# Patient Record
Sex: Male | Born: 1970 | Race: Black or African American | Hispanic: No | Marital: Single | Smoking: Former smoker
Health system: Southern US, Community
[De-identification: ages and names within clinical notes are randomized; demographics above are authoritative.]

## PROBLEM LIST (undated history)

## (undated) DIAGNOSIS — K7689 Other specified diseases of liver: Secondary | ICD-10-CM

## (undated) DIAGNOSIS — K219 Gastro-esophageal reflux disease without esophagitis: Secondary | ICD-10-CM

## (undated) DIAGNOSIS — T7840XA Allergy, unspecified, initial encounter: Secondary | ICD-10-CM

## (undated) DIAGNOSIS — K31A0 Gastric intestinal metaplasia, unspecified: Secondary | ICD-10-CM

## (undated) DIAGNOSIS — I10 Essential (primary) hypertension: Secondary | ICD-10-CM

## (undated) DIAGNOSIS — K3189 Other diseases of stomach and duodenum: Secondary | ICD-10-CM

## (undated) DIAGNOSIS — I8500 Esophageal varices without bleeding: Secondary | ICD-10-CM

## (undated) HISTORY — DX: Esophageal varices without bleeding: I85.00

## (undated) HISTORY — PX: HERNIA REPAIR: SHX51

## (undated) HISTORY — DX: Essential (primary) hypertension: I10

## (undated) HISTORY — DX: Other diseases of stomach and duodenum: K31.89

## (undated) HISTORY — DX: Other specified diseases of liver: K76.89

## (undated) HISTORY — PX: COLONOSCOPY: SHX174

## (undated) HISTORY — DX: Allergy, unspecified, initial encounter: T78.40XA

## (undated) HISTORY — DX: Gastric intestinal metaplasia, unspecified: K31.A0

## (undated) HISTORY — PX: ESOPHAGOGASTRODUODENOSCOPY: SHX1529

---

## 2007-08-05 ENCOUNTER — Emergency Department (HOSPITAL_COMMUNITY): Admission: EM | Admit: 2007-08-05 | Discharge: 2007-08-05 | Payer: Self-pay | Admitting: Emergency Medicine

## 2008-07-13 ENCOUNTER — Emergency Department: Payer: Self-pay | Admitting: Emergency Medicine

## 2008-07-16 ENCOUNTER — Emergency Department: Payer: Self-pay

## 2008-09-11 ENCOUNTER — Emergency Department: Payer: Self-pay | Admitting: Internal Medicine

## 2009-05-28 ENCOUNTER — Emergency Department (HOSPITAL_COMMUNITY): Admission: EM | Admit: 2009-05-28 | Discharge: 2009-05-28 | Payer: Self-pay | Admitting: Emergency Medicine

## 2009-10-18 ENCOUNTER — Emergency Department (HOSPITAL_COMMUNITY): Admission: EM | Admit: 2009-10-18 | Discharge: 2009-10-19 | Payer: Self-pay | Admitting: Emergency Medicine

## 2010-10-27 LAB — POCT CARDIAC MARKERS: Myoglobin, poc: 67.3 ng/mL (ref 12–200)

## 2010-10-27 LAB — CBC
Hemoglobin: 15.5 g/dL (ref 13.0–17.0)
MCHC: 33 g/dL (ref 30.0–36.0)
Platelets: 578 10*3/uL — ABNORMAL HIGH (ref 150–400)
RBC: 5.01 MIL/uL (ref 4.22–5.81)

## 2010-10-27 LAB — COMPREHENSIVE METABOLIC PANEL
ALT: 57 U/L — ABNORMAL HIGH (ref 0–53)
Albumin: 4.1 g/dL (ref 3.5–5.2)
CO2: 27 mEq/L (ref 19–32)
Calcium: 9.5 mg/dL (ref 8.4–10.5)
Creatinine, Ser: 1.04 mg/dL (ref 0.4–1.5)
GFR calc Af Amer: >60 mL/min (ref >60)
GFR calc non Af Amer: >60 mL/min (ref >60)
Glucose, Bld: 97 mg/dL (ref 70–99)
Potassium: 3.9 mEq/L (ref 3.5–5.1)
Sodium: 138 mEq/L (ref 135–145)
Total Bilirubin: 0.8 mg/dL (ref 0.3–1.2)
Total Protein: 7.3 g/dL (ref 6.0–8.3)

## 2010-10-27 LAB — POCT I-STAT, CHEM 8
Glucose, Bld: 91 mg/dL (ref 70–99)
Hemoglobin: 16.7 g/dL (ref 13.0–17.0)
Potassium: 3.8 mEq/L (ref 3.5–5.1)
Sodium: 140 mEq/L (ref 135–145)

## 2011-04-07 ENCOUNTER — Emergency Department (HOSPITAL_COMMUNITY): Payer: Self-pay

## 2011-04-07 ENCOUNTER — Observation Stay (HOSPITAL_COMMUNITY)
Admission: EM | Admit: 2011-04-07 | Discharge: 2011-04-09 | Disposition: A | Discharge status: Home / Self Care | Payer: Self-pay | Attending: Internal Medicine | Admitting: Internal Medicine

## 2011-04-07 DIAGNOSIS — F17200 Nicotine dependence, unspecified, uncomplicated: Secondary | ICD-10-CM | POA: Insufficient documentation

## 2011-04-07 DIAGNOSIS — F1010 Alcohol abuse, uncomplicated: Secondary | ICD-10-CM | POA: Insufficient documentation

## 2011-04-07 DIAGNOSIS — R197 Diarrhea, unspecified: Secondary | ICD-10-CM | POA: Insufficient documentation

## 2011-04-07 DIAGNOSIS — F1210 Cannabis abuse, uncomplicated: Secondary | ICD-10-CM | POA: Insufficient documentation

## 2011-04-07 DIAGNOSIS — R0602 Shortness of breath: Secondary | ICD-10-CM | POA: Insufficient documentation

## 2011-04-07 DIAGNOSIS — R079 Chest pain, unspecified: Principal | ICD-10-CM | POA: Insufficient documentation

## 2011-04-07 DIAGNOSIS — Z8249 Family history of ischemic heart disease and other diseases of the circulatory system: Secondary | ICD-10-CM | POA: Insufficient documentation

## 2011-04-07 DIAGNOSIS — K219 Gastro-esophageal reflux disease without esophagitis: Secondary | ICD-10-CM | POA: Insufficient documentation

## 2011-04-07 LAB — DIFFERENTIAL
Basophils Relative: 0 % (ref 0–1)
Eosinophils Relative: 2 % (ref 0–5)
Lymphocytes Relative: 38 % (ref 12–46)
Lymphs Abs: 3.5 10*3/uL (ref 0.7–4.0)
Monocytes Absolute: 0.7 10*3/uL (ref 0.1–1.0)
Neutrophils Relative %: 52 % (ref 43–77)

## 2011-04-07 LAB — BASIC METABOLIC PANEL
BUN: 11 mg/dL (ref 6–23)
CO2: 31 mEq/L (ref 19–32)
Calcium: 9.5 mg/dL (ref 8.4–10.5)
Chloride: 101 mEq/L (ref 96–112)
GFR calc non Af Amer: >60 mL/min (ref >60)
Potassium: 3.9 mEq/L (ref 3.5–5.1)
Sodium: 138 mEq/L (ref 135–145)

## 2011-04-07 LAB — CBC
MCH: 31 pg (ref 26.0–34.0)
MCHC: 34.6 g/dL (ref 30.0–36.0)
MCV: 89.7 fL (ref 78.0–100.0)
RBC: 5.16 MIL/uL (ref 4.22–5.81)
WBC: 9.1 10*3/uL (ref 4.0–10.5)

## 2011-04-08 LAB — CK TOTAL AND CKMB (NOT AT ARMC)
CK, MB: 2.9 ng/mL (ref 0.3–4.0)
Total CK: 264 U/L — ABNORMAL HIGH (ref 7–232)

## 2011-04-08 LAB — HEPATIC FUNCTION PANEL
Albumin: 3.2 g/dL — ABNORMAL LOW (ref 3.5–5.2)
Alkaline Phosphatase: 43 U/L (ref 39–117)
Bilirubin, Direct: <0.1 mg/dL (ref 0.0–0.3)
Total Protein: 6.6 g/dL (ref 6.0–8.3)

## 2011-04-08 LAB — TROPONIN I: Troponin I: <0.3 ng/mL (ref <0.30)

## 2011-04-08 LAB — MAGNESIUM: Magnesium: 2 mg/dL (ref 1.5–2.5)

## 2011-04-08 LAB — BASIC METABOLIC PANEL
BUN: 9 mg/dL (ref 6–23)
Calcium: 9 mg/dL (ref 8.4–10.5)
Creatinine, Ser: 1.01 mg/dL (ref 0.50–1.35)
GFR calc Af Amer: >60 mL/min (ref >60)
Glucose, Bld: 94 mg/dL (ref 70–99)
Sodium: 140 mEq/L (ref 135–145)

## 2011-04-08 LAB — CARDIAC PANEL(CRET KIN+CKTOT+MB+TROPI)
Relative Index: 1.2 (ref 0.0–2.5)
Total CK: 242 U/L — ABNORMAL HIGH (ref 7–232)

## 2011-04-08 LAB — CBC
HCT: 44.6 % (ref 39.0–52.0)
MCHC: 33.6 g/dL (ref 30.0–36.0)
MCV: 90.1 fL (ref 78.0–100.0)
Platelets: 479 10*3/uL — ABNORMAL HIGH (ref 150–400)
RBC: 4.95 MIL/uL (ref 4.22–5.81)
WBC: 9.1 10*3/uL (ref 4.0–10.5)

## 2011-04-08 LAB — LIPID PANEL
Cholesterol: 173 mg/dL (ref 0–200)
Cholesterol: 177 mg/dL (ref 0–200)
HDL: 65 mg/dL (ref >39)
LDL Cholesterol: 95 mg/dL (ref 0–99)
Total CHOL/HDL Ratio: 3.1 RATIO
Triglycerides: 157 mg/dL — ABNORMAL HIGH (ref <150)
Triglycerides: 87 mg/dL (ref <150)
VLDL: 17 mg/dL (ref 0–40)

## 2011-04-08 LAB — RAPID URINE DRUG SCREEN, HOSP PERFORMED
Cocaine: NOT DETECTED
Tetrahydrocannabinol: POSITIVE — AB

## 2011-04-09 ENCOUNTER — Observation Stay (HOSPITAL_COMMUNITY): Payer: Self-pay

## 2011-04-09 MED ORDER — TECHNETIUM TC 99M TETROFOSMIN IV KIT
10.0000 | PACK | Freq: Once | INTRAVENOUS | Status: AC | PRN
  Administered 2011-04-09: 10 via INTRAVENOUS

## 2011-04-09 MED ORDER — TECHNETIUM TC 99M TETROFOSMIN IV KIT
30.0000 | PACK | Freq: Once | INTRAVENOUS | Status: AC | PRN
  Administered 2011-04-09: 30 via INTRAVENOUS

## 2011-04-09 NOTE — H&P (Signed)
NAME:  William Fuller, BISTLINE NO.:  [id number]  MEDICAL RECORD NO.:  [id number]  LOCATION:  [facility]                         FACILITY:  [facility]  PHYSICIAN:  Eduard Clos, MDDATE OF BIRTH:  1970/11/10  DATE OF ADMISSION:  04/07/2011 DATE OF DISCHARGE:                             HISTORY & PHYSICAL   PRIMARY CARE PHYSICIAN:  Unassigned.  CHIEF COMPLAINT:  Chest pain.  HISTORY OF PRESENT ILLNESS:  A 40 year old male with known significant past medical history, has had a cardiac cath in 2006, as per the patient, was normal, presenting with a complaint of chest pain.  The patient has had chest pain started today morning around 10 o'clock which was more on the right side which was radiating to his left of his chest. The chest pain initially was pressure like, later became more shooting type associated with mild shortness of breath and denies any nausea or diaphoresis.  Denies any cough or phlegm or fever or chills.  In the ER, the patient had cardiac enzymes and EKG which at this time does not show anything acute.  The patient has been admitted for further workup.  As the patient did have a cardiac cath in 2006, the patient did call a cardiologist on call, but at this time, feel the patient can be admitted by hospitalist.  The patient initially stated that he has been having some diarrhea for the last 2 days, multiple episodes.  Denies any blood in the diarrhea. Along with it, he also had some belching episodes.  No nausea or vomiting.  Denies any dizziness, loss of function, headache, visual symptoms, any focal deficit.  PAST MEDICAL HISTORY:  Nothing significant except for a cardiac cath in 2006, which the patient states was normal.  MEDICATIONS PRIOR TO ADMISSION:  Zantac.  ALLERGIES:  No known drug allergies.  FAMILY HISTORY:  Positive for his brother having MI at age 64 and his younger brother dying at age 62 from enlarged heart.  Mother had  breast cancer.  SOCIAL HISTORY:  The patient smokes cigarettes, drinks beer 2-3 cans every other day, occasionally uses marijuana.  REVIEW OF SYSTEMS:  As per the history of presenting illness, nothing else significant.  PHYSICAL EXAMINATION:  GENERAL:  The patient examined at bedside, not in acute distress. VITAL SIGNS:  Blood pressure 135/81, pulse 84 per minute, temperature 98.6, respirations 18 per minute, O2 sat 99%. HEENT:  Anicteric.  No pallor.  No discharge from ears, eyes, nose, or mouth. CHEST:  Bilateral air entry present.  No rhonchi, no crepitation. HEART:  S1, S2 heard. ABDOMEN:  Soft, nontender.  Bowel sounds heard. CNS:  The patient is alert, awake, oriented to time, place, and person. Moves upper and lower extremities, 5/5. EXTREMITIES:  Peripheral pulses felt.  No edema.  LABORATORY DATA:  EKG shows normal sinus rhythm with nonspecific ST-T changes which is comparable to the old EKG which was done on October 18, 2009, heart rate is around 66 beats per minute.  Chest x-ray shows no active disease.  CBC; WBC is 9.1, hemoglobin is 16, hematocrit is 46.3, platelets 198.  Basic metabolic panel; sodium 138, potassium 3.9, chloride 101, carbon dioxide 31, glucose 96, BUN  11, creatinine 0.9, calcium 9.5.  Troponin I 0.01.  ASSESSMENT: 1. Chest pain, rule out acute coronary syndrome. 2. Diarrhea. 3. Tobacco abuse and alcohol abuse and marijuana abuse. 4. History of cardiac catheterization in 2006, which was normal as per     the patient.  PLAN: 1. At this time, we will admit the patient to telemetry. 2. For his chest pain, at this time, the patient is chest pain free.     We will keep the patient on p.r.n. nitroglycerin and continue with     aspirin and Protonix.  I am going to check a lipase and a D-dimer     along with LFTs.  We will get a 2-D echo and further recommendation     based on test order and clinical course.     Eduard Clos,  MD     ANK/MEDQ  D:  04/07/2011  T:  04/07/2011  Job:  [id number]  Electronically Signed by Midge Minium MD on 04/09/2011 06:51:33 AM

## 2011-04-12 NOTE — Discharge Summary (Signed)
  NAMEJONATHEN, RATHMAN             ACCOUNT NO.:  [id number]  MEDICAL RECORD NO.:  [id number]  LOCATION:  2028                         FACILITY:  [facility]  PHYSICIAN:  Lonia Blood, M.D.       DATE OF BIRTH:  [birth year]  DATE OF ADMISSION:  04/07/2011 DATE OF DISCHARGE:  04/09/2011                              DISCHARGE SUMMARY   PRIMARY CARE PHYSICIAN:  This patient has been referred to [facility].  DISCHARGE DIAGNOSES: 1. Chest pain, probably musculoskeletal versus gastroesophageal reflux     disease, resolved. 2. Reported history of coronary artery disease - current Myoview test     was negative for inducible ischemia. 3. Gastroesophageal reflux disease. 4. Tobacco abuse.  DISCHARGE MEDICATIONS: 1. Ranitidine 150 mg daily. 2. Multivitamin one daily.  CONDITION ON DISCHARGE:  Mr. Kresse was discharged in good condition, alert, oriented, and in no acute distress, chest pain free.  He will follow up with [facility].  PROCEDURE DURING THIS ADMISSION:  The patient underwent Myoview stress test which showed no inducible ischemia.  CONSULTATION DURING THIS ADMISSION:  The patient was in consultation by [facility].  HISTORY AND PHYSICAL:  Refer to dictated H and P done by Dr. Toniann Fail.  HOSPITAL COURSE:  Mr. Oommen is a 40 year old gentleman with reported personal history of coronary artery disease who presented to the emergency room with some atypical chest pain.  He was placed on observation.  He had telemetry monitoring, and cardiac enzyme checking. There was no evidence for myocardial infarction.  The chest pain resolved.  He was seen in consultation on April 08, 2011, by [facility] and later on on April 09, 2011, underwent a Myoview stress test.  The results of the Myoview stress test were negative for inducible ischemia with a measured ejection fraction of 50%.  The patient's chest pain could  probably be attributed to gastroesophageal reflux disease, and he was later discharged home in good condition with primary care followup at [facility].     Lonia Blood, M.D.     SL/MEDQ  D:  04/10/2011  T:  04/10/2011  Job:  [id number]  Electronically Signed by Lonia Blood M.D. on 04/12/2011 03:26:13 PM

## 2011-04-18 NOTE — Consult Note (Signed)
NAME:  William Fuller, William Fuller NO.:  [id number]  MEDICAL RECORD NO.:  [id number]  LOCATION:                                 FACILITY:  PHYSICIAN:  Landry Corporal, MDDATE OF BIRTH:  12/06/1970  DATE OF CONSULTATION: DATE OF DISCHARGE:                                CONSULTATION   CHIEF COMPLAINT:  Chest pain.  HISTORY OF PRESENT ILLNESS:  William Fuller is a 40 year old male who moved to the [place] area in 2008 from [place].  In 2006 in [place], he was admitted to an [facility] with chest pain.  According to the patient's history, he did have a heart attack, probably by enzymes.  He said he was seen by cardiologist and had a catheterization but no angioplasty or stents were placed.  He was told that the catheterization was "okay."  He was put on medication including Plavix after his heart catheterization.  He followed up with cardiologist a couple of times in [place] since then.  He was also admitted once or twice to the emergency room in [place] with palpitations.  He was not kept overnight then.  He moved to [facility] in 2008 and has not seen a physician regularly.  He is not on medications now.  He has been doing well from cardiac standpoint until yesterday morning when he got up to go to work and noted some chest tightness.  He took a Zantac without relief.  He then had some sharp left-sided chest pain.  He came to the emergency room and was admitted by the hospital service for further evaluation.  His EKG showed no acute changes, and initial enzymes were negative.  We were asked to see him for consult and further evaluation. He is currently pain free.  PAST MEDICAL HISTORY:  Unremarkable for other serious medical problems, he was put on a statin in 2006 but he does not take that now.  He has no known drug allergies.  SOCIAL HISTORY:  He is single.  He lives alone.  He smokes less than half a pack a day.  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  He drinks alcohol a couple of times a week, consisting of beer.  He does smoke occasional marijuana but denies cocaine use.  FAMILY HISTORY:  Remarkable that his brother had an MI in his early 30s and had a stent placed twice to the "left side of his heart."  His brother is still alive.  His mother has a history of breast cancer.  REVIEW OF SYSTEMS:  Essentially unremarkable except for noted above, he has had some diarrhea but no fever or chills.  He does note that when he had his catheterization in 2006 that the cardiologist told him it is possible that his heart attack was from "stress."  He denies being under any undue stress at this time.  PHYSICAL EXAMINATION:  VITAL SIGNS:  Blood pressure 122/66, pulse 52, temperature 98. GENERAL:  He is a well-developed, well-nourished [place] male, in no acute distress. HEENT:  Normocephalic, atraumatic.  Extraocular movements are intact. Sclerae nonicteric.  Conjunctivae within normal limits. NECK:  Without JVD or bruit. CHEST:  Clear to auscultation and percussion. CARDIAC:  Regular rate and rhythm without obvious murmur, rub, or gallop.  Normal S1 and S2. ABDOMEN:  Nontender.  No hepatosplenomegaly.  No bruits. EXTREMITIES:  Without edema.  Distal pulses are 3+/4 bilaterally. NEUROLOGIC:  Grossly intact.  He is awake, alert, and oriented and cooperative, moves all extremities without obvious deficit. SKIN:  Cool and dry.  LABORATORY DATA:  EKG shows sinus rhythm with sinus brady without any acute changes. White count 9.1, hemoglobin 15, hematocrit 44.6, platelets 479.  Sodium 140, potassium 3.7, BUN 9, creatinine 1.01.  Troponin is negative x3. Cholesterol total was 177 with an HDL of 65 and an LDL of 95.  LFTs are normal.  His D-dimer is normal.  IMPRESSION: 1. Chest pain, worrisome for unstable angina. 2. History of myocardial infarction in 2006, possibly secondary to     coronary  spasm with apparently no significant coronary disease at     catheterization at that time. 3. Strong family history of coronary disease with sibling who has had     two left anterior descending stents placed in his 69s. 4. History of smoking, less than half pack a day.  PLAN:  The patient will be evaluated by cardiologist further today and recommendations are to follow regarding exercise Myoview versus diagnostic catheterization.     William Fuller, P.A.   ______________________________ Landry Corporal, MD    LKK/MEDQ  D:  04/08/2011  T:  04/08/2011  Job:  [id number]  Electronically Signed by Corine Shelter P.A. on 04/10/2011 04:53:09 PM Electronically Signed by Bryan Lemma MD on 04/18/2011 02:20:25 PM

## 2012-08-18 ENCOUNTER — Emergency Department (HOSPITAL_COMMUNITY)
Admission: EM | Admit: 2012-08-18 | Discharge: 2012-08-18 | Disposition: A | Discharge status: Home / Self Care | Payer: Self-pay | Attending: Emergency Medicine | Admitting: Emergency Medicine

## 2012-08-18 ENCOUNTER — Encounter (HOSPITAL_COMMUNITY): Payer: Self-pay | Admitting: Emergency Medicine

## 2012-08-18 DIAGNOSIS — F17200 Nicotine dependence, unspecified, uncomplicated: Secondary | ICD-10-CM | POA: Insufficient documentation

## 2012-08-18 DIAGNOSIS — K0889 Other specified disorders of teeth and supporting structures: Secondary | ICD-10-CM

## 2012-08-18 DIAGNOSIS — K219 Gastro-esophageal reflux disease without esophagitis: Secondary | ICD-10-CM | POA: Insufficient documentation

## 2012-08-18 DIAGNOSIS — K089 Disorder of teeth and supporting structures, unspecified: Secondary | ICD-10-CM | POA: Insufficient documentation

## 2012-08-18 HISTORY — DX: Gastro-esophageal reflux disease without esophagitis: K21.9

## 2012-08-18 MED ORDER — OXYCODONE-ACETAMINOPHEN 5-325 MG PO TABS
1.0000 | ORAL_TABLET | Freq: Once | ORAL | Status: AC
  Administered 2012-08-18: 1 via ORAL
  Filled 2012-08-18: qty 1

## 2012-08-18 MED ORDER — PERCOCET 5-325 MG PO TABS: 1.0000 | ORAL_TABLET | Freq: Four times a day (QID) | ORAL | Status: DC | PRN

## 2012-08-18 NOTE — ED Provider Notes (Signed)
Medical screening examination/treatment/procedure(s) were performed by non-physician practitioner and as supervising physician I was immediately available for consultation/collaboration.  Jasmine Awe, MD 08/18/12 [phone]

## 2012-08-18 NOTE — ED Notes (Signed)
Pt c/o tooth pain on left side.  Not sure which tooth is causing the pain.

## 2012-08-18 NOTE — ED Notes (Signed)
Pt discharged.Vital signs stable .GCS 15 

## 2012-08-18 NOTE — ED Provider Notes (Signed)
History     CSN: [id number]  Arrival date & time 08/18/12  1944   First MD Initiated Contact with Patient 08/18/12 2013      Chief Complaint  Patient presents with  . Dental Pain    (Consider location/radiation/quality/duration/timing/severity/associated sxs/prior treatment) HPI Comments: Patient presents to the emergency department with a dental complaint. Symptoms began 2-3 days ago. The patient has tried to alleviate pain with motrin.  Pain rated at a 10/10, characterized as throbbing in nature and located left side of mouth (unable to tell if upper or lower). Patient denies fever, night sweats, chills, difficulty swallowing or opening mouth, SOB, nuchal rigidity or decreased ROM of neck.  Patient does not have a dentist and requests a resource guide at discharge.   The history is provided by the patient.    Past Medical History  Diagnosis Date  . GERD (gastroesophageal reflux disease)     History reviewed. No pertinent past surgical history.  History reviewed. No pertinent family history.  History  Substance Use Topics  . Smoking status: Current Every Day Smoker  . Smokeless tobacco: Not on file  . Alcohol Use: Yes     Comment: occasional      Review of Systems  All other systems reviewed and are negative.    Allergies  Review of patient's allergies indicates no known allergies.  Home Medications   Current Outpatient Rx  Name  Route  Sig  Dispense  Refill  . BC HEADACHE POWDER PO   Oral   Take 1 packet by mouth 2 (two) times daily as needed. For headache         . OVER THE COUNTER MEDICATION   Oral   Take 1 tablet by mouth daily. Vitamin for prostrate health         . RANITIDINE HCL 150 MG PO TABS   Oral   Take 150 mg by mouth 2 (two) times daily as needed. For acid reflux           BP 136/82  Pulse 68  Temp 98.7 F (37.1 C) (Oral)  Resp 14  SpO2 96%  Physical Exam  Nursing note and vitals reviewed. Constitutional: He is oriented to  person, place, and time. He appears well-developed and well-nourished. No distress.  HENT:  Head: Normocephalic and atraumatic. No trismus in the jaw.  Mouth/Throat: Uvula is midline, oropharynx is clear and moist and mucous membranes are normal. No dental abscesses or uvula swelling. No oropharyngeal exudate, posterior oropharyngeal edema, posterior oropharyngeal erythema or tonsillar abscesses.       Good dental hygiene. Pt able to open and close mouth with out difficulty. Airway intact. Uvula midline. No gingival swelling with tenderness over right upper and lower molars, but no gingival fluctuance. No swelling or tenderness of submental and submandibular regions.  Eyes: Conjunctivae normal and EOM are normal.  Neck: Normal range of motion and full passive range of motion without pain. Neck supple.  Cardiovascular: Normal rate and regular rhythm.   Pulmonary/Chest: Effort normal and breath sounds normal. No stridor. No respiratory distress. He has no wheezes.  Musculoskeletal: Normal range of motion.  Lymphadenopathy:       Head (right side): No submental, no submandibular, no tonsillar, no preauricular and no posterior auricular adenopathy present.       Head (left side): No submental, no submandibular, no tonsillar, no preauricular and no posterior auricular adenopathy present.    He has no cervical adenopathy.  Neurological: He is alert and  oriented to person, place, and time.  Skin: Skin is warm and dry. No rash noted. He is not diaphoretic.    ED Course  Procedures (including critical care time)  Labs Reviewed - No data to display No results found.   No diagnosis found.    MDM  Dental pain Patient with toothache.  No gross abscess no evidence of exposed pulp, abx not indicated at this time.  Exam unconcerning for Ludwig's angina or spread of infection.  Will treat with d pain medicine.  Urged patient to follow-up with dentist.  Strict return precautions discussed.           Jaci Carrel, [place] 08/18/12 2127

## 2012-09-29 ENCOUNTER — Encounter (HOSPITAL_COMMUNITY): Payer: Self-pay | Admitting: Emergency Medicine

## 2012-09-29 ENCOUNTER — Emergency Department (HOSPITAL_COMMUNITY)
Admission: EM | Admit: 2012-09-29 | Discharge: 2012-09-29 | Disposition: A | Discharge status: Home / Self Care | Payer: Self-pay | Attending: Emergency Medicine | Admitting: Emergency Medicine

## 2012-09-29 DIAGNOSIS — K219 Gastro-esophageal reflux disease without esophagitis: Secondary | ICD-10-CM | POA: Insufficient documentation

## 2012-09-29 DIAGNOSIS — F17200 Nicotine dependence, unspecified, uncomplicated: Secondary | ICD-10-CM | POA: Insufficient documentation

## 2012-09-29 DIAGNOSIS — Z79899 Other long term (current) drug therapy: Secondary | ICD-10-CM | POA: Insufficient documentation

## 2012-09-29 DIAGNOSIS — K089 Disorder of teeth and supporting structures, unspecified: Secondary | ICD-10-CM | POA: Insufficient documentation

## 2012-09-29 DIAGNOSIS — K0889 Other specified disorders of teeth and supporting structures: Secondary | ICD-10-CM

## 2012-09-29 MED ORDER — OXYCODONE-ACETAMINOPHEN 5-325 MG PO TABS
2.0000 | ORAL_TABLET | Freq: Once | ORAL | Status: AC
  Administered 2012-09-29: 2 via ORAL
  Filled 2012-09-29: qty 2

## 2012-09-29 MED ORDER — OXYCODONE-ACETAMINOPHEN 5-325 MG PO TABS: 1.0000 | ORAL_TABLET | Freq: Four times a day (QID) | ORAL | Status: DC | PRN

## 2012-09-29 MED ORDER — ONDANSETRON 4 MG PO TBDP
4.0000 mg | ORAL_TABLET | Freq: Once | ORAL | Status: AC
  Administered 2012-09-29: 4 mg via ORAL
  Filled 2012-09-29: qty 1

## 2012-09-29 MED ORDER — AMOXICILLIN 500 MG PO CAPS: 500.0000 mg | ORAL_CAPSULE | Freq: Three times a day (TID) | ORAL | Status: DC

## 2012-09-29 MED ORDER — ONDANSETRON HCL 4 MG PO TABS: 4.0000 mg | ORAL_TABLET | Freq: Four times a day (QID) | ORAL | Status: DC

## 2012-09-29 MED ORDER — AMOXICILLIN 500 MG PO CAPS
500.0000 mg | ORAL_CAPSULE | Freq: Once | ORAL | Status: AC
  Administered 2012-09-29: 500 mg via ORAL
  Filled 2012-09-29: qty 1

## 2012-09-29 NOTE — ED Provider Notes (Signed)
History     CSN: [id number]  Arrival date & time 09/29/12  0234   First MD Initiated Contact with Patient 09/29/12 [phone]      Chief Complaint  Patient presents with  . Dental Pain    (Consider location/radiation/quality/duration/timing/severity/associated sxs/prior treatment) HPI  Patient presents to the emergency department with a dental complaint. Symptoms began 4-5 days ago but he has been unable to sleep the past two days because of the severe pain to his left upper and bottom back molar. The patient has tried to alleviate pain with motrin. Pain rated at a 10/10, characterized as throbbing in nature and located left side of mouth (unable to tell if upper or lower). Patient denies fever, night sweats, chills, difficulty swallowing or opening mouth, SOB, nuchal rigidity or decreased ROM of neck. Patient does not have a dentist and requests a resource guide at discharge.   Past Medical History  Diagnosis Date  . GERD (gastroesophageal reflux disease)     History reviewed. No pertinent past surgical history.  Family History  Problem Relation Age of Onset  . Hypertension Other   . CAD Other     History  Substance Use Topics  . Smoking status: Current Every Day Smoker  . Smokeless tobacco: Not on file  . Alcohol Use: No      Review of Systems  All other systems reviewed and are negative.    Allergies  Review of patient's allergies indicates no known allergies.  Home Medications   Current Outpatient Rx  Name  Route  Sig  Dispense  Refill  . amoxicillin (AMOXIL) 500 MG capsule   Oral   Take 1 capsule (500 mg total) by mouth 3 (three) times daily.   21 capsule   0   . Aspirin-Salicylamide-Caffeine (BC HEADACHE POWDER PO)   Oral   Take 1 packet by mouth 2 (two) times daily as needed. For headache         . ondansetron (ZOFRAN) 4 MG tablet   Oral   Take 1 tablet (4 mg total) by mouth every 6 (six) hours.   12 tablet   0   . OVER THE COUNTER MEDICATION    Oral   Take 1 tablet by mouth daily. Vitamin for prostrate health         . oxyCODONE-acetaminophen (PERCOCET/ROXICET) 5-325 MG per tablet   Oral   Take 1 tablet by mouth every 6 (six) hours as needed for pain.   15 tablet   0   . PERCOCET 5-325 MG per tablet   Oral   Take 1 tablet by mouth every 6 (six) hours as needed for pain.   15 tablet   0     Dispense as written.   . ranitidine (ZANTAC) 150 MG tablet   Oral   Take 150 mg by mouth 2 (two) times daily as needed. For acid reflux           BP 134/88  Pulse 64  Temp(Src) 99.5 F (37.5 C) (Oral)  Resp 20  SpO2 99%  Physical Exam  Nursing note and vitals reviewed. Constitutional: He appears well-developed and well-nourished.  HENT:  Head: Normocephalic and atraumatic.  Good dental hygiene. Pt able to open and close mouth with out difficulty. Airway intact. Uvula midline. No gingival swelling with tenderness over right upper and lower molars, but no gingival fluctuance. No swelling or tenderness of submental and submandibular regions.    Eyes: Conjunctivae and EOM are normal. Pupils are equal,  round, and reactive to light.  Neck: Normal range of motion. Neck supple.  Cardiovascular: Normal rate and regular rhythm.   Pulmonary/Chest: Effort normal and breath sounds normal.    ED Course  Procedures (including critical care time)  Labs Reviewed - No data to display No results found.   1. Pain, dental       MDM  Dental pain  Patient with toothache. No gross abscess no evidence of exposed pulp, abx not indicated at this time. Exam unconcerning for Ludwig's angina or spread of infection. Will treat with d pain medicine. Urged patient to follow-up with dentist. Strict return precautions discussed.   Rx abx and pain medication  Pt has been advised of the symptoms that warrant their return to the ED. Patient has voiced understanding and has agreed to follow-up with the PCP or  specialist.         Dorthula Matas, PA 09/29/12 [phone]

## 2012-09-29 NOTE — ED Notes (Signed)
Pt is c/o toothache on the left side on the top and bottom  States this is the second day of pain

## 2012-09-29 NOTE — ED Provider Notes (Signed)
Medical screening examination/treatment/procedure(s) were performed by non-physician practitioner and as supervising physician I was immediately available for consultation/collaboration.  Olivia Mackie, MD 09/29/12 [phone]

## 2012-10-04 ENCOUNTER — Telehealth (HOSPITAL_COMMUNITY): Payer: Self-pay | Admitting: Emergency Medicine

## 2012-10-04 NOTE — ED Notes (Signed)
On call dentist calling for contact info on pt.  Home # provided.

## 2015-02-22 ENCOUNTER — Encounter (HOSPITAL_COMMUNITY): Payer: Self-pay | Admitting: *Deleted

## 2015-02-22 ENCOUNTER — Emergency Department (HOSPITAL_COMMUNITY)
Admission: EM | Admit: 2015-02-22 | Discharge: 2015-02-22 | Disposition: A | Discharge status: Home / Self Care | Payer: Self-pay | Attending: Emergency Medicine | Admitting: Emergency Medicine

## 2015-02-22 DIAGNOSIS — K921 Melena: Secondary | ICD-10-CM

## 2015-02-22 DIAGNOSIS — R195 Other fecal abnormalities: Secondary | ICD-10-CM | POA: Insufficient documentation

## 2015-02-22 DIAGNOSIS — Z720 Tobacco use: Secondary | ICD-10-CM | POA: Insufficient documentation

## 2015-02-22 DIAGNOSIS — Z79899 Other long term (current) drug therapy: Secondary | ICD-10-CM | POA: Insufficient documentation

## 2015-02-22 DIAGNOSIS — Z7982 Long term (current) use of aspirin: Secondary | ICD-10-CM | POA: Insufficient documentation

## 2015-02-22 DIAGNOSIS — K219 Gastro-esophageal reflux disease without esophagitis: Secondary | ICD-10-CM | POA: Insufficient documentation

## 2015-02-22 LAB — URINALYSIS, ROUTINE W REFLEX MICROSCOPIC
Bilirubin Urine: NEGATIVE
Glucose, UA: NEGATIVE mg/dL
Hgb urine dipstick: NEGATIVE
Ketones, ur: NEGATIVE mg/dL
Leukocytes, UA: NEGATIVE
Nitrite: NEGATIVE
Protein, ur: NEGATIVE mg/dL
Specific Gravity, Urine: 1.029 (ref 1.005–1.030)
Urobilinogen, UA: 1 mg/dL (ref 0.0–1.0)
pH: 6 (ref 5.0–8.0)

## 2015-02-22 LAB — COMPREHENSIVE METABOLIC PANEL
ALT: 23 U/L (ref 17–63)
AST: 29 U/L (ref 15–41)
Albumin: 3.7 g/dL (ref 3.5–5.0)
Alkaline Phosphatase: 40 U/L (ref 38–126)
Anion gap: 4 — ABNORMAL LOW (ref 5–15)
BUN: 9 mg/dL (ref 6–20)
CO2: 27 mmol/L (ref 22–32)
Calcium: 8.8 mg/dL — ABNORMAL LOW (ref 8.9–10.3)
Chloride: 108 mmol/L (ref 101–111)
Creatinine, Ser: 1.12 mg/dL (ref 0.61–1.24)
GFR calc Af Amer: >60 mL/min (ref >60)
GFR calc non Af Amer: >60 mL/min (ref >60)
Glucose, Bld: 99 mg/dL (ref 65–99)
Potassium: 4 mmol/L (ref 3.5–5.1)
Sodium: 139 mmol/L (ref 135–145)
Total Bilirubin: 0.6 mg/dL (ref 0.3–1.2)
Total Protein: 6.8 g/dL (ref 6.5–8.1)

## 2015-02-22 LAB — CBC WITH DIFFERENTIAL/PLATELET
Basophils Absolute: 0 10*3/uL (ref 0.0–0.1)
Basophils Relative: 1 % (ref 0–1)
Eosinophils Absolute: 0.1 10*3/uL (ref 0.0–0.7)
Eosinophils Relative: 2 % (ref 0–5)
HCT: 41.9 % (ref 39.0–52.0)
Hemoglobin: 14 g/dL (ref 13.0–17.0)
Lymphocytes Relative: 41 % (ref 12–46)
Lymphs Abs: 2.4 10*3/uL (ref 0.7–4.0)
MCH: 30 pg (ref 26.0–34.0)
MCHC: 33.4 g/dL (ref 30.0–36.0)
MCV: 89.7 fL (ref 78.0–100.0)
Monocytes Absolute: 0.5 10*3/uL (ref 0.1–1.0)
Monocytes Relative: 8 % (ref 3–12)
Neutro Abs: 2.9 10*3/uL (ref 1.7–7.7)
Neutrophils Relative %: 48 % (ref 43–77)
Platelets: 478 10*3/uL — ABNORMAL HIGH (ref 150–400)
RBC: 4.67 MIL/uL (ref 4.22–5.81)
RDW: 13.6 % (ref 11.5–15.5)
WBC: 5.9 10*3/uL (ref 4.0–10.5)

## 2015-02-22 LAB — LIPASE, BLOOD: Lipase: 17 U/L — ABNORMAL LOW (ref 22–51)

## 2015-02-22 LAB — POC OCCULT BLOOD, ED: Fecal Occult Bld: NEGATIVE

## 2015-02-22 MED ORDER — SODIUM CHLORIDE 0.9 % IV BOLUS (SEPSIS)
1000.0000 mL | Freq: Once | INTRAVENOUS | Status: AC
  Administered 2015-02-22: 1000 mL via INTRAVENOUS

## 2015-02-22 NOTE — ED Notes (Signed)
Pt reports blood in stool for 2 days prior to today. None noted today. Pt states that he has had hemorrhoids in the past. Reports some abdominal pain.

## 2015-02-22 NOTE — Discharge Instructions (Signed)
Return here as needed.  Follow-up with the GI Dr. provided °

## 2015-02-22 NOTE — ED Provider Notes (Signed)
CSN: [id number]     Arrival date & time 02/22/15  1009 History   First MD Initiated Contact with Patient 02/22/15 1020     Chief Complaint  Patient presents with  . Rectal Bleeding     (Consider location/radiation/quality/duration/timing/severity/associated sxs/prior Treatment) HPI Patient presents to the emergency department with blood in his stool over the last 2 days.  The patient states that he had a bowel movement 2 days ago that had some blood mixed in the stool.  Patient states that today.  Yesterday he noticed some as well, but today did not notice any patient states he has not had any abdominal pain, nausea, vomiting, weakness, dizziness, headache, blurred vision, back pain, neck pain, fever, cough, runny nose, sore throat, dysuria, incontinence, diarrhea, or syncope.  The patient states that he is had bleeding in his stool.  Previously, but not any significant abnormality of these were episodes associated with hemorrhoids Past Medical History  Diagnosis Date  . GERD (gastroesophageal reflux disease)    History reviewed. No pertinent past surgical history. Family History  Problem Relation Age of Onset  . Hypertension Other   . CAD Other    History  Substance Use Topics  . Smoking status: Current Every Day Smoker  . Smokeless tobacco: Not on file  . Alcohol Use: No    Review of Systems  All other systems negative except as documented in the HPI. All pertinent positives and negatives as reviewed in the HPI.=  Allergies  Review of patient's allergies indicates no known allergies.  Home Medications   Prior to Admission medications   Medication Sig Start Date End Date Taking? Authorizing Provider  Aspirin-Salicylamide-Caffeine (BC HEADACHE POWDER PO) Take 1 packet by mouth 2 (two) times daily as needed. For headache   Yes Historical Provider, MD  ranitidine (ZANTAC) 150 MG tablet Take 150 mg by mouth 2 (two) times daily as needed. For acid reflux   Yes Historical  Provider, MD  amoxicillin (AMOXIL) 500 MG capsule Take 1 capsule (500 mg total) by mouth 3 (three) times daily. Patient not taking: Reported on 02/22/2015 09/29/12   Delos Haring, PA-C  ondansetron (ZOFRAN) 4 MG tablet Take 1 tablet (4 mg total) by mouth every 6 (six) hours. Patient not taking: Reported on 02/22/2015 09/29/12   Delos Haring, PA-C  oxyCODONE-acetaminophen (PERCOCET/ROXICET) 5-325 MG per tablet Take 1 tablet by mouth every 6 (six) hours as needed for pain. Patient not taking: Reported on 02/22/2015 09/29/12   Delos Haring, PA-C  PERCOCET 5-325 MG per tablet Take 1 tablet by mouth every 6 (six) hours as needed for pain. Patient not taking: Reported on 02/22/2015 08/18/12   Lisette Paz, PA-C   BP 113/80 mmHg  Pulse 46  Temp(Src) 98.5 F (36.9 C) (Oral)  Resp 16  SpO2 100% Physical Exam  Constitutional: He is oriented to person, place, and time. He appears well-developed and well-nourished. No distress.  HENT:  Head: Normocephalic and atraumatic.  Mouth/Throat: Oropharynx is clear and moist.  Eyes: Pupils are equal, round, and reactive to light.  Neck: Normal range of motion. Neck supple.  Cardiovascular: Normal rate, regular rhythm and normal heart sounds.  Exam reveals no gallop and no friction rub.   No murmur heard. Pulmonary/Chest: Effort normal and breath sounds normal. No respiratory distress.  Abdominal: Soft. Bowel sounds are normal. He exhibits no distension. There is no tenderness.  Genitourinary: Rectal exam shows no external hemorrhoid, no internal hemorrhoid, no fissure, no mass, no tenderness and anal tone  normal. Guaiac negative stool.  Neurological: He is alert and oriented to person, place, and time. He exhibits normal muscle tone. Coordination normal.  Skin: Skin is warm and dry. No rash noted. No erythema.  Psychiatric: He has a normal mood and affect. His behavior is normal.  Nursing note and vitals reviewed.   ED Course  Procedures (including  critical care time) Labs Review Labs Reviewed  COMPREHENSIVE METABOLIC PANEL - Abnormal; Notable for the following:    Calcium 8.8 (*)    Anion gap 4 (*)    All other components within normal limits  CBC WITH DIFFERENTIAL/PLATELET - Abnormal; Notable for the following:    Platelets 478 (*)    All other components within normal limits  LIPASE, BLOOD - Abnormal; Notable for the following:    Lipase 17 (*)    All other components within normal limits  URINALYSIS, ROUTINE W REFLEX MICROSCOPIC (NOT AT [facility])  POC OCCULT BLOOD, ED    Imaging Review No results found.  The patient will be referred to GI for further evaluation and care of advised him of the results and all questions were answered.  Patient does not have a low hemoglobin or blood pressures.  The patient does not have any positive blood in his stool    Dalia Heading, PA-C 02/22/15 1443  Dorie Rank, MD 02/24/15 [phone]

## 2015-03-21 ENCOUNTER — Emergency Department (HOSPITAL_COMMUNITY)
Admission: EM | Admit: 2015-03-21 | Discharge: 2015-03-21 | Disposition: A | Discharge status: Home / Self Care | Payer: Self-pay | Attending: Emergency Medicine | Admitting: Emergency Medicine

## 2015-03-21 ENCOUNTER — Encounter (HOSPITAL_COMMUNITY): Payer: Self-pay | Admitting: *Deleted

## 2015-03-21 DIAGNOSIS — K219 Gastro-esophageal reflux disease without esophagitis: Secondary | ICD-10-CM | POA: Insufficient documentation

## 2015-03-21 DIAGNOSIS — M25511 Pain in right shoulder: Secondary | ICD-10-CM | POA: Insufficient documentation

## 2015-03-21 DIAGNOSIS — Z720 Tobacco use: Secondary | ICD-10-CM | POA: Insufficient documentation

## 2015-03-21 MED ORDER — HYDROCODONE-ACETAMINOPHEN 5-325 MG PO TABS
2.0000 | ORAL_TABLET | Freq: Once | ORAL | Status: AC
  Administered 2015-03-21: 2 via ORAL
  Filled 2015-03-21: qty 2

## 2015-03-21 MED ORDER — IBUPROFEN 800 MG PO TABS: 800.0000 mg | ORAL_TABLET | Freq: Three times a day (TID) | ORAL | Status: DC | PRN

## 2015-03-21 MED ORDER — HYDROCODONE-ACETAMINOPHEN 5-325 MG PO TABS: 1.0000 | ORAL_TABLET | Freq: Four times a day (QID) | ORAL | Status: DC | PRN

## 2015-03-21 MED ORDER — IBUPROFEN 800 MG PO TABS
800.0000 mg | ORAL_TABLET | Freq: Once | ORAL | Status: AC
  Administered 2015-03-21: 800 mg via ORAL
  Filled 2015-03-21: qty 1

## 2015-03-21 NOTE — ED Notes (Signed)
No recent injury.

## 2015-03-21 NOTE — Discharge Instructions (Signed)
Rotator Cuff Tendinitis  Rotator cuff tendinitis is inflammation of the tough, cord-like bands that connect muscle to bone (tendons) in your rotator cuff. Your rotator cuff is the collection of all the muscles and tendons that connect your arm to your shoulder. Your rotator cuff holds the head of your upper arm bone (humerus) in the cup (fossa) of your shoulder blade (scapula). CAUSES Rotator cuff tendinitis is usually caused by overusing the joint involved.  SIGNS AND SYMPTOMS  Deep ache in the shoulder also felt on the outside upper arm over the shoulder muscle.  Point tenderness over the area that is injured.  Pain comes on gradually and becomes worse with lifting the arm to the side (abduction) or turning it inward (internal rotation).  May lead to a chronic tear: When a rotator cuff tendon becomes inflamed, it runs the risk of losing its blood supply, causing some tendon fibers to die. This increases the risk that the tendon can fray and partially or completely tear. DIAGNOSIS Rotator cuff tendinitis is diagnosed by taking a medical history, performing a physical exam, and reviewing results of imaging exams. The medical history is useful to help determine the type of rotator cuff injury. The physical exam will include looking at the injured shoulder, feeling the injured area, and watching you do range-of-motion exercises. X-ray exams are typically done to rule out other causes of shoulder pain, such as fractures. MRI is the imaging exam usually used for significant shoulder injuries. Sometimes a dye study called CT arthrogram is done, but it is not as widely used as MRI. In some institutions, special ultrasound tests may also be used to aid in the diagnosis. TREATMENT  Less Severe Cases  Use of a sling to rest the shoulder for a short period of time. Prolonged use of the sling can cause stiffness, weakness, and loss of motion of the shoulder joint.  Anti-inflammatory medicines, such as  ibuprofen or naproxen sodium, may be prescribed. More Severe Cases  Physical therapy.  Use of steroid injections into the shoulder joint.  Surgery. HOME CARE INSTRUCTIONS   Use a sling or splint until the pain decreases. Prolonged use of the sling can cause stiffness, weakness, and loss of motion of the shoulder joint.  Apply ice to the injured area:  Put ice in a plastic bag.  Place a towel between your skin and the bag.  Leave the ice on for 20 minutes, 2-3 times a day.  Try to avoid use other than gentle range of motion while your shoulder is painful. Use the shoulder and exercise only as directed by your health care provider. Stop exercises or range of motion if pain or discomfort increases, unless directed otherwise by your health care provider.  Only take over-the-counter or prescription medicines for pain, discomfort, or fever as directed by your health care provider.  If you were given a shoulder sling and straps (immobilizer), do not remove it except as directed, or until you see a health care provider for a follow-up exam. If you need to remove it, move your arm as little as possible or as directed.  You may want to sleep on several pillows at night to lessen swelling and pain. SEEK IMMEDIATE MEDICAL CARE IF:   Your shoulder pain increases or new pain develops in your arm, hand, or fingers and is not relieved with medicines.  You have new, unexplained symptoms, especially increased numbness in the hands or loss of strength.  You develop any worsening of the problems  that brought you in for care.  Your arm, hand, or fingers are numb or tingling.  Your arm, hand, or fingers are swollen, painful, or turn white or blue. MAKE SURE YOU:  Understand these instructions.  Will watch your condition.  Will get help right away if you are not doing well or get worse. Document Released: 10/10/2003 Document Revised: 05/10/2013 Document Reviewed: 03/01/2013 [facility] Patient  Information 2015 [place], [place]. This information is not intended to replace advice given to you by your health care provider. Make sure you discuss any questions you have with your health care provider.   RICE: Routine Care for Injuries The routine care of many injuries includes Rest, Ice, Compression, and Elevation (RICE). HOME CARE INSTRUCTIONS  Rest is needed to allow your body to heal. Routine activities can usually be resumed when comfortable. Injured tendons and bones can take up to 6 weeks to heal. Tendons are the cord-like structures that attach muscle to bone.  Ice following an injury helps keep the swelling down and reduces pain.  Put ice in a plastic bag.  Place a towel between your skin and the bag.  Leave the ice on for 15-20 minutes, 3-4 times a day, or as directed by your health care provider. Do this while awake, for the first 24 to 48 hours. After that, continue as directed by your caregiver.  Compression helps keep swelling down. It also gives support and helps with discomfort. If an elastic bandage has been applied, it should be removed and reapplied every 3 to 4 hours. It should not be applied tightly, but firmly enough to keep swelling down. Watch fingers or toes for swelling, bluish discoloration, coldness, numbness, or excessive pain. If any of these problems occur, remove the bandage and reapply loosely. Contact your caregiver if these problems continue.  Elevation helps reduce swelling and decreases pain. With extremities, such as the arms, hands, legs, and feet, the injured area should be placed near or above the level of the heart, if possible. SEEK IMMEDIATE MEDICAL CARE IF:  You have persistent pain and swelling.  You develop redness, numbness, or unexpected weakness.  Your symptoms are getting worse rather than improving after several days. These symptoms may indicate that further evaluation or further X-rays are needed. Sometimes, X-rays may not show a small  broken bone (fracture) until 1 week or 10 days later. Make a follow-up appointment with your caregiver. Ask when your X-ray results will be ready. Make sure you get your X-ray results. Document Released: 11/01/2000 Document Revised: 07/25/2013 Document Reviewed: 12/19/2010 [facility] Patient Information 2015 [place], [place]. This information is not intended to replace advice given to you by your health care provider. Make sure you discuss any questions you have with your health care provider.

## 2015-03-21 NOTE — ED Notes (Signed)
The pt has chronic shoulder pain and for the past 3 days the pain has increased

## 2015-03-21 NOTE — ED Provider Notes (Signed)
TIME SEEN: 4:40 AM  CHIEF COMPLAINT: Right shoulder pain  HPI: Pt is a 44 y.o. right-hand-dominant male with history of GERD and previous right shoulder pain who presents to the emergency department with complaints of right shoulder pain for the past 3 days. States he has been told he may have rotator cuff tendinitis but denies that his shoulder hurts him every day. States that the pain is kind worse over the past 3 days. No new injury. The pain is worse with movement of the shoulder. No radiation of pain. Has not tried any medication at home. Denies numbness, tingling or focal weakness. No chest pain or shortness of breath. Has not followed up with an orthopedic physician for this.  ROS: See HPI Constitutional: no fever  Eyes: no drainage  ENT: no runny nose   Cardiovascular:  no chest pain  Resp: no SOB  GI: no vomiting GU: no dysuria Integumentary: no rash  Allergy: no hives  Musculoskeletal: no leg swelling  Neurological: no slurred speech ROS otherwise negative  PAST MEDICAL HISTORY/PAST SURGICAL HISTORY:  Past Medical History  Diagnosis Date  . GERD (gastroesophageal reflux disease)     MEDICATIONS:  Prior to Admission medications   Medication Sig Start Date End Date Taking? Authorizing Provider  amoxicillin (AMOXIL) 500 MG capsule Take 1 capsule (500 mg total) by mouth 3 (three) times daily. Patient not taking: Reported on 02/22/2015 09/29/12   Delos Haring, PA-C  Aspirin-Salicylamide-Caffeine (BC HEADACHE POWDER PO) Take 1 packet by mouth 2 (two) times daily as needed. For headache    Historical Provider, MD  ondansetron (ZOFRAN) 4 MG tablet Take 1 tablet (4 mg total) by mouth every 6 (six) hours. Patient not taking: Reported on 02/22/2015 09/29/12   Delos Haring, PA-C  oxyCODONE-acetaminophen (PERCOCET/ROXICET) 5-325 MG per tablet Take 1 tablet by mouth every 6 (six) hours as needed for pain. Patient not taking: Reported on 02/22/2015 09/29/12   Delos Haring, PA-C   PERCOCET 5-325 MG per tablet Take 1 tablet by mouth every 6 (six) hours as needed for pain. Patient not taking: Reported on 02/22/2015 08/18/12   Verl Dicker, PA-C  ranitidine (ZANTAC) 150 MG tablet Take 150 mg by mouth 2 (two) times daily as needed. For acid reflux    Historical Provider, MD    ALLERGIES:  No Known Allergies  SOCIAL HISTORY:  Social History  Substance Use Topics  . Smoking status: Current Every Day Smoker  . Smokeless tobacco: Not on file  . Alcohol Use: No    FAMILY HISTORY: Family History  Problem Relation Age of Onset  . Hypertension Other   . CAD Other     EXAM: BP 159/97 mmHg  Pulse 58  Temp(Src) 98.4 F (36.9 C)  Resp 18  Ht 5\' 10"  (1.778 m)  Wt 252 lb 3 oz (114.391 kg)  BMI 36.18 kg/m2  SpO2 96% CONSTITUTIONAL: Alert and oriented and responds appropriately to questions. Well-appearing; well-nourished HEAD: Normocephalic EYES: Conjunctivae clear, PERRL ENT: normal nose; no rhinorrhea; moist mucous membranes; pharynx without lesions noted NECK: Supple, no meningismus, no LAD  CARD: RRR; S1 and S2 appreciated; no murmurs, no clicks, no rubs, no gallops RESP: Normal chest excursion without splinting or tachypnea; breath sounds clear and equal bilaterally; no wheezes, no rhonchi, no rales, no hypoxia or respiratory distress, speaking full sentences ABD/GI: Normal bowel sounds; non-distended; soft, non-tender, no rebound, no guarding, no peritoneal signs BACK:  The back appears normal and is non-tender to palpation, there is no CVA  tenderness EXT: Tender to palpation over the anterior right shoulder without bony deformity or loss of fullness, no erythema or warmth, decreased flexion of the shoulder secondary to pain, patient does have pain with testing of his rotator cuff muscles but no loss of strength, 2+ radial pulses bilaterally, normal grip strength bilaterally, otherwise Normal ROM in all joints; otherwise externally is are non-tender to palpation;  no edema; normal capillary refill; no cyanosis, no calf tenderness or swelling    SKIN: Normal color for age and race; warm NEURO: Moves all extremities equally, sensation to light touch intact diffusely, cranial nerves II through XII intact PSYCH: The patient's mood and manner are appropriate. Grooming and personal hygiene are appropriate.  MEDICAL DECISION MAKING: Patient here with likely rotator cuff tendinitis. No sign of septic arthritis. No history of injury. Discussed with patient that I do not feel he needs an x-ray given low suspicion for dislocation or fracture. He agrees. Discussed medical management with rest, ice, anti-inflammatory. Will also discharge with a prescription for Vicodin. Have provided him with outpatient orthopedic follow-up information. Discussed usual and customary return precautions. He verbalized understanding and is comfortable with this plan.       [place], DO 03/21/15 [phone]

## 2015-12-09 ENCOUNTER — Telehealth: Payer: Self-pay | Admitting: Behavioral Health

## 2015-12-09 NOTE — Telephone Encounter (Signed)
Unable to reach patient at time of Pre-Visit Call.  Left message for patient to return call when available.    

## 2015-12-10 ENCOUNTER — Encounter: Payer: Self-pay | Admitting: Medical

## 2015-12-10 ENCOUNTER — Ambulatory Visit (INDEPENDENT_AMBULATORY_CARE_PROVIDER_SITE_OTHER): Payer: BLUE CROSS/BLUE SHIELD | Admitting: Medical

## 2015-12-10 ENCOUNTER — Ambulatory Visit (HOSPITAL_BASED_OUTPATIENT_CLINIC_OR_DEPARTMENT_OTHER)
Admission: RE | Admit: 2015-12-10 | Discharge: 2015-12-10 | Disposition: A | Discharge status: Home / Self Care | Payer: BLUE CROSS/BLUE SHIELD | Source: Ambulatory Visit | Attending: Medical | Admitting: Medical

## 2015-12-10 VITALS — BP 122/84 | HR 90 | Temp 98.0°F | Ht 70.25 in | Wt 258.4 lb

## 2015-12-10 DIAGNOSIS — M25511 Pain in right shoulder: Secondary | ICD-10-CM | POA: Diagnosis present

## 2015-12-10 DIAGNOSIS — R1013 Epigastric pain: Secondary | ICD-10-CM

## 2015-12-10 DIAGNOSIS — J309 Allergic rhinitis, unspecified: Secondary | ICD-10-CM

## 2015-12-10 DIAGNOSIS — R351 Nocturia: Secondary | ICD-10-CM | POA: Diagnosis not present

## 2015-12-10 DIAGNOSIS — K219 Gastro-esophageal reflux disease without esophagitis: Secondary | ICD-10-CM | POA: Diagnosis not present

## 2015-12-10 LAB — POC URINALSYSI DIPSTICK (AUTOMATED)
BILIRUBIN UA: NEGATIVE
Blood, UA: NEGATIVE
Glucose, UA: NEGATIVE
KETONES UA: NEGATIVE
LEUKOCYTES UA: NEGATIVE
Nitrite, UA: NEGATIVE
PH UA: 7.5
Protein, UA: NEGATIVE
SPEC GRAV UA: 1.02
Urobilinogen, UA: 0.2

## 2015-12-10 LAB — COMPREHENSIVE METABOLIC PANEL
ALBUMIN: 4.5 g/dL (ref 3.5–5.2)
ALT: 40 U/L (ref 0–53)
AST: 27 U/L (ref 0–37)
Alkaline Phosphatase: 40 U/L (ref 39–117)
BUN: 9 mg/dL (ref 6–23)
CALCIUM: 10.1 mg/dL (ref 8.4–10.5)
CO2: 33 mEq/L — ABNORMAL HIGH (ref 19–32)
CREATININE: 1.05 mg/dL (ref 0.40–1.50)
Chloride: 101 mEq/L (ref 96–112)
GFR: 97.77 mL/min (ref >60.00)
GLUCOSE: 93 mg/dL (ref 70–99)
POTASSIUM: 4.7 meq/L (ref 3.5–5.1)
Sodium: 140 mEq/L (ref 135–145)
Total Bilirubin: 0.4 mg/dL (ref 0.2–1.2)
Total Protein: 7.5 g/dL (ref 6.0–8.3)

## 2015-12-10 LAB — CBC WITH DIFFERENTIAL/PLATELET
BASOS ABS: 0 10*3/uL (ref 0.0–0.1)
Basophils Relative: 0.4 % (ref 0.0–3.0)
EOS ABS: 0.3 10*3/uL (ref 0.0–0.7)
EOS PCT: 4.5 % (ref 0.0–5.0)
HCT: 45.7 % (ref 39.0–52.0)
Hemoglobin: 15.2 g/dL (ref 13.0–17.0)
LYMPHS ABS: 3 10*3/uL (ref 0.7–4.0)
LYMPHS PCT: 39.9 % (ref 12.0–46.0)
MCHC: 33.2 g/dL (ref 30.0–36.0)
MCV: 89.7 fl (ref 78.0–100.0)
MONO ABS: 0.5 10*3/uL (ref 0.1–1.0)
MONOS PCT: 6.8 % (ref 3.0–12.0)
NEUTROS ABS: 3.7 10*3/uL (ref 1.4–7.7)
NEUTROS PCT: 48.4 % (ref 43.0–77.0)
PLATELETS: 609 10*3/uL — AB (ref 150.0–400.0)
RBC: 5.1 Mil/uL (ref 4.22–5.81)
RDW: 13.9 % (ref 11.5–15.5)
WBC: 7.6 10*3/uL (ref 4.0–10.5)

## 2015-12-10 LAB — LIPASE: LIPASE: 11 U/L (ref 11.0–59.0)

## 2015-12-10 LAB — PSA: PSA: 1.04 ng/mL (ref 0.10–4.00)

## 2015-12-10 LAB — AMYLASE: AMYLASE: 57 U/L (ref 27–131)

## 2015-12-10 MED ORDER — OMEPRAZOLE 40 MG PO CPDR: 40.0000 mg | DELAYED_RELEASE_CAPSULE | Freq: Every day | ORAL | Status: DC

## 2015-12-10 NOTE — Progress Notes (Signed)
Pre visit review using our clinic review tool, if applicable. No additional management support is needed unless otherwise documented below in the visit note. 

## 2015-12-10 NOTE — Addendum Note (Signed)
Addended by: Tasia Catchings on: 12/10/2015 12:00 PM   Modules accepted: Orders

## 2015-12-10 NOTE — Patient Instructions (Signed)
For your abdomen pain and history of gerd will get labs to include bacteria test.  For reflux continue gerd diet and rx omeprazole. Also refer to GI.  For allergies well controlled now. But for future exacerbation remember could get flonase otc or I could write rx.  For pain in shoulder use tylenol. Will get xray today and refer to ortho evaluate rotator cuff.  For frequent urination. Will get psa today. Urine testing as well. Presently I would recommend cutting back on liquids late at night. Cut back on volume and consider not drinking anything past 8 pm.  Follow up in 3 wks or as needed Recommend scheduling complete physical and come in fasting.

## 2015-12-10 NOTE — Progress Notes (Signed)
Subjective:    Patient ID: Kasem Hafele, male    DOB: 11/05/1970, 45 y.o.   MRN: [id number]  HPI   I have reviewed pt PMH, PSH, FH, Social History and Surgical History.  Pt works [employer], Exercises one time a week. When does he walks, push ups and pull ups. Drinks coffee 1 a day at most, Admits to poor diet. Single.    Pt in states history of abdomen pain/reflux. But last 10 days has been worse. Pt states in past tried zantac and it helped but not recently not helping at all. Recently mad some dietary changes. No alcohol, stopped smoking, and cut back on fried foods. But reflux still persists. States he had reflux for 10 yrs. Pt never tried any other meds that zantac. Last 10 days has intermittent abdomen pain. He does associate pain with eating. Some nausea. Sometimes can feel regurgitation of food. But states does not come all the way up. Pt did mention last week some bright red blood in his stools. Saw stool looked bright red. When wiped saw bright red blood. Pt states has not reoccured. He stopped his bc powder.(no recent ha)    Htn- Pt used to be on blood pressure in 2007. But none since. He can't remember what med he was on.  Allergic rhinitis- Pt has history of allergies. Usually in the spring. Early April he had some symptoms. He did not take any meds. Pt tried afrin one time and got severe rebound congestion.  Shoulders pain-  Rt shoulder pain. Pain has been present for years. He states he when he lifts arm above his head will have shoulder pain. Pain in shoulder for 7 year. Pt states pain is getting more frequently. Pt told in past had rotator cuff injury. But he never has had mri  Also reports some frequent urination at night. This is going on for a couple of months. Gets up 2-3 times at night. Pt not sure of dad prostate history. He does drinks water sometimes at 11 pm.     Review of Systems  Constitutional: Negative for fever, chills and fatigue.  HENT:  Negative for congestion, ear pain, nosebleeds, postnasal drip, sinus pressure and sore throat.        Allergies controlled presently. No flare.  Gastrointestinal: Negative for abdominal pain, diarrhea, constipation, blood in stool, abdominal distention and anal bleeding.       None presently. But mostly when eats mostly has pain.  Musculoskeletal:       Shoulder pain.  Neurological: Negative for dizziness, seizures, syncope, speech difficulty, weakness, light-headedness and headaches.  Hematological: Negative for adenopathy. Does not bruise/bleed easily.  Psychiatric/Behavioral: Negative for behavioral problems and confusion.    Past Medical History  Diagnosis Date  . GERD (gastroesophageal reflux disease)   . Allergy   . Hypertension      Social History   Social History  . Marital Status: Single    Spouse Name: N/A  . Number of Children: N/A  . Years of Education: N/A   Occupational History  . Not on file.   Social History Main Topics  . Smoking status: Former Smoker    Quit date: 06/12/2015  . Smokeless tobacco: Never Used  . Alcohol Use: No  . Drug Use: Yes    Special: Marijuana     Comment: pt smokes twice a week. Each time 2 joints.  . Sexual Activity: Yes   Other Topics Concern  . Not on file  Social History Narrative    History reviewed. No pertinent past surgical history.  Family History  Problem Relation Age of Onset  . Hypertension Other   . CAD Other   . Breast cancer Mother   . Heart disease Mother   . Heart disease Brother   . Heart disease Maternal Grandmother   . Hypertension Maternal Grandmother     No Known Allergies  Current Outpatient Prescriptions on File Prior to Visit  Medication Sig Dispense Refill  . Aspirin-Salicylamide-Caffeine (BC HEADACHE POWDER PO) Take 1 packet by mouth 2 (two) times daily as needed. For headache    . ranitidine (ZANTAC) 150 MG tablet Take 150 mg by mouth 2 (two) times daily as needed. For acid reflux      No current facility-administered medications on file prior to visit.    BP 122/84 mmHg  Pulse 90  Temp(Src) 98 F (36.7 C) (Oral)  Ht 5' 10.25" (1.784 m)  Wt 258 lb 6.4 oz (117.209 kg)  BMI 36.83 kg/m2  SpO2 98%       Objective:   Physical Exam  General  Mental Status - Alert. General Appearance - Well groomed. Not in acute distress.  Skin Rashes- No Rashes.  HEENT Head- Normal. Ear Auditory Canal - Left- Normal. Right - Normal.Tympanic Membrane- Left- Normal. Right- Normal. Eye Sclera/Conjunctiva- Left- Normal. Right- Normal. Nose & Sinuses Nasal Mucosa- Left-  Boggy and Congested. Right-  Boggy and  Congested.Bilateral maxillary and frontal sinus pressure. Mouth & Throat Lips: Upper Lip- Normal: no dryness, cracking, pallor, cyanosis, or vesicular eruption. Lower Lip-Normal: no dryness, cracking, pallor, cyanosis or vesicular eruption. Buccal Mucosa- Bilateral- No Aphthous ulcers. Oropharynx- No Discharge or Erythema. Tonsils: Characteristics- Bilateral- No Erythema or Congestion. Size/Enlargement- Bilateral- No enlargement. Discharge- bilateral-None.  Neck Neck- Supple. No Masses.   Chest and Lung Exam Auscultation: Breath Sounds:-Clear even and unlabored.  Cardiovascular Auscultation:Rythm- Regular, rate and rhythm. Murmurs & Other Heart Sounds:Ausculatation of the heart reveal- No Murmurs.  Lymphatic Head & Neck General Head & Neck Lymphatics: Bilateral: Description- No Localized lymphadenopathy. Abdomen Inspection:-Inspeection Normal. Palpation/Percussion:Note:No mass. Palpation and Percussion of the abdomen reveal- Non Tender, Non Distended + BS, no rebound or guarding.   Neurologic Cranial Nerve exam:- CN III-XII intact(No nystagmus), symmetric smile. Strength:- 5/5 equal and symmetric strength both upper and lower extremities.  Rt shoulder- no pain on palpation. But limited abduction. With abduction reports pain.  Rectal- normal sphincter  tone. No hemorrhoids seen, prostate smooth no nodules and hemocult card negative for blood.       Assessment & Plan:  For your abdomen pain and history of gerd will get labs to include bacteria test.  For reflux continue gerd diet and rx omeprazole. Also refer to GI.  For allergies well controlled now. But for future exacerbation remember could get flonase otc or I could write rx.  For pain in shoulder use tylenol. Will get xray today and refer to ortho evaluate rotator cuff.  For frequent urination. Will get psa today. Urine testing as well. Presently I would recommend cutting back on liquids late at night. Cut back on volume and consider not drinking anything past 8 pm.  Follow up in 3 wks or as needed Recommend scheduling complete physical and come in fasting.  Clela Hagadorn, Percell Miller, PA-C

## 2015-12-11 LAB — H. PYLORI BREATH TEST: H. pylori Breath Test: NOT DETECTED

## 2015-12-31 ENCOUNTER — Ambulatory Visit: Payer: BLUE CROSS/BLUE SHIELD | Admitting: Medical

## 2016-01-07 ENCOUNTER — Ambulatory Visit (INDEPENDENT_AMBULATORY_CARE_PROVIDER_SITE_OTHER): Payer: BLUE CROSS/BLUE SHIELD | Admitting: Medical

## 2016-01-07 ENCOUNTER — Encounter: Payer: Self-pay | Admitting: Medical

## 2016-01-07 VITALS — BP 120/80 | HR 96 | Temp 98.6°F | Ht 70.0 in | Wt 258.0 lb

## 2016-01-07 DIAGNOSIS — M25511 Pain in right shoulder: Secondary | ICD-10-CM

## 2016-01-07 DIAGNOSIS — K219 Gastro-esophageal reflux disease without esophagitis: Secondary | ICD-10-CM

## 2016-01-07 MED ORDER — TRAMADOL HCL 50 MG PO TABS: ORAL_TABLET | ORAL | Status: DC

## 2016-01-07 NOTE — Patient Instructions (Addendum)
For your reflux symptoms continue omeprazole and healthy diet.  For your shoulder pain will rx tramadol short course just to use at night. Avoiding nsaids since your express that might bother your stomach and stomach now feels better.  Will go ahead and refer you to orthopedist. You may have had rotator cuff injury.  Follow up in 1-2 months. Come in fasting so we can do physical. Schedule early morning appointment.

## 2016-01-07 NOTE — Progress Notes (Signed)
Pre visit review using our clinic tool,if applicable. No additional management support is needed unless otherwise documented below in the visit note.  

## 2016-01-07 NOTE — Progress Notes (Signed)
Subjective:    Patient ID: William Fuller, male    DOB: [birth year], 45 y.o.   MRN: [id number]  HPI  Pt in states he feels like his stomach has improved. Pt is eating better and taking the omeprazole. He is aware of importance of diet.    Shoulders pain- Rt shoulder pain. Pain has been present for years. He states he when he lifts arm above his head will have shoulder pain. Pain in shoulder for 7 year. Pt states pain is getting more frequently. Pt told in past had rotator cuff injury. But he never has had mri. Since last visit pain persists. Pt states he does not like to take any medications since he feels may upset his stomach.   Pt updates me that frequency of urination did get better. His psa was normal and urine was normal. He no longer drinks at 8 pm. This helped a lot. Know will only get at most one time at night.   Review of Systems  Constitutional: Negative for chills and fatigue.  Respiratory: Negative for cough, chest tightness, shortness of breath and wheezing.   Cardiovascular: Negative for chest pain and palpitations.  Gastrointestinal: Negative for abdominal pain.       Much better.  Musculoskeletal: Negative for back pain and neck pain.       Rt shoulder pain.see hpi.  Skin: Negative for rash.  Hematological: Negative for adenopathy. Does not bruise/bleed easily.  Psychiatric/Behavioral: Negative for behavioral problems and confusion.   Past Medical History  Diagnosis Date  . GERD (gastroesophageal reflux disease)   . Allergy   . Hypertension      Social History   Social History  . Marital Status: Single    Spouse Name: N/A  . Number of Children: N/A  . Years of Education: N/A   Occupational History  . Not on file.   Social History Main Topics  . Smoking status: Former Smoker    Quit date: 06/12/2015  . Smokeless tobacco: Never Used  . Alcohol Use: No  . Drug Use: Yes    Special: Marijuana     Comment: pt smokes twice a week. Each time 2 joints.    . Sexual Activity: Yes   Other Topics Concern  . Not on file   Social History Narrative    No past surgical history on file.  Family History  Problem Relation Age of Onset  . Hypertension Other   . CAD Other   . Breast cancer Mother   . Heart disease Mother   . Heart disease Brother   . Heart disease Maternal Grandmother   . Hypertension Maternal Grandmother     No Known Allergies  Current Outpatient Prescriptions on File Prior to Visit  Medication Sig Dispense Refill  . Aspirin-Salicylamide-Caffeine (BC HEADACHE POWDER PO) Take 1 packet by mouth 2 (two) times daily as needed. For headache    . omeprazole (PRILOSEC) 40 MG capsule Take 1 capsule (40 mg total) by mouth daily. 30 capsule 3  . ranitidine (ZANTAC) 150 MG tablet Take 150 mg by mouth 2 (two) times daily as needed. For acid reflux     No current facility-administered medications on file prior to visit.    BP 120/80 mmHg  Pulse 96  Temp(Src) 98.6 F (37 C) (Oral)  Ht 5\' 10"  (1.778 m)  Wt 258 lb (117.028 kg)  BMI 37.02 kg/m2  SpO2 98%       Objective:   Physical Exam  General- No acute distress.  Pleasant patient. Neck- Full range of motion, no jvd Lungs- Clear, even and unlabored. Heart- regular rate and rhythm. Neurologic- CNII- XII grossly intact.  Rt shoulder- decreased abducution. Pain on palpation anterior and posterior aspect. No crepitus.      Assessment & Plan:  For your reflux symptoms continue omeprazole and healthy diet.  For your shoulder pain will rx tramadol short course just to use at night. Avoiding nsaids since your express that might bother your stomach and stomach now feels better.  Will go ahead and refer you to orthopedist. You may have had rotator cuff injury.  Follow up in 1-2 months. Come in fasting so we can do physical. Schedule early morning appointment.  Yissel Habermehl, Percell Miller, PA-C

## 2016-02-06 ENCOUNTER — Ambulatory Visit: Payer: BLUE CROSS/BLUE SHIELD | Admitting: Medical

## 2016-03-16 ENCOUNTER — Encounter: Payer: Self-pay | Admitting: Medical

## 2016-03-16 ENCOUNTER — Ambulatory Visit (INDEPENDENT_AMBULATORY_CARE_PROVIDER_SITE_OTHER): Payer: BLUE CROSS/BLUE SHIELD | Admitting: Medical

## 2016-03-16 VITALS — BP 120/80 | HR 77 | Temp 98.1°F | Ht 70.0 in | Wt 254.4 lb

## 2016-03-16 DIAGNOSIS — Z125 Encounter for screening for malignant neoplasm of prostate: Secondary | ICD-10-CM | POA: Diagnosis not present

## 2016-03-16 DIAGNOSIS — Z23 Encounter for immunization: Secondary | ICD-10-CM

## 2016-03-16 DIAGNOSIS — Z0000 Encounter for general adult medical examination without abnormal findings: Secondary | ICD-10-CM

## 2016-03-16 DIAGNOSIS — Z113 Encounter for screening for infections with a predominantly sexual mode of transmission: Secondary | ICD-10-CM | POA: Diagnosis not present

## 2016-03-16 LAB — COMPREHENSIVE METABOLIC PANEL
ALK PHOS: 40 U/L (ref 39–117)
ALT: 45 U/L (ref 0–53)
AST: 25 U/L (ref 0–37)
Albumin: 4.2 g/dL (ref 3.5–5.2)
BUN: 12 mg/dL (ref 6–23)
CO2: 31 meq/L (ref 19–32)
Calcium: 9.5 mg/dL (ref 8.4–10.5)
Chloride: 105 mEq/L (ref 96–112)
Creatinine, Ser: 1.09 mg/dL (ref 0.40–1.50)
GFR: 93.53 mL/min (ref >60.00)
GLUCOSE: 84 mg/dL (ref 70–99)
POTASSIUM: 4.4 meq/L (ref 3.5–5.1)
SODIUM: 135 meq/L (ref 135–145)
TOTAL PROTEIN: 7 g/dL (ref 6.0–8.3)
Total Bilirubin: 0.5 mg/dL (ref 0.2–1.2)

## 2016-03-16 LAB — LIPID PANEL
CHOL/HDL RATIO: 4
Cholesterol: 192 mg/dL (ref 0–200)
HDL: 47.6 mg/dL (ref >39.00)
LDL CALC: 126 mg/dL — AB (ref 0–99)
NonHDL: 144.04
TRIGLYCERIDES: 92 mg/dL (ref 0.0–149.0)
VLDL: 18.4 mg/dL (ref 0.0–40.0)

## 2016-03-16 LAB — PSA: PSA: 1.01 ng/mL (ref 0.10–4.00)

## 2016-03-16 LAB — CBC WITH DIFFERENTIAL/PLATELET
BASOS ABS: 0 10*3/uL (ref 0.0–0.1)
Basophils Relative: 0.5 % (ref 0.0–3.0)
Eosinophils Absolute: 0.1 10*3/uL (ref 0.0–0.7)
Eosinophils Relative: 1.4 % (ref 0.0–5.0)
HCT: 44.7 % (ref 39.0–52.0)
Hemoglobin: 14.8 g/dL (ref 13.0–17.0)
LYMPHS ABS: 3.2 10*3/uL (ref 0.7–4.0)
Lymphocytes Relative: 42.6 % (ref 12.0–46.0)
MCHC: 33.2 g/dL (ref 30.0–36.0)
MCV: 89.2 fl (ref 78.0–100.0)
MONO ABS: 0.5 10*3/uL (ref 0.1–1.0)
Monocytes Relative: 7.2 % (ref 3.0–12.0)
NEUTROS PCT: 48.3 % (ref 43.0–77.0)
Neutro Abs: 3.6 10*3/uL (ref 1.4–7.7)
PLATELETS: 525 10*3/uL — AB (ref 150.0–400.0)
RBC: 5.01 Mil/uL (ref 4.22–5.81)
RDW: 13.9 % (ref 11.5–15.5)
WBC: 7.4 10*3/uL (ref 4.0–10.5)

## 2016-03-16 LAB — TSH: TSH: 1.28 u[IU]/mL (ref 0.35–4.50)

## 2016-03-16 LAB — HIV ANTIBODY (ROUTINE TESTING W REFLEX): HIV 1&2 Ab, 4th Generation: NONREACTIVE

## 2016-03-16 NOTE — Patient Instructions (Addendum)
For your wellness exam will get cbc, cmp, tsh , lipid panel, and UA. PSA done in past.  Tdap update today.  Hiv screen today.  Diet and exercise to loose weight.  For fatigue will follow labs and see if find cause. May refer for sleep study since you snore if no cause found for fatigue on labs.   Follow up date to be determined after lab review.  Preventive Care for Adults, Male A healthy lifestyle and preventive care can promote health and wellness. Preventive health guidelines for men include the following key practices:  A routine yearly physical is a good way to check with your health care provider about your health and preventative screening. It is a chance to share any concerns and updates on your health and to receive a thorough exam.  Visit your dentist for a routine exam and preventative care every 6 months. Brush your teeth twice a day and floss once a day. Good oral hygiene prevents tooth decay and gum disease.  The frequency of eye exams is based on your age, health, family medical history, use of contact lenses, and other factors. Follow your health care provider's recommendations for frequency of eye exams.  Eat a healthy diet. Foods such as vegetables, fruits, whole grains, low-fat dairy products, and lean protein foods contain the nutrients you need without too many calories. Decrease your intake of foods high in solid fats, added sugars, and salt. Eat the right amount of calories for you.Get information about a proper diet from your health care provider, if necessary.  Regular physical exercise is one of the most important things you can do for your health. Most adults should get at least 150 minutes of moderate-intensity exercise (any activity that increases your heart rate and causes you to sweat) each week. In addition, most adults need muscle-strengthening exercises on 2 or more days a week.  Maintain a healthy weight. The body mass index (BMI) is a screening tool to  identify possible weight problems. It provides an estimate of body fat based on height and weight. Your health care provider can find your BMI and can help you achieve or maintain a healthy weight.For adults 20 years and older:  A BMI below 18.5 is considered underweight.  A BMI of 18.5 to 24.9 is normal.  A BMI of 25 to 29.9 is considered overweight.  A BMI of 30 and above is considered obese.  Maintain normal blood lipids and cholesterol levels by exercising and minimizing your intake of saturated fat. Eat a balanced diet with plenty of fruit and vegetables. Blood tests for lipids and cholesterol should begin at age 34 and be repeated every 5 years. If your lipid or cholesterol levels are high, you are over 50, or you are at high risk for heart disease, you may need your cholesterol levels checked more frequently.Ongoing high lipid and cholesterol levels should be treated with medicines if diet and exercise are not working.  If you smoke, find out from your health care provider how to quit. If you do not use tobacco, do not start.  Lung cancer screening is recommended for adults aged 33-80 years who are at high risk for developing lung cancer because of a history of smoking. A yearly low-dose CT scan of the lungs is recommended for people who have at least a 30-pack-year history of smoking and are a current smoker or have quit within the past 15 years. A pack year of smoking is smoking an average of 1  pack of cigarettes a day for 1 year (for example: 1 pack a day for 30 years or 2 packs a day for 15 years). Yearly screening should continue until the smoker has stopped smoking for at least 15 years. Yearly screening should be stopped for people who develop a health problem that would prevent them from having lung cancer treatment.  If you choose to drink alcohol, do not have more than 2 drinks per day. One drink is considered to be 12 ounces (355 mL) of beer, 5 ounces (148 mL) of wine, or 1.5  ounces (44 mL) of liquor.  Avoid use of street drugs. Do not share needles with anyone. Ask for help if you need support or instructions about stopping the use of drugs.  High blood pressure causes heart disease and increases the risk of stroke. Your blood pressure should be checked at least every 1-2 years. Ongoing high blood pressure should be treated with medicines, if weight loss and exercise are not effective.  If you are 65-38 years old, ask your health care provider if you should take aspirin to prevent heart disease.  Diabetes screening is done by taking a blood sample to check your blood glucose level after you have not eaten for a certain period of time (fasting). If you are not overweight and you do not have risk factors for diabetes, you should be screened once every 3 years starting at age 28. If you are overweight or obese and you are 77-41 years of age, you should be screened for diabetes every year as part of your cardiovascular risk assessment.  Colorectal cancer can be detected and often prevented. Most routine colorectal cancer screening begins at the age of 27 and continues through age 53. However, your health care provider may recommend screening at an earlier age if you have risk factors for colon cancer. On a yearly basis, your health care provider may provide home test kits to check for hidden blood in the stool. Use of a small camera at the end of a tube to directly examine the colon (sigmoidoscopy or colonoscopy) can detect the earliest forms of colorectal cancer. Talk to your health care provider about this at age 68, when routine screening begins. Direct exam of the colon should be repeated every 5-10 years through age 53, unless early forms of precancerous polyps or small growths are found.  People who are at an increased risk for hepatitis B should be screened for this virus. You are considered at high risk for hepatitis B if:  You were born in a country where hepatitis B  occurs often. Talk with your health care provider about which countries are considered high risk.  Your parents were born in a high-risk country and you have not received a shot to protect against hepatitis B (hepatitis B vaccine).  You have HIV or AIDS.  You use needles to inject street drugs.  You live with, or have sex with, someone who has hepatitis B.  You are a man who has sex with other men (MSM).  You get hemodialysis treatment.  You take certain medicines for conditions such as cancer, organ transplantation, and autoimmune conditions.  Hepatitis C blood testing is recommended for all people born from [birth year] through 1965 and any individual with known risks for hepatitis C.  Practice safe sex. Use condoms and avoid high-risk sexual practices to reduce the spread of sexually transmitted infections (STIs). STIs include gonorrhea, chlamydia, syphilis, trichomonas, herpes, HPV, and human immunodeficiency virus (HIV).  Herpes, HIV, and HPV are viral illnesses that have no cure. They can result in disability, cancer, and death.  If you are a man who has sex with other men, you should be screened at least once per year for:  HIV.  Urethral, rectal, and pharyngeal infection of gonorrhea, chlamydia, or both.  If you are at risk of being infected with HIV, it is recommended that you take a prescription medicine daily to prevent HIV infection. This is called preexposure prophylaxis (PrEP). You are considered at risk if:  You are a man who has sex with other men (MSM) and have other risk factors.  You are a heterosexual man, are sexually active, and are at increased risk for HIV infection.  You take drugs by injection.  You are sexually active with a partner who has HIV.  Talk with your health care provider about whether you are at high risk of being infected with HIV. If you choose to begin PrEP, you should first be tested for HIV. You should then be tested every 3 months for as long as  you are taking PrEP.  A one-time screening for abdominal aortic aneurysm (AAA) and surgical repair of large AAAs by ultrasound are recommended for men ages 34 to 29 years who are current or former smokers.  Healthy men should no longer receive prostate-specific antigen (PSA) blood tests as part of routine cancer screening. Talk with your health care provider about prostate cancer screening.  Testicular cancer screening is not recommended for adult males who have no symptoms. Screening includes self-exam, a health care provider exam, and other screening tests. Consult with your health care provider about any symptoms you have or any concerns you have about testicular cancer.  Use sunscreen. Apply sunscreen liberally and repeatedly throughout the day. You should seek shade when your shadow is shorter than you. Protect yourself by wearing long sleeves, pants, a wide-brimmed hat, and sunglasses year round, whenever you are outdoors.  Once a month, do a whole-body skin exam, using a mirror to look at the skin on your back. Tell your health care provider about new moles, moles that have irregular borders, moles that are larger than a pencil eraser, or moles that have changed in shape or color.  Stay current with required vaccines (immunizations).  Influenza vaccine. All adults should be immunized every year.  Tetanus, diphtheria, and acellular pertussis (Td, Tdap) vaccine. An adult who has not previously received Tdap or who does not know his vaccine status should receive 1 dose of Tdap. This initial dose should be followed by tetanus and diphtheria toxoids (Td) booster doses every 10 years. Adults with an unknown or incomplete history of completing a 3-dose immunization series with Td-containing vaccines should begin or complete a primary immunization series including a Tdap dose. Adults should receive a Td booster every 10 years.  Varicella vaccine. An adult without evidence of immunity to varicella  should receive 2 doses or a second dose if he has previously received 1 dose.  Human papillomavirus (HPV) vaccine. Males aged 11-21 years who have not received the vaccine previously should receive the 3-dose series. Males aged 22-26 years may be immunized. Immunization is recommended through the age of 26 years for any male who has sex with males and did not get any or all doses earlier. Immunization is recommended for any person with an immunocompromised condition through the age of 22 years if he did not get any or all doses earlier. During the 3-dose series, the  second dose should be obtained 4-8 weeks after the first dose. The third dose should be obtained 24 weeks after the first dose and 16 weeks after the second dose.  Zoster vaccine. One dose is recommended for adults aged 28 years or older unless certain conditions are present.  Measles, mumps, and rubella (MMR) vaccine. Adults born before [birth year] generally are considered immune to measles and mumps. Adults born in [birth year] or later should have 1 or more doses of MMR vaccine unless there is a contraindication to the vaccine or there is laboratory evidence of immunity to each of the three diseases. A routine second dose of MMR vaccine should be obtained at least 28 days after the first dose for students attending postsecondary schools, health care workers, or international travelers. People who received inactivated measles vaccine or an unknown type of measles vaccine during 1963-1967 should receive 2 doses of MMR vaccine. People who received inactivated mumps vaccine or an unknown type of mumps vaccine before 1979 and are at high risk for mumps infection should consider immunization with 2 doses of MMR vaccine. Unvaccinated health care workers born before [birth year] who lack laboratory evidence of measles, mumps, or rubella immunity or laboratory confirmation of disease should consider measles and mumps immunization with 2 doses of MMR vaccine or rubella  immunization with 1 dose of MMR vaccine.  Pneumococcal 13-valent conjugate (PCV13) vaccine. When indicated, a person who is uncertain of his immunization history and has no record of immunization should receive the PCV13 vaccine. All adults 32 years of age and older should receive this vaccine. An adult aged 73 years or older who has certain medical conditions and has not been previously immunized should receive 1 dose of PCV13 vaccine. This PCV13 should be followed with a dose of pneumococcal polysaccharide (PPSV23) vaccine. Adults who are at high risk for pneumococcal disease should obtain the PPSV23 vaccine at least 8 weeks after the dose of PCV13 vaccine. Adults older than 46 years of age who have normal immune system function should obtain the PPSV23 vaccine dose at least 1 year after the dose of PCV13 vaccine.  Pneumococcal polysaccharide (PPSV23) vaccine. When PCV13 is also indicated, PCV13 should be obtained first. All adults aged 60 years and older should be immunized. An adult younger than age 42 years who has certain medical conditions should be immunized. Any person who resides in a nursing home or long-term care facility should be immunized. An adult smoker should be immunized. People with an immunocompromised condition and certain other conditions should receive both PCV13 and PPSV23 vaccines. People with human immunodeficiency virus (HIV) infection should be immunized as soon as possible after diagnosis. Immunization during chemotherapy or radiation therapy should be avoided. Routine use of PPSV23 vaccine is not recommended for American Indians, [place]atives, or people younger than 65 years unless there are medical conditions that require PPSV23 vaccine. When indicated, people who have unknown immunization and have no record of immunization should receive PPSV23 vaccine. One-time revaccination 5 years after the first dose of PPSV23 is recommended for people aged 19-64 years who have chronic  kidney failure, nephrotic syndrome, asplenia, or immunocompromised conditions. People who received 1-2 doses of PPSV23 before age 51 years should receive another dose of PPSV23 vaccine at age 34 years or later if at least 5 years have passed since the previous dose. Doses of PPSV23 are not needed for people immunized with PPSV23 at or after age 35 years.  Meningococcal vaccine. Adults with asplenia or persistent complement component  deficiencies should receive 2 doses of quadrivalent meningococcal conjugate (MenACWY-D) vaccine. The doses should be obtained at least 2 months apart. Microbiologists working with certain meningococcal bacteria, [employer], people at risk during an outbreak, and people who travel to or live in countries with a high rate of meningitis should be immunized. A first-year college student up through age 32 years who is living in a residence hall should receive a dose if he did not receive a dose on or after his 16th birthday. Adults who have certain high-risk conditions should receive one or more doses of vaccine.  Hepatitis A vaccine. Adults who wish to be protected from this disease, have chronic liver disease, work with hepatitis A-infected animals, work in hepatitis A research labs, or travel to or work in countries with a high rate of hepatitis A should be immunized. Adults who were previously unvaccinated and who anticipate close contact with an international adoptee during the first 60 days after arrival in the [place] from a country with a high rate of hepatitis A should be immunized.  Hepatitis B vaccine. Adults should be immunized if they wish to be protected from this disease, are under age 23 years and have diabetes, have chronic liver disease, have had more than one sex partner in the past 6 months, may be exposed to blood or other infectious body fluids, are household contacts or sex partners of hepatitis B positive people, are clients or workers in certain  care facilities, or travel to or work in countries with a high rate of hepatitis B.  Haemophilus influenzae type b (Hib) vaccine. A previously unvaccinated person with asplenia or sickle cell disease or having a scheduled splenectomy should receive 1 dose of Hib vaccine. Regardless of previous immunization, a recipient of a hematopoietic stem cell transplant should receive a 3-dose series 6-12 months after his successful transplant. Hib vaccine is not recommended for adults with HIV infection. Preventive Service / Frequency Ages 11 to 34  Blood pressure check.** / Every 3-5 years.  Lipid and cholesterol check.** / Every 5 years beginning at age 62.  Hepatitis C blood test.** / For any individual with known risks for hepatitis C.  Skin self-exam. / Monthly.  Influenza vaccine. / Every year.  Tetanus, diphtheria, and acellular pertussis (Tdap, Td) vaccine.** / Consult your health care provider. 1 dose of Td every 10 years.  Varicella vaccine.** / Consult your health care provider.  HPV vaccine. / 3 doses over 6 months, if 74 or younger.  Measles, mumps, rubella (MMR) vaccine.** / You need at least 1 dose of MMR if you were born in 1957 or later. You may also need a second dose.  Pneumococcal 13-valent conjugate (PCV13) vaccine.** / Consult your health care provider.  Pneumococcal polysaccharide (PPSV23) vaccine.** / 1 to 2 doses if you smoke cigarettes or if you have certain conditions.  Meningococcal vaccine.** / 1 dose if you are age 27 to 75 years and a Market researcher living in a residence hall, or have one of several medical conditions. You may also need additional booster doses.  Hepatitis A vaccine.** / Consult your health care provider.  Hepatitis B vaccine.** / Consult your health care provider.  Haemophilus influenzae type b (Hib) vaccine.** / Consult your health care provider. Ages 73 to 79  Blood pressure check.** / Every year.  Lipid and cholesterol  check.** / Every 5 years beginning at age 93.  Lung cancer screening. / Every year if you are aged 104-80 years  and have a 30-pack-year history of smoking and currently smoke or have quit within the past 15 years. Yearly screening is stopped once you have quit smoking for at least 15 years or develop a health problem that would prevent you from having lung cancer treatment.  Fecal occult blood test (FOBT) of stool. / Every year beginning at age 59 and continuing until age 37. You may not have to do this test if you get a colonoscopy every 10 years.  Flexible sigmoidoscopy** or colonoscopy.** / Every 5 years for a flexible sigmoidoscopy or every 10 years for a colonoscopy beginning at age 40 and continuing until age 68.  Hepatitis C blood test.** / For all people born from [birth year] through 1965 and any individual with known risks for hepatitis C.  Skin self-exam. / Monthly.  Influenza vaccine. / Every year.  Tetanus, diphtheria, and acellular pertussis (Tdap/Td) vaccine.** / Consult your health care provider. 1 dose of Td every 10 years.  Varicella vaccine.** / Consult your health care provider.  Zoster vaccine.** / 1 dose for adults aged 18 years or older.  Measles, mumps, rubella (MMR) vaccine.** / You need at least 1 dose of MMR if you were born in 1957 or later. You may also need a second dose.  Pneumococcal 13-valent conjugate (PCV13) vaccine.** / Consult your health care provider.  Pneumococcal polysaccharide (PPSV23) vaccine.** / 1 to 2 doses if you smoke cigarettes or if you have certain conditions.  Meningococcal vaccine.** / Consult your health care provider.  Hepatitis A vaccine.** / Consult your health care provider.  Hepatitis B vaccine.** / Consult your health care provider.  Haemophilus influenzae type b (Hib) vaccine.** / Consult your health care provider. Ages 29 and over  Blood pressure check.** / Every year.  Lipid and cholesterol check.**/ Every 5 years beginning at  age 76.  Lung cancer screening. / Every year if you are aged 49-80 years and have a 30-pack-year history of smoking and currently smoke or have quit within the past 15 years. Yearly screening is stopped once you have quit smoking for at least 15 years or develop a health problem that would prevent you from having lung cancer treatment.  Fecal occult blood test (FOBT) of stool. / Every year beginning at age 17 and continuing until age 2. You may not have to do this test if you get a colonoscopy every 10 years.  Flexible sigmoidoscopy** or colonoscopy.** / Every 5 years for a flexible sigmoidoscopy or every 10 years for a colonoscopy beginning at age 56 and continuing until age 32.  Hepatitis C blood test.** / For all people born from [birth year] through 1966 and any individual with known risks for hepatitis C.  Abdominal aortic aneurysm (AAA) screening.** / A one-time screening for ages 59 to 57 years who are current or former smokers.  Skin self-exam. / Monthly.  Influenza vaccine. / Every year.  Tetanus, diphtheria, and acellular pertussis (Tdap/Td) vaccine.** / 1 dose of Td every 10 years.  Varicella vaccine.** / Consult your health care provider.  Zoster vaccine.** / 1 dose for adults aged 47 years or older.  Pneumococcal 13-valent conjugate (PCV13) vaccine.** / 1 dose for all adults aged 80 years and older.  Pneumococcal polysaccharide (PPSV23) vaccine.** / 1 dose for all adults aged 44 years and older.  Meningococcal vaccine.** / Consult your health care provider.  Hepatitis A vaccine.** / Consult your health care provider.  Hepatitis B vaccine.** / Consult your health care provider.  Haemophilus influenzae type  b (Hib) vaccine.** / Consult your health care provider. **Family history and personal history of risk and conditions may change your health care provider's recommendations.   This information is not intended to replace advice given to you by your health care provider. Make  sure you discuss any questions you have with your health care provider.   Document Released: 09/15/2001 Document Revised: 08/10/2014 Document Reviewed: 12/15/2010 Elsevier Interactive Patient Education [employer].

## 2016-03-16 NOTE — Addendum Note (Signed)
Addended by: Tasia Catchings on: 03/16/2016 05:35 PM   Modules accepted: Orders

## 2016-03-16 NOTE — Progress Notes (Signed)
Pre visit review using our clinic review tool, if applicable. No additional management support is needed unless otherwise documented below in the visit note./HSM  

## 2016-03-16 NOTE — Progress Notes (Signed)
Subjective:    Patient ID: William Fuller, male    DOB: 1970-10-17, 45 y.o.   MRN: [id number]  HPI  I have reviewed pt PMH, PSH, FH, Social History and Surgical History  Pt states some exercise at work. His work is fairly rigorous per his work. Pt is eating more fruits and vegetables. Coffee about one cup a day. No sodas.  Pt in and update me his stomach is feeling better. He does not have to take omeprazole daily. He is eating healthier.  Pt updates me that his rt shoulder pain is resolved after injection from orthopedist.  Pt is due for tdap.  Pt is willing to get Hiv screen.  Pt does not do flu vaccines.  Pt unaware of any prostate cancer history in dad or his uncles.    Review of Systems  Constitutional: Positive for fatigue. Negative for chills and fever.       Some fatigue.  HENT: Negative for congestion, ear pain, nosebleeds, rhinorrhea, sinus pressure and sore throat.        Pt does report some snoring for years per family.  Respiratory: Negative for cough, chest tightness, shortness of breath and wheezing.   Cardiovascular: Negative for chest pain and palpitations.  Gastrointestinal: Negative for abdominal pain.  Genitourinary: Negative for difficulty urinating, flank pain, frequency and testicular pain.       Pt had some urine frequency issues at night in the past. That resolved with fluid restriction past 8 pm.  Musculoskeletal: Negative for back pain.  Skin: Negative for pallor.  Neurological: Negative for dizziness, speech difficulty, weakness, numbness and headaches.  Hematological: Negative for adenopathy. Does not bruise/bleed easily.  Psychiatric/Behavioral: Negative for behavioral problems, confusion, dysphoric mood, self-injury and suicidal ideas. The patient is not nervous/anxious.     Past Medical History:  Diagnosis Date  . Allergy   . GERD (gastroesophageal reflux disease)   . Hypertension      Social History   Social History  . Marital  status: Single    Spouse name: N/A  . Number of children: N/A  . Years of education: N/A   Occupational History  . Not on file.   Social History Main Topics  . Smoking status: Former Smoker    Quit date: 06/12/2015  . Smokeless tobacco: Never Used  . Alcohol use No  . Drug use:     Types: Marijuana     Comment: pt smokes twice a week. Each time 2 joints.  . Sexual activity: Yes   Other Topics Concern  . Not on file   Social History Narrative  . No narrative on file    No past surgical history on file.  Family History  Problem Relation Age of Onset  . Hypertension Other   . CAD Other   . Breast cancer Mother   . Heart disease Mother   . Heart disease Brother   . Heart disease Maternal Grandmother   . Hypertension Maternal Grandmother     No Known Allergies  Current Outpatient Prescriptions on File Prior to Visit  Medication Sig Dispense Refill  . Aspirin-Salicylamide-Caffeine (BC HEADACHE POWDER PO) Take 1 packet by mouth 2 (two) times daily as needed. For headache    . omeprazole (PRILOSEC) 40 MG capsule Take 1 capsule (40 mg total) by mouth daily. 30 capsule 3  . ranitidine (ZANTAC) 150 MG tablet Take 150 mg by mouth 2 (two) times daily as needed. For acid reflux    . traMADol (ULTRAM) 50  MG tablet 1 tab po q hs for shoulder pain. 14 tablet 0   No current facility-administered medications on file prior to visit.     BP 120/80 (BP Location: Right Arm, Patient Position: Sitting, Cuff Size: Normal)   Pulse 77   Temp 98.1 F (36.7 C) (Oral)   Ht 5\' 10"  (1.778 m)   Wt 254 lb 6.4 oz (115.4 kg)   SpO2 95%   BMI 36.50 kg/m       Objective:   Physical Exam  General Mental Status- Alert. General Appearance- Not in acute distress.   Skin General: Color- Normal Color. Moisture- Normal Moisture.  Neck Carotid Arteries- Normal color. Moisture- Normal Moisture. No carotid bruits. No JVD.  Chest and Lung Exam Auscultation: Breath  Sounds:-Normal.  Cardiovascular Auscultation:Rythm- Regular. Murmurs & Other Heart Sounds:Auscultation of the heart reveals- No Murmurs.  Abdomen Inspection:-Inspeection Normal. Palpation/Percussion:Note:No mass. Palpation and Percussion of the abdomen reveal- Non Tender, Non Distended + BS, no rebound or guarding.   Neurologic Cranial Nerve exam:- CN III-XII intact(No nystagmus), symmetric smile. Strength:- 5/5 equal and symmetric strength both upper and lower extremities.      Assessment & Plan:  For your wellness exam will get cbc, cmp, tsh , lipid panel, and UA. PSA done in past.  Tdap update today.  Hiv screen today.  Diet and exercise to loose weight.  For fatigue will follow labs and see if find cause. May refer for sleep study since you snore if no cause found for fatigue on labs.   Follow up date to be determined after lab review.  Branon Sabine, Percell Miller, PA-C

## 2016-03-18 NOTE — Progress Notes (Signed)
Pt has seen results on MyChart and message also sent for patient to call back if any questions.

## 2016-09-08 ENCOUNTER — Encounter (HOSPITAL_COMMUNITY): Payer: Self-pay | Admitting: Emergency Medicine

## 2016-09-08 ENCOUNTER — Ambulatory Visit (HOSPITAL_COMMUNITY)
Admission: EM | Admit: 2016-09-08 | Discharge: 2016-09-08 | Disposition: A | Discharge status: Home / Self Care | Payer: BLUE CROSS/BLUE SHIELD | Attending: Family Medicine | Admitting: Family Medicine

## 2016-09-08 DIAGNOSIS — M7581 Other shoulder lesions, right shoulder: Secondary | ICD-10-CM | POA: Diagnosis not present

## 2016-09-08 DIAGNOSIS — G8929 Other chronic pain: Secondary | ICD-10-CM | POA: Diagnosis not present

## 2016-09-08 DIAGNOSIS — M25511 Pain in right shoulder: Secondary | ICD-10-CM | POA: Diagnosis not present

## 2016-09-08 MED ORDER — TRIAMCINOLONE ACETONIDE 40 MG/ML IJ SUSP
INTRAMUSCULAR | Status: AC
  Filled 2016-09-08: qty 1

## 2016-09-08 MED ORDER — PREDNISONE 10 MG (21) PO TBPK: 10.0000 mg | ORAL_TABLET | Freq: Every day | ORAL | 0 refills | Status: DC

## 2016-09-08 MED ORDER — BUPIVACAINE HCL (PF) 0.5 % IJ SOLN
INTRAMUSCULAR | Status: AC
  Filled 2016-09-08: qty 10

## 2016-09-08 MED ORDER — DICLOFENAC SODIUM 75 MG PO TBEC: 75.0000 mg | DELAYED_RELEASE_TABLET | Freq: Two times a day (BID) | ORAL | 0 refills | Status: DC

## 2016-09-08 NOTE — ED Provider Notes (Signed)
CSN: [id number]     Arrival date & time 09/08/16  1624 History   None    Chief Complaint  Patient presents with  . Shoulder Pain   (Consider location/radiation/quality/duration/timing/severity/associated sxs/prior Treatment) 46 year old male presents with chief complaint of right shoulder pain. Has previous diagnosis of rotator cuff tendonitis. He has not been evaluated or has followed up with orthopedics for management of his condition. 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 He has previous had shoulder joint injection that has given him relief and requests an injection tonight.   The history is provided by the patient.  Shoulder Pain    Past Medical History:  Diagnosis Date  . Allergy   . GERD (gastroesophageal reflux disease)   . Hypertension    History reviewed. No pertinent surgical history. Family History  Problem Relation Age of Onset  . Hypertension Other   . CAD Other   . Breast cancer Mother   . Heart disease Mother   . Heart disease Brother   . Heart disease Maternal Grandmother   . Hypertension Maternal Grandmother    Social History  Substance Use Topics  . Smoking status: Former Smoker    Quit date: 06/12/2015  . Smokeless tobacco: Never Used  . Alcohol use No    Review of Systems  Reason unable to perform ROS: as covered in HPI.  All other systems reviewed and are negative.   Allergies  Patient has no known allergies.  Home Medications   Prior to Admission medications   Not on File   Meds Ordered and Administered this Visit  Medications - No data to display  BP 136/79 (BP Location: Left Arm)   Pulse (!) 57   Temp 98.6 F (37 C) (Oral)   Resp 16   SpO2 97%  No data found.   Physical Exam  Constitutional: He is oriented to person, place, and time. He appears well-developed and well-nourished. No distress.  HENT:  Head: Normocephalic and atraumatic.  Right Ear: External ear  normal.  Left Ear: External ear normal.  Mouth/Throat: Oropharynx is clear and moist.  Neck: Normal range of motion.  Cardiovascular: Normal rate and regular rhythm.   Pulmonary/Chest: Effort normal and breath sounds normal.  Abdominal: Soft. Bowel sounds are normal.  Musculoskeletal:       Right shoulder: He exhibits decreased range of motion, tenderness, swelling and crepitus. He exhibits no deformity, no laceration and no pain.  Pain with external rotation and flexion, crepitus felt with movement of the joint through ROM assessments   Neurological: He is alert and oriented to person, place, and time.  Skin: Skin is warm and dry. Capillary refill takes less than 2 seconds. He is not diaphoretic.  Psychiatric: He has a normal mood and affect.  Nursing note and vitals reviewed.   Urgent Care Course     .Joint Aspiration/Arthrocentesis Date/Time: 09/08/2016 6:46 PM Performed by: Barnet Glasgow Authorized by: Robyn Haber   Consent:    Consent obtained:  Verbal   Consent given by:  Patient   Risks discussed:  Bleeding, infection, pain and nerve damage   Alternatives discussed:  No treatment, alternative treatment, observation and referral Location:    Location:  Shoulder   Shoulder:  R acromioclavicular Anesthesia (see MAR for exact dosages):    Anesthesia method:  Local infiltration   Local anesthetic:  Bupivacaine 0.5% w/o epi Procedure details:    Needle gauge:  70 G   Ultrasound guidance: no     Approach:  Anterior   Steroid injected: yes     Specimen collected: no   Post-procedure details:    Dressing:  Adhesive bandage   Patient tolerance of procedure:  Tolerated well, no immediate complications Comments:     40 mg of kenalog injected into the joint with bupivacaine.   (including critical care time)  Labs Review Labs Reviewed - No data to display  Imaging Review No results found.   Visual Acuity Review  Right Eye Distance:   Left Eye Distance:    Bilateral Distance:    Right Eye Near:   Left Eye Near:    Bilateral Near:         MDM   1. Chronic right shoulder pain   2. Rotator cuff tendonitis, right   You have received a joint injection tonight of kenalog 40 mg and 1 cc of bupivacaine for your rotator cuff tendonitis. I have sent prescriptions to your pharmacy of an extended prednisone taper, take it as directed. And I have prescribed a pain medicine called diclofenac. Take 1 tablet twice a day. I have included the contact information for an orthopedic doctor, I would follow up with him for treatment of your condition.       Barnet Glasgow, NP 09/08/16 1920

## 2016-09-08 NOTE — ED Triage Notes (Signed)
The patient presented to the [facility] with a complaint of right shoulder pain that started yesterday. The patient denied any known injury but stated that it is a chronic issue.

## 2016-09-08 NOTE — Discharge Instructions (Signed)
You have received a joint injection tonight of kenalog 40 mg and 1 cc of bupivacaine for your rotator cuff tendonitis. I have sent prescriptions to your pharmacy of an extended prednisone taper, take it as directed. And I have prescribed a pain medicine called diclofenac. Take 1 tablet twice a day. I have included the contact information for an orthopedic doctor, I would follow up with him for treatment of your condition.

## 2016-11-05 ENCOUNTER — Encounter: Payer: Self-pay | Admitting: Gastroenterology

## 2016-11-05 ENCOUNTER — Ambulatory Visit (INDEPENDENT_AMBULATORY_CARE_PROVIDER_SITE_OTHER): Payer: BC Managed Care – PPO | Admitting: Medical

## 2016-11-05 ENCOUNTER — Encounter: Payer: Self-pay | Admitting: Medical

## 2016-11-05 VITALS — BP 142/76 | HR 64 | Temp 99.0°F | Resp 16 | Ht 70.0 in | Wt 251.4 lb

## 2016-11-05 DIAGNOSIS — K219 Gastro-esophageal reflux disease without esophagitis: Secondary | ICD-10-CM

## 2016-11-05 DIAGNOSIS — R1013 Epigastric pain: Secondary | ICD-10-CM | POA: Diagnosis not present

## 2016-11-05 DIAGNOSIS — J301 Allergic rhinitis due to pollen: Secondary | ICD-10-CM | POA: Diagnosis not present

## 2016-11-05 LAB — CBC WITH DIFFERENTIAL/PLATELET
BASOS ABS: 0 10*3/uL (ref 0.0–0.1)
Basophils Relative: 0.6 % (ref 0.0–3.0)
EOS PCT: 0.7 % (ref 0.0–5.0)
Eosinophils Absolute: 0 10*3/uL (ref 0.0–0.7)
HEMATOCRIT: 44.2 % (ref 39.0–52.0)
Hemoglobin: 14.8 g/dL (ref 13.0–17.0)
LYMPHS PCT: 34.9 % (ref 12.0–46.0)
Lymphs Abs: 2.2 10*3/uL (ref 0.7–4.0)
MCHC: 33.4 g/dL (ref 30.0–36.0)
MCV: 89.5 fl (ref 78.0–100.0)
MONOS PCT: 8.3 % (ref 3.0–12.0)
Monocytes Absolute: 0.5 10*3/uL (ref 0.1–1.0)
Neutro Abs: 3.4 10*3/uL (ref 1.4–7.7)
Neutrophils Relative %: 55.5 % (ref 43.0–77.0)
Platelets: 551 10*3/uL — ABNORMAL HIGH (ref 150.0–400.0)
RBC: 4.94 Mil/uL (ref 4.22–5.81)
RDW: 13.7 % (ref 11.5–15.5)
WBC: 6.2 10*3/uL (ref 4.0–10.5)

## 2016-11-05 LAB — COMPREHENSIVE METABOLIC PANEL
ALK PHOS: 42 U/L (ref 39–117)
ALT: 31 U/L (ref 0–53)
AST: 27 U/L (ref 0–37)
Albumin: 4.3 g/dL (ref 3.5–5.2)
BILIRUBIN TOTAL: 0.5 mg/dL (ref 0.2–1.2)
BUN: 8 mg/dL (ref 6–23)
CALCIUM: 9.2 mg/dL (ref 8.4–10.5)
CO2: 31 mEq/L (ref 19–32)
Chloride: 104 mEq/L (ref 96–112)
Creatinine, Ser: 1.15 mg/dL (ref 0.40–1.50)
GFR: 87.68 mL/min (ref >60.00)
Glucose, Bld: 90 mg/dL (ref 70–99)
POTASSIUM: 3.7 meq/L (ref 3.5–5.1)
Sodium: 139 mEq/L (ref 135–145)
TOTAL PROTEIN: 7.7 g/dL (ref 6.0–8.3)

## 2016-11-05 LAB — LIPASE: LIPASE: 7 U/L — AB (ref 11.0–59.0)

## 2016-11-05 LAB — AMYLASE: Amylase: 58 U/L (ref 27–131)

## 2016-11-05 MED ORDER — LEVOCETIRIZINE DIHYDROCHLORIDE 5 MG PO TABS: 5.0000 mg | ORAL_TABLET | Freq: Every evening | ORAL | 3 refills | Status: DC

## 2016-11-05 MED ORDER — RANITIDINE HCL 150 MG PO CAPS: 150.0000 mg | ORAL_CAPSULE | Freq: Two times a day (BID) | ORAL | 0 refills | Status: DC

## 2016-11-05 MED ORDER — ONDANSETRON 8 MG PO TBDP: 8.0000 mg | ORAL_TABLET | Freq: Three times a day (TID) | ORAL | 0 refills | Status: DC | PRN

## 2016-11-05 MED ORDER — FLUTICASONE PROPIONATE 50 MCG/ACT NA SUSP: 2.0000 | Freq: Every day | NASAL | 1 refills | Status: DC

## 2016-11-05 NOTE — Progress Notes (Signed)
Subjective:    Patient ID: William Fuller, male    DOB: [birth year], 46 y.o.   MRN: [id number]  HPI  Pt in for recent persisting epigastric pain on and off since I last saw pt. In May 2017 I had written for omeprazole. He states he took medicine in past. He would take otc omeprazole intermittently over past year. He thinks tums actually helps him more.  Pt describes that for about 4-5 days he will eat and then regurgitate his foods. Pt states he can sip gatorade and keep food down and can eat grapes.  Pt states also some nasal congestion, sneezing, pnd and itching eyes. Hx of allergies.  No dark black stools. 2 months ago. One event faint bright red blood in stools.    Review of Systems  Constitutional: Negative for chills, fatigue and fever.  HENT: Positive for congestion, sinus pain and sinus pressure.   Respiratory: Negative for cough, choking, chest tightness, shortness of breath and wheezing.   Gastrointestinal: Positive for abdominal pain, nausea and vomiting. Negative for anal bleeding, blood in stool, constipation and diarrhea.  Neurological: Negative for dizziness, speech difficulty, weakness, light-headedness and headaches.  Hematological: Negative for adenopathy. Does not bruise/bleed easily.  Psychiatric/Behavioral: Negative for behavioral problems and confusion.     Past Medical History:  Diagnosis Date  . Allergy   . GERD (gastroesophageal reflux disease)   . Hypertension      Social History   Social History  . Marital status: Single    Spouse name: N/A  . Number of children: N/A  . Years of education: N/A   Occupational History  . Not on file.   Social History Main Topics  . Smoking status: Former Smoker    Quit date: 06/12/2015  . Smokeless tobacco: Never Used  . Alcohol use No  . Drug use: Yes    Types: Marijuana     Comment: pt smokes twice a week. Each time 2 joints.  . Sexual activity: Yes   Other Topics Concern  . Not on file   Social  History Narrative  . No narrative on file    No past surgical history on file.  Family History  Problem Relation Age of Onset  . Hypertension Other   . CAD Other   . Breast cancer Mother   . Heart disease Mother   . Heart disease Brother   . Heart disease Maternal Grandmother   . Hypertension Maternal Grandmother     No Known Allergies  Current Outpatient Prescriptions on File Prior to Visit  Medication Sig Dispense Refill  . diclofenac (VOLTAREN) 75 MG EC tablet Take 1 tablet (75 mg total) by mouth 2 (two) times daily. 20 tablet 0   No current facility-administered medications on file prior to visit.     BP (!) 142/76 (BP Location: Right Arm, Patient Position: Sitting, Cuff Size: Large)   Pulse 64   Temp 99 F (37.2 C) (Oral)   Resp 16   Ht 5\' 10"  (1.778 m)   Wt 251 lb 6.4 oz (114 kg)   SpO2 100%   BMI 36.07 kg/m       Objective:   Physical Exam   General  Mental Status - Alert. General Appearance - Well groomed. Not in acute distress.  Skin Rashes- No Rashes.  HEENT Head- Normal. Ear Auditory Canal - Left- Normal. Right - Normal.Tympanic Membrane- Left- Normal. Right- Normal. Eye Sclera/Conjunctiva- Left- Normal. Right- Normal. Nose & Sinuses Nasal Mucosa- Left-  Boggy  and Congested. Right-  Boggy and  Congested.Bilateral faint maxillary and  Faint frontal sinus pressure. Mouth & Throat Lips: Upper Lip- Normal: no dryness, cracking, pallor, cyanosis, or vesicular eruption. Lower Lip-Normal: no dryness, cracking, pallor, cyanosis or vesicular eruption. Buccal Mucosa- Bilateral- No Aphthous ulcers. Oropharynx- No Discharge or Erythema. Tonsils: Characteristics- Bilateral- No Erythema or Congestion. Size/Enlargement- Bilateral- No enlargement. Discharge- bilateral-None.  Neck Neck- Supple. No Masses.   Chest and Lung Exam Auscultation: Breath Sounds:-Clear even and unlabored.  Cardiovascular Auscultation:Rythm- Regular, rate and rhythm. Murmurs &  Other Heart Sounds:Ausculatation of the heart reveal- No Murmurs.  Lymphatic Head & Neck General Head & Neck Lymphatics: Bilateral: Description- No Localized lymphadenopathy.    Abdomen Inspection:-Inspection Normal.  Palpation/Perucssion: Palpation and Percussion of the abdomen reveal- Non Tender, No Rebound tenderness, No rigidity(Guarding) and No Palpable abdominal masses.  Liver:-Normal.  Spleen:- Normal.   Back- no cva tenderness.      Assessment & Plan:  For your probable severe gerd. Rx zofran for N and V. For reflux ranitidine. Please get labs today.  Please keep  Hydrated. Would recommend [employer] water.  For allergies rx xyzal and flonase. You may need antibiotic if sius pressure not improved but now antibiotic might bother your stomach excessively.  Refer to GI  Follow up in 7 days or as needed

## 2016-11-05 NOTE — Progress Notes (Signed)
Pre visit review using our clinic review tool, if applicable. No additional management support is needed unless otherwise documented below in the visit note. 

## 2016-11-05 NOTE — Patient Instructions (Addendum)
For your probable severe gerd. Rx zofran for N and V. For reflux ranitidine. Please get labs today.  Please keep hydrated. Would recommend [employer] water.  For allergies rx xyzal and flonase. You may need antibiotic if sius pressure not improved but now antibiotic might bother your stomach excessively.  Refer to GI  Follow up in 7 days or as needed  If nausea and vomiting persists then would recommend ED evaluation for iv hydration.

## 2016-11-06 LAB — H. PYLORI BREATH TEST: H. pylori Breath Test: NOT DETECTED

## 2016-11-11 ENCOUNTER — Encounter: Payer: Self-pay | Admitting: Gastroenterology

## 2016-11-11 ENCOUNTER — Ambulatory Visit (INDEPENDENT_AMBULATORY_CARE_PROVIDER_SITE_OTHER): Payer: BC Managed Care – PPO | Admitting: Gastroenterology

## 2016-11-11 VITALS — BP 110/80 | HR 64 | Ht 70.0 in | Wt 258.0 lb

## 2016-11-11 DIAGNOSIS — Z1211 Encounter for screening for malignant neoplasm of colon: Secondary | ICD-10-CM

## 2016-11-11 DIAGNOSIS — K219 Gastro-esophageal reflux disease without esophagitis: Secondary | ICD-10-CM | POA: Diagnosis not present

## 2016-11-11 DIAGNOSIS — R1013 Epigastric pain: Secondary | ICD-10-CM | POA: Diagnosis not present

## 2016-11-11 MED ORDER — PANTOPRAZOLE SODIUM 40 MG PO TBEC: 40.0000 mg | DELAYED_RELEASE_TABLET | Freq: Two times a day (BID) | ORAL | 1 refills | Status: DC

## 2016-11-11 MED ORDER — NA SULFATE-K SULFATE-MG SULF 17.5-3.13-1.6 GM/177ML PO SOLN: 1.0000 | Freq: Once | ORAL | 0 refills | Status: AC

## 2016-11-11 NOTE — Progress Notes (Signed)
HPI :  46 y/o [place] male with a history of seasonal allergies, GERD, hypertension, here for a new patient evaluation for symptoms of epigastric pain and reflux.  He has been having some epigastric pain along with regurgitatoin of food and belching. He states symptoms ongoing for a few years. He has pyrosis frequently. He has symptoms both after he eats but also anytime. He doesn't have much nocturnal symptoms. No dysphagia he has to foods, occasional dysphagia to pills. No odynophagia. He has some nausea, rare vomiting. He reports epigastric discomfort can bother him at any time, takes away his appetite. TUMS helps and he takes that as needed. He was given zantac which did not help too much.  He denies NSAID use. He was previously on prilosec 40mg  once daily which did not help too much, took it last year.   He has mild constipation, no diarrhea. He has had rare blood in the stools.   No prior colonoscopy or endoscopy.   No FH of colon cancer or gastric cancer.   He's had a breath test for H pylori which was negative. Negative lipase. Normal LFTs. CBC normal   Past Medical History:  Diagnosis Date  . Allergy   . GERD (gastroesophageal reflux disease)   . Hypertension      Past Surgical History:  Procedure Laterality Date  . HERNIA REPAIR     Family History  Problem Relation Age of Onset  . Hypertension Other   . CAD Other   . Breast cancer Mother   . Heart disease Mother   . Heart disease Brother   . Heart disease Maternal Grandmother   . Hypertension Maternal Grandmother    Social History  Substance Use Topics  . Smoking status: Former Smoker    Quit date: 06/12/2015  . Smokeless tobacco: Never Used  . Alcohol use No   Current Outpatient Prescriptions  Medication Sig Dispense Refill  . diclofenac (VOLTAREN) 75 MG EC tablet Take 1 tablet (75 mg total) by mouth 2 (two) times daily. 20 tablet 0  . fluticasone (FLONASE) 50 MCG/ACT nasal spray Place 2 sprays  into both nostrils daily. 16 g 1  . levocetirizine (XYZAL) 5 MG tablet Take 1 tablet (5 mg total) by mouth every evening. 30 tablet 3  . ondansetron (ZOFRAN ODT) 8 MG disintegrating tablet Take 1 tablet (8 mg total) by mouth every 8 (eight) hours as needed for nausea or vomiting. 20 tablet 0  . ranitidine (ZANTAC) 150 MG capsule Take 1 capsule (150 mg total) by mouth 2 (two) times daily. 60 capsule 0   No current facility-administered medications for this visit.    No Known Allergies   Review of Systems: All systems reviewed and negative except where noted in HPI.   Lab Results  Component Value Date   WBC 6.2 11/05/2016   HGB 14.8 11/05/2016   HCT 44.2 11/05/2016   MCV 89.5 11/05/2016   PLT 551.0 (H) 11/05/2016    Lab Results  Component Value Date   CREATININE 1.15 11/05/2016   BUN 8 11/05/2016   NA 139 11/05/2016   K 3.7 11/05/2016   CL 104 11/05/2016   CO2 31 11/05/2016    Lab Results  Component Value Date   ALT 31 11/05/2016   AST 27 11/05/2016   ALKPHOS 42 11/05/2016   BILITOT 0.5 11/05/2016   Lab Results  Component Value Date   LIPASE 7.0 (L) 11/05/2016     Physical Exam: BP 110/80 (Cuff Size:  Large)   Pulse 64   Ht 5\' 10"  (1.778 m)   Wt 258 lb (117 kg)   BMI 37.02 kg/m  Constitutional: Pleasant,well-developed, male in no acute distress. HEENT: Normocephalic and atraumatic. Conjunctivae are normal. No scleral icterus. Neck supple.  Cardiovascular: Normal rate, regular rhythm.  Pulmonary/chest: Effort normal and breath sounds normal. No wheezing, rales or rhonchi. Abdominal: Soft, protuberant, nontender. There are no masses palpable. No hepatomegaly. Extremities: no edema Lymphadenopathy: No cervical adenopathy noted. Neurological: Alert and oriented to person place and time. Skin: Skin is warm and dry. No rashes noted. Psychiatric: Normal mood and affect. Behavior is normal.   ASSESSMENT AND PLAN: 46 year old male here for a new patient  assessment the following issues:  GERD / epigastric pain - I suspect is poorly controlled reflux is driving symptoms, perhaps he has distal esophagitis causing epigastric pain. I'm recommending an upper endoscopy to further evaluate his symptoms given lack of response to Zantac and omeprazole previously . I discussed risks and benefits of endoscopy with him and he wished proceed. He's been on omeprazole the past without significant benefit, we'll try him on Protonix 40 mg twice a day for a few weeks to see if this can help control symptoms and then long-term use lowest-dose dose needed to control symptoms. He can continue Tums as needed for breakthrough.   Colon cancer screening - given his ethnicity is due for routine colon cancer screening. He's had rare episodes of blood in the stools, recommend optical colonoscopy for screening purposes. Following discussion of this he wished proceed. Further recognitions pending the results of these exams.  [place] Cellar, MD [facility] Pager [phone]  CC: [name], PA-C

## 2016-11-11 NOTE — Patient Instructions (Addendum)
If you are age 47 or older, your body mass index should be between 23-30. Your Body mass index is 37.02 kg/m. If this is out of the aforementioned range listed, please consider follow up with your Primary Care Provider.  If you are age 66 or younger, your body mass index should be between 19-25. Your Body mass index is 37.02 kg/m. If this is out of the aformentioned range listed, please consider follow up with your Primary Care Provider.   We have sent the following medications to your pharmacy for you to pick up at your convenience:  [place] have been scheduled for an endoscopy and colonoscopy. Please follow the written instructions given to you at your visit today. Please pick up your prep supplies at the pharmacy within the next 1-3 days. If you use inhalers (even only as needed), please bring them with you on the day of your procedure. Your physician has requested that you go to www.startemmi.com and enter the access code given to you at your visit today. This web site gives a general overview about your procedure. However, you should still follow specific instructions given to you by our office regarding your preparation for the procedure.  Thank you.

## 2016-11-12 ENCOUNTER — Ambulatory Visit (INDEPENDENT_AMBULATORY_CARE_PROVIDER_SITE_OTHER): Payer: BC Managed Care – PPO | Admitting: Medical

## 2016-11-12 ENCOUNTER — Encounter: Payer: Self-pay | Admitting: Medical

## 2016-11-12 VITALS — BP 132/91 | HR 70 | Temp 98.3°F | Resp 16 | Ht 72.0 in | Wt 256.8 lb

## 2016-11-12 DIAGNOSIS — K219 Gastro-esophageal reflux disease without esophagitis: Secondary | ICD-10-CM | POA: Diagnosis not present

## 2016-11-12 DIAGNOSIS — J301 Allergic rhinitis due to pollen: Secondary | ICD-10-CM

## 2016-11-12 DIAGNOSIS — M542 Cervicalgia: Secondary | ICD-10-CM | POA: Diagnosis not present

## 2016-11-12 NOTE — Progress Notes (Signed)
Pre visit review using our clinic review tool, if applicable. No additional management support is needed unless otherwise documented below in the visit note. 

## 2016-11-12 NOTE — Progress Notes (Signed)
Subjective:    Patient ID: William Fuller, male    DOB: May 01, 1971, 46 y.o.   MRN: [id number]  HPI  Pt in states his stomach feels better. He states got better as allergies cleared up.(but also started protonix)  He did see specialist yesterday. He will get colonoscopy and EGD near future.  Specialist gave him protonix for his likely gerd.(I reviewed specialist not yesterday)  Sinus symptoms also better. I gave zyzal and flonase.     Review of Systems  Constitutional: Negative for chills, fatigue and fever.  Respiratory: Negative for cough, chest tightness, shortness of breath and wheezing.   Cardiovascular: Negative for chest pain and palpitations.  Gastrointestinal: Negative for abdominal pain, diarrhea, nausea and vomiting.  Musculoskeletal: Positive for neck pain. Negative for back pain.       Pt notes occasional pain from neck that radiates to rt shoulder. He states rt shoulder evaluated and told no rotator cuff injury.  Skin: Negative for pallor and rash.  Neurological: Negative for dizziness, weakness and headaches.  Hematological: Negative for adenopathy. Does not bruise/bleed easily.  Psychiatric/Behavioral: Negative for behavioral problems and confusion.    Past Medical History:  Diagnosis Date  . Allergy   . GERD (gastroesophageal reflux disease)   . Hypertension      Social History   Social History  . Marital status: Single    Spouse name: N/A  . Number of children: N/A  . Years of education: N/A   Occupational History  . Not on file.   Social History Main Topics  . Smoking status: Former Smoker    Quit date: 06/12/2015  . Smokeless tobacco: Never Used  . Alcohol use No  . Drug use: Yes    Types: Marijuana     Comment: pt smokes twice a week. Each time 2 joints.  . Sexual activity: Yes   Other Topics Concern  . Not on file   Social History Narrative  . No narrative on file    Past Surgical History:  Procedure Laterality Date  . HERNIA  REPAIR      Family History  Problem Relation Age of Onset  . Hypertension Other   . CAD Other   . Breast cancer Mother   . Heart disease Mother   . Heart disease Brother   . Heart disease Maternal Grandmother   . Hypertension Maternal Grandmother     No Known Allergies  Current Outpatient Prescriptions on File Prior to Visit  Medication Sig Dispense Refill  . diclofenac (VOLTAREN) 75 MG EC tablet Take 1 tablet (75 mg total) by mouth 2 (two) times daily. 20 tablet 0  . fluticasone (FLONASE) 50 MCG/ACT nasal spray Place 2 sprays into both nostrils daily. 16 g 1  . levocetirizine (XYZAL) 5 MG tablet Take 1 tablet (5 mg total) by mouth every evening. 30 tablet 3  . ondansetron (ZOFRAN ODT) 8 MG disintegrating tablet Take 1 tablet (8 mg total) by mouth every 8 (eight) hours as needed for nausea or vomiting. 20 tablet 0  . pantoprazole (PROTONIX) 40 MG tablet Take 1 tablet (40 mg total) by mouth 2 (two) times daily. 120 tablet 1  . ranitidine (ZANTAC) 150 MG capsule Take 1 capsule (150 mg total) by mouth 2 (two) times daily. 60 capsule 0   No current facility-administered medications on file prior to visit.     BP (!) 132/91 (BP Location: Right Arm, Patient Position: Sitting, Cuff Size: Large)   Pulse 70   Temp 98.3  F (36.8 C) (Oral)   Resp 16   Ht 6' (1.829 m)   Wt 256 lb 12.8 oz (116.5 kg)   SpO2 98%   BMI 34.83 kg/m       Objective:   Physical Exam  General  Mental Status - Alert. General Appearance - Well groomed. Not in acute distress.  Skin Rashes- No Rashes.  HEENT Head- Normal. Ear Auditory Canal - Left- Normal. Right - Normal.Tympanic Membrane- Left- Normal. Right- Normal. Eye Sclera/Conjunctiva- Left- Normal. Right- Normal. Nose & Sinuses Nasal Mucosa- Left-  Boggy and Congested. Right-  Boggy and  Congested.Bilateral no maxillary and no  frontal sinus pressure. Mouth & Throat Lips: Upper Lip- Normal: no dryness, cracking, pallor, cyanosis, or vesicular  eruption. Lower Lip-Normal: no dryness, cracking, pallor, cyanosis or vesicular eruption. Buccal Mucosa- Bilateral- No Aphthous ulcers. Oropharynx- No Discharge or Erythema. Tonsils: Characteristics- Bilateral- No Erythema or Congestion. Size/Enlargement- Bilateral- No enlargement. Discharge- bilateral-None.  Neck Neck- Supple. No Masses. fant lower mid cspine tender. Some mild faint  pain in left trapezius on palpation.  Chest and Lung Exam Auscultation: Breath Sounds:-Clear even and unlabored.  Cardiovascular Auscultation:Rythm- Regular, rate and rhythm. Murmurs & Other Heart Sounds:Ausculatation of the heart reveal- No Murmurs.  Lymphatic Head & Neck General Head & Neck Lymphatics: Bilateral: Description- No Localized lymphadenopathy.  Abd- soft, nt, nd, +bs.       Assessment & Plan:  For gerd type symptoms continue protonix and doe egd + colonoscopy with GI MD.  For allergies continue xyzal and flonase.  For your neck pain that radiates at times to rt shoulder I ordered xray of neck today. You can get it today or at your convenience. Can use tylenol or low ibuprofen for pain if needed  Follow up as needed (explained will depend on xray results and  gi study results.)   Zackarey Holleman, Percell Miller, PA-C

## 2016-11-12 NOTE — Patient Instructions (Addendum)
For gerd type symptoms continue protonix and doe egd + colonoscopy with GI MD.  For allergies continue xyzal and flonase.  For your neck pain that radiates at times to rt shoulder I ordered xray of neck today. You can get it today or at your convenience. Can use tylenol or low ibuprofen for pain if needed  Follow up as needed(explained will depend on xray results and  gi study results.)

## 2016-12-21 ENCOUNTER — Encounter: Payer: Self-pay | Admitting: Gastroenterology

## 2017-01-01 ENCOUNTER — Ambulatory Visit (AMBULATORY_SURGERY_CENTER): Payer: BC Managed Care – PPO | Admitting: Gastroenterology

## 2017-01-01 ENCOUNTER — Encounter: Payer: Self-pay | Admitting: Gastroenterology

## 2017-01-01 VITALS — BP 123/87 | HR 91 | Temp 99.1°F | Resp 12 | Ht 70.0 in | Wt 258.0 lb

## 2017-01-01 DIAGNOSIS — D125 Benign neoplasm of sigmoid colon: Secondary | ICD-10-CM

## 2017-01-01 DIAGNOSIS — R1013 Epigastric pain: Secondary | ICD-10-CM

## 2017-01-01 DIAGNOSIS — K5520 Angiodysplasia of colon without hemorrhage: Secondary | ICD-10-CM

## 2017-01-01 DIAGNOSIS — K621 Rectal polyp: Secondary | ICD-10-CM | POA: Diagnosis not present

## 2017-01-01 DIAGNOSIS — K6389 Other specified diseases of intestine: Secondary | ICD-10-CM

## 2017-01-01 DIAGNOSIS — K625 Hemorrhage of anus and rectum: Secondary | ICD-10-CM

## 2017-01-01 DIAGNOSIS — K219 Gastro-esophageal reflux disease without esophagitis: Secondary | ICD-10-CM

## 2017-01-01 DIAGNOSIS — K2950 Unspecified chronic gastritis without bleeding: Secondary | ICD-10-CM | POA: Diagnosis not present

## 2017-01-01 MED ORDER — SODIUM CHLORIDE 0.9 % IV SOLN: 500.0000 mL | INTRAVENOUS | Status: DC

## 2017-01-01 NOTE — Progress Notes (Signed)
Called to room to assist during endoscopic procedure.  Patient ID and intended procedure confirmed with present staff. Received instructions for my participation in the procedure from the performing physician.  

## 2017-01-01 NOTE — Progress Notes (Signed)
Pt's states no medical or surgical changes since previsit or office visit. 

## 2017-01-01 NOTE — Progress Notes (Signed)
To recovery VSS report to [employer]

## 2017-01-01 NOTE — Op Note (Signed)
[place] Patient Name: Anthem Frazer Procedure Date: 01/01/2017 2:07 PM MRN: [id number] Endoscopist: Remo Lipps P. Armbruster MD, MD Age: 46 Referring MD:  Date of Birth: 11-04-1970 Gender: Male Account #: [id number] Procedure:                Upper GI endoscopy Indications:              Epigastric abdominal pain, Heartburn -                            significantly improved on twice daily protonix Medicines:                Monitored Anesthesia Care Procedure:                Pre-Anesthesia Assessment:                           - Prior to the procedure, a History and Physical                            was performed, and patient medications and                            allergies were reviewed. The patient's tolerance of                            previous anesthesia was also reviewed. The risks                            and benefits of the procedure and the sedation                            options and risks were discussed with the patient.                            All questions were answered, and informed consent                            was obtained. Prior Anticoagulants: The patient has                            taken no previous anticoagulant or antiplatelet                            agents. ASA Grade Assessment: II - A patient with                            mild systemic disease. After reviewing the risks                            and benefits, the patient was deemed in                            satisfactory condition to undergo the procedure.  After obtaining informed consent, the endoscope was                            passed under direct vision. Throughout the                            procedure, the patient's blood pressure, pulse, and                            oxygen saturations were monitored continuously. The                            Model GIF-[id number]) scope was introduced                            through the  mouth, and advanced to the second part                            of duodenum. The upper GI endoscopy was                            accomplished without difficulty. The patient                            tolerated the procedure well. Scope In: Scope Out: Findings:                 Esophagogastric landmarks were identified: the                            Z-line was found at 42 cm, the gastroesophageal                            junction was found at 42 cm and the upper extent of                            the gastric folds was found at 45 cm from the                            incisors. Z-line was regular                           A 3 cm hiatal hernia was present.                           LA Grade A (one or more mucosal breaks less than 5                            mm, not extending between tops of 2 mucosal folds)                            esophagitis was found at the GEJ.  There was a suggestion of possible trace to small                            esophageal varices in the distal esophagus. The                            exam of the esophagus was otherwise normal.                           Diffuse granularity was found in the entire                            examined stomach. Biopsies were taken from the                            incisura, body, and antrum with a cold forceps for                            histology to test for H pylori.                           The exam of the stomach was otherwise normal.                           The duodenal bulb and second portion of the                            duodenum were normal. Complications:            No immediate complications. Estimated blood loss:                            Minimal. Estimated Blood Loss:     Estimated blood loss was minimal. Impression:               - Esophagogastric landmarks identified.                           - 3 cm hiatal hernia.                           - LA Grade A reflux  esophagitis.                           - Possible trace to small esophageal varices                           - Granular mucosa of the stomach. Biopsied.                           - Normal duodenal bulb and second portion of the                            duodenum.  Overall, suspect esophagitis / heartburn was the                            cause of the patient's symptoms. I suspect                            esophagitis previously was much worse prior to                            adding high dose protonix. Incidentally noted was                            suggestion of possible trace to small varices                            versus normal variant, which warrants further                            evaluation. Recommendation:           - Patient has a contact number available for                            emergencies. The signs and symptoms of potential                            delayed complications were discussed with the                            patient. Return to normal activities tomorrow.                            Written discharge instructions were provided to the                            patient.                           - Resume previous diet.                           - Continue present medications (protonix twice                            daily)                           - Await pathology results                           - Cross sectional imaging of the liver - will                            discuss with patient Carlota Raspberry. Armbruster MD, MD 01/01/2017 2:57:09 PM This report has been signed electronically.

## 2017-01-01 NOTE — Op Note (Signed)
[place] Patient Name: William Fuller Procedure Date: 01/01/2017 2:07 PM MRN: [id number] Endoscopist: Remo Lipps P. Armbruster MD, MD Age: 46 Referring MD:  Date of Birth: [birth year] Gender: Male Account #: [id number] Procedure:                Colonoscopy Indications:              This is the patient's first colonoscopy,                            Hematochezia Medicines:                Monitored Anesthesia Care Procedure:                Pre-Anesthesia Assessment:                           - Prior to the procedure, a History and Physical                            was performed, and patient medications and                            allergies were reviewed. The patient's tolerance of                            previous anesthesia was also reviewed. The risks                            and benefits of the procedure and the sedation                            options and risks were discussed with the patient.                            All questions were answered, and informed consent                            was obtained. Prior Anticoagulants: The patient has                            taken no previous anticoagulant or antiplatelet                            agents. ASA Grade Assessment: II - A patient with                            mild systemic disease. After reviewing the risks                            and benefits, the patient was deemed in                            satisfactory condition to undergo the procedure.  After obtaining informed consent, the colonoscope                            was passed under direct vision. Throughout the                            procedure, the patient's blood pressure, pulse, and                            oxygen saturations were monitored continuously. The                            Colonoscope was introduced through the anus and                            advanced to the the terminal ileum, with                    identification of the appendiceal orifice and IC                            valve. The colonoscopy was performed without                            difficulty. The patient tolerated the procedure                            well. The quality of the bowel preparation was                            adequate. The terminal ileum, ileocecal valve,                            appendiceal orifice, and rectum were photographed. Scope In: 2:23:03 PM Scope Out: 2:43:17 PM Scope Withdrawal Time: 0 hours 18 minutes 21 seconds  Total Procedure Duration: 0 hours 20 minutes 14 seconds  Findings:                 The perianal and digital rectal examinations were                            normal.                           A 3 mm polyp was found in the sigmoid colon. The                            polyp was sessile. The polyp was removed with a                            cold snare. Resection and retrieval were complete.                           A single medium-sized angiodysplastic lesion was  found in the cecum.                           A localized area of pigmented, mildly nodular                            mucosa was found in the distal rectum. Biopsies                            were taken with a cold forceps for histology to                            ensure no adenomatous change.                           Internal hemorrhoids were found during                            retroflexion. The hemorrhoids were moderate.                           The terminal ileum appeared normal.                           The exam was otherwise without abnormality. Complications:            No immediate complications. Estimated blood loss:                            Minimal. Estimated Blood Loss:     Estimated blood loss was minimal. Impression:               - One 3 mm polyp in the sigmoid colon, removed with                            a cold snare. Resected and retrieved.                            - A single colonic angiodysplastic lesion.                           - Pigmented mucosa in the distal rectum. Biopsied.                           - Internal hemorrhoids.                           - The examined portion of the ileum was normal.                           - The examination was otherwise normal.                           Suspect rectal bleeding is due to hemorrhoids noted  on this exam. Recommendation:           - Patient has a contact number available for                            emergencies. The signs and symptoms of potential                            delayed complications were discussed with the                            patient. Return to normal activities tomorrow.                            Written discharge instructions were provided to the                            patient.                           - Resume previous diet.                           - Continue present medications.                           - Daily fiber supplement to treat hemorrhoids as                            needed                           - Await pathology results.                           - Repeat colonoscopy is recommended for                            surveillance. The colonoscopy date will be                            determined after pathology results from today's                            exam become available for review. Remo Lipps P. Armbruster MD, MD 01/01/2017 2:48:26 PM This report has been signed electronically.

## 2017-01-04 ENCOUNTER — Other Ambulatory Visit: Payer: Self-pay

## 2017-01-04 ENCOUNTER — Telehealth: Payer: Self-pay

## 2017-01-04 ENCOUNTER — Telehealth: Payer: Self-pay | Admitting: *Deleted

## 2017-01-04 DIAGNOSIS — I8500 Esophageal varices without bleeding: Secondary | ICD-10-CM

## 2017-01-04 NOTE — Telephone Encounter (Signed)
  Follow up Call-  Call back number 01/01/2017  Post procedure Call Back phone  # [phone]  Permission to leave phone message Yes  Some recent data might be hidden     Patient questions:  Do you have a fever, pain , or abdominal swelling? No. Pain Score  0 *  Have you tolerated food without any problems? Yes.    Have you been able to return to your normal activities? Yes.    Do you have any questions about your discharge instructions: Diet   No. Medications  No. Follow up visit  No.  Do you have questions or concerns about your Care? No.  Actions: * If pain score is 4 or above: No action needed, pain <4.

## 2017-01-04 NOTE — Telephone Encounter (Signed)
Message left

## 2017-01-04 NOTE — Telephone Encounter (Signed)
-----   Message from Manus Gunning, MD sent at 01/01/2017  4:22 PM EDT ----- Regarding: CT scan  Hi Almyra Free, This patient had an EGD today in which I thought he had some small esophageal varices. I'd like him to have a CT scan with IV and oral contrast to evaluate his liver, to rule out cirrhosis, and his upper abdomen to evaluate for presence of varices.   Can you help coordinate and call the patient sometime next week? Thanks much

## 2017-01-04 NOTE — Telephone Encounter (Signed)
Patient aware of CT abdomen scheduled for 6/8 at [facility] hospital. Patient will come by the office to pick up the prep instructions and contrast.

## 2017-01-08 ENCOUNTER — Encounter: Payer: Self-pay | Admitting: Gastroenterology

## 2017-01-08 ENCOUNTER — Ambulatory Visit (HOSPITAL_COMMUNITY)
Admission: RE | Admit: 2017-01-08 | Discharge: 2017-01-08 | Disposition: A | Discharge status: Home / Self Care | Payer: BC Managed Care – PPO | Source: Ambulatory Visit | Attending: Gastroenterology | Admitting: Gastroenterology

## 2017-01-08 DIAGNOSIS — K7689 Other specified diseases of liver: Secondary | ICD-10-CM | POA: Diagnosis not present

## 2017-01-08 DIAGNOSIS — I8500 Esophageal varices without bleeding: Secondary | ICD-10-CM | POA: Insufficient documentation

## 2017-01-08 MED ORDER — IOPAMIDOL (ISOVUE-300) INJECTION 61%
100.0000 mL | Freq: Once | INTRAVENOUS | Status: AC | PRN
  Administered 2017-01-08: 100 mL via INTRAVENOUS

## 2017-01-08 MED ORDER — IOPAMIDOL (ISOVUE-300) INJECTION 61%
INTRAVENOUS | Status: AC
  Filled 2017-01-08: qty 100

## 2017-01-15 ENCOUNTER — Other Ambulatory Visit: Payer: Self-pay

## 2017-01-15 DIAGNOSIS — R935 Abnormal findings on diagnostic imaging of other abdominal regions, including retroperitoneum: Secondary | ICD-10-CM

## 2017-01-15 DIAGNOSIS — K769 Liver disease, unspecified: Secondary | ICD-10-CM

## 2017-01-20 ENCOUNTER — Ambulatory Visit (HOSPITAL_COMMUNITY)
Admission: RE | Admit: 2017-01-20 | Discharge: 2017-01-20 | Disposition: A | Discharge status: Home / Self Care | Payer: BC Managed Care – PPO | Source: Ambulatory Visit | Attending: Gastroenterology | Admitting: Gastroenterology

## 2017-01-20 DIAGNOSIS — K769 Liver disease, unspecified: Secondary | ICD-10-CM | POA: Diagnosis present

## 2017-01-20 DIAGNOSIS — R935 Abnormal findings on diagnostic imaging of other abdominal regions, including retroperitoneum: Secondary | ICD-10-CM | POA: Insufficient documentation

## 2017-01-20 MED ORDER — GADOXETATE DISODIUM 0.25 MMOL/ML IV SOLN
10.0000 mL | Freq: Once | INTRAVENOUS | Status: AC | PRN
  Administered 2017-01-20: 10 mL via INTRAVENOUS

## 2017-02-22 ENCOUNTER — Encounter: Payer: Self-pay | Admitting: Gastroenterology

## 2017-02-22 ENCOUNTER — Ambulatory Visit (INDEPENDENT_AMBULATORY_CARE_PROVIDER_SITE_OTHER): Payer: BC Managed Care – PPO | Admitting: Gastroenterology

## 2017-02-22 VITALS — BP 132/60 | HR 60 | Ht 70.0 in | Wt 264.0 lb

## 2017-02-22 DIAGNOSIS — K7689 Other specified diseases of liver: Secondary | ICD-10-CM | POA: Diagnosis not present

## 2017-02-22 DIAGNOSIS — R110 Nausea: Secondary | ICD-10-CM

## 2017-02-22 DIAGNOSIS — K31A0 Gastric intestinal metaplasia, unspecified: Secondary | ICD-10-CM

## 2017-02-22 DIAGNOSIS — K3189 Other diseases of stomach and duodenum: Secondary | ICD-10-CM

## 2017-02-22 DIAGNOSIS — K2100 Gastro-esophageal reflux disease with esophagitis, without bleeding: Secondary | ICD-10-CM

## 2017-02-22 MED ORDER — PROMETHAZINE HCL 25 MG PO TABS: 25.0000 mg | ORAL_TABLET | Freq: Every day | ORAL | 1 refills | Status: DC

## 2017-02-22 NOTE — Progress Notes (Signed)
HPI :  46 year old male here here for a follow-up visit to discuss the following issues.   At the last visit he endorsed ongoing reflux symptoms and epigastric pain. He was placed on Protonix. 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 pylori negative. He also had a questionable small esophageal varices which led to a workup to rule out cirrhosis. CT scan showed no evidence of cirrhosis but he had an indeterminate lesion in the left lobe of his liver. 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   No FH of gastric cancer. Grandfather had colon cancer. No tobacco use. He has felt nauseated in the morning when he wakes up. Takes about an hour or so to get his appetite. He thinks ongoing for a while, on and off. He is taking protonix 1-2 times per day, depending on symptoms. He thinks it is controlling his reflux. He thinks it resolved his epigastric pain, overall feeling much better in this regard. He is prescribed diclofenac but is not taking. He has been ibuprofen for headaches, he thinks twice per month. Nausea doesn't happen every morning. He took zofran only once. No nausea with eating, no vomiting. No abdominal pains. He has had some headaches / throbbing in his head, on and off, he doesn't think nausea is related to nausea.   Recent workup EGD 01/01/2017 - 3cm hiatal hernia, LA grade A esophagitis, ? 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 Allergy   . Focal nodular hyperplasia of liver   . GERD (gastroesophageal reflux  disease)   . Hypertension   . Intestinal metaplasia of gastric mucosa      Past Surgical History:  Procedure Laterality Date  . HERNIA REPAIR     Family History  Problem Relation Age of Onset  . Hypertension Other   . CAD Other   . Breast cancer Mother   . Heart disease Mother   . Heart disease Brother   . Heart disease Maternal Grandmother   . Hypertension Maternal Grandmother   . Colon cancer Neg Hx    Social History  Substance Use Topics  . Smoking status: Former Smoker    Quit date: 06/12/2015  . Smokeless tobacco: Never Used  . Alcohol use No   Current Outpatient Prescriptions  Medication Sig Dispense Refill  . diclofenac (VOLTAREN) 75 MG EC tablet Take 1 tablet (75 mg total) by mouth 2 (two) times daily. 20 tablet 0  . fluticasone (FLONASE) 50 MCG/ACT nasal spray Place 2 sprays into both nostrils daily. 16 g 1  . levocetirizine (XYZAL) 5 MG tablet Take 1 tablet (5 mg total) by mouth every evening. 30 tablet 3  . ondansetron (ZOFRAN ODT) 8 MG disintegrating tablet Take 1 tablet (8 mg total) by mouth every 8 (eight) hours as needed for nausea or vomiting. 20 tablet 0  . pantoprazole (PROTONIX) 40 MG tablet Take 1 tablet (40 mg total) by mouth 2 (two) times daily. 120 tablet 1  . ranitidine (ZANTAC) 150 MG capsule Take 1 capsule (150 mg total) by mouth 2 (  two) times daily. 60 capsule 0  . promethazine (PHENERGAN) 25 MG tablet Take 1 tablet (25 mg total) by mouth at bedtime. 30 tablet 1   Current Facility-Administered Medications  Medication Dose Route Frequency Provider Last Rate Last Dose  . 0.9 %  sodium chloride infusion  500 mL Intravenous Continuous Jeran Hiltz, Renelda Loma, MD       No Known Allergies   Review of Systems: All systems reviewed and negative except where noted in HPI.   Lab Results  Component Value Date   WBC 6.2 11/05/2016   HGB 14.8 11/05/2016   HCT 44.2 11/05/2016   MCV 89.5 11/05/2016   PLT 551.0 (H) 11/05/2016    Lab Results    Component Value Date   CREATININE 1.15 11/05/2016   BUN 8 11/05/2016   NA 139 11/05/2016   K 3.7 11/05/2016   CL 104 11/05/2016   CO2 31 11/05/2016    Lab Results  Component Value Date   ALT 31 11/05/2016   AST 27 11/05/2016   ALKPHOS 42 11/05/2016   BILITOT 0.5 11/05/2016     Physical Exam: BP 132/60   Pulse 60   Ht 5\' 10"  (1.778 m)   Wt 264 lb (119.7 kg)   BMI 37.88 kg/m  Constitutional: Pleasant,well-developed, male in no acute distress. HEENT: Normocephalic and atraumatic. Conjunctivae are normal. No scleral icterus. Neck supple.  Cardiovascular: Normal rate, regular rhythm.  Pulmonary/chest: Effort normal and breath sounds normal. No wheezing, rales or rhonchi. Abdominal: Soft, nondistended, nontender. . There are no masses palpable. No hepatomegaly. Extremities: no edema Lymphadenopathy: No cervical adenopathy noted. Neurological: Alert and oriented to person place and time. Skin: Skin is warm and dry. No rashes noted. Psychiatric: Normal mood and affect. Behavior is normal.   ASSESSMENT AND PLAN: 46 year old male here for reassessment following issues:  Chronic nausea - no clear cause on endoscopy, imaging. Labs normal. Not taking any meds to cause this. He is not using Zofran, he can take this as needed during the day. His nausea appears mostly when he awakes in the morning, we'll try giving him Phenergan when he goes to bed at night see if this helps. If it persists he should contact me for reassessment. He does have intermittent headaches but they seem unrelated to his nausea.   GERD / esophagitis - much better control on Protonix at this time, he can take Protonix as needed moving forward.  Gastric intestinal metaplasia - I discussed with this is and potential increased risk for gastric cancer. He has no risk factors for gastric cancer, and we discussed that there is no surveillance recommendation for GIM in the [place] without risk factors. We may consider an EGD  in a few years to reassess this issue and perform mapping to determine the extent, which may influence future surveillance  FNH - noted incidentally on liver MRI. There is no evidence of cirrhosis or portal hypertension I reassured the patient, findings on EGD were likely normal variant. I reassured him regarding [place] finding this is a benign lesion, although we may consider repeat ultrasound in 6-12 months to assess stability. He agreed and verbalized understanding.   [place] Cellar, MD [facility] Pager [phone]

## 2017-02-22 NOTE — Patient Instructions (Signed)
If you are age 47 or older, your body mass index should be between 23-30. Your Body mass index is 37.88 kg/m. If this is out of the aforementioned range listed, please consider follow up with your Primary Care Provider.  If you are age 69 or younger, your body mass index should be between 19-25. Your Body mass index is 37.88 kg/m. If this is out of the aformentioned range listed, please consider follow up with your Primary Care Provider.   We have sent the following medications to your pharmacy for you to pick up at your convenience:  Phenergan  You will be due for an upper endoscopy in 1-2 years per Dr. Havery Moros.  You will also need a ultrasound in one year to re evaluate your liver.  Thank you.

## 2017-09-08 ENCOUNTER — Encounter: Payer: Self-pay | Admitting: Medical

## 2017-09-08 ENCOUNTER — Ambulatory Visit: Payer: BC Managed Care – PPO | Admitting: Medical

## 2017-09-08 ENCOUNTER — Ambulatory Visit (HOSPITAL_BASED_OUTPATIENT_CLINIC_OR_DEPARTMENT_OTHER)
Admission: RE | Admit: 2017-09-08 | Discharge: 2017-09-08 | Disposition: A | Discharge status: Home / Self Care | Payer: BC Managed Care – PPO | Source: Ambulatory Visit | Attending: Medical | Admitting: Medical

## 2017-09-08 VITALS — BP 138/80 | HR 95 | Temp 98.3°F | Resp 16 | Ht 70.0 in | Wt 265.0 lb

## 2017-09-08 DIAGNOSIS — M542 Cervicalgia: Secondary | ICD-10-CM

## 2017-09-08 DIAGNOSIS — G8929 Other chronic pain: Secondary | ICD-10-CM

## 2017-09-08 DIAGNOSIS — M25512 Pain in left shoulder: Secondary | ICD-10-CM | POA: Diagnosis not present

## 2017-09-08 DIAGNOSIS — M25511 Pain in right shoulder: Secondary | ICD-10-CM

## 2017-09-08 MED ORDER — PANTOPRAZOLE SODIUM 40 MG PO TBEC: 40.0000 mg | DELAYED_RELEASE_TABLET | Freq: Two times a day (BID) | ORAL | 1 refills | Status: DC

## 2017-09-08 MED ORDER — TRAMADOL HCL 50 MG PO TABS: 50.0000 mg | ORAL_TABLET | Freq: Three times a day (TID) | ORAL | 0 refills | Status: DC | PRN

## 2017-09-08 NOTE — Progress Notes (Signed)
Subjective:    Patient ID: William Fuller, male    DOB: [birth year], 47 y.o.   MRN: [id number]  HPI  Pt in with some bilateral shoulder pain. More left than on rt side(over a year of pain). Pt states no pain at rest. But if he does move shoulder/lift left upper ext pain increased. Not working out. Pain since weekend. On and off raking leaves.  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  Question whether or not this might be contributing to his pain now.  Pt stats in past had some neck pain that radiated to both shoulder last year. He never got that xray done. Patient still gets some occasional neck pain.  Last year may 2018 xray of rt shoulder.  Pt does not like to take nsaids since it bothers his stomach. So he stopped. He states he saw some bright red blood around that time. Pt had colonoscopy told had hemorrhoids.(I reviewed that report today. ) The patient stopped using any over-the-counter NSAIDs and the bright red blood in toilet went away.  No reoccurrence.  Not reporting any fatigue.   Pt job requires good shoulder range of motion. 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  Review of Systems  Constitutional: Negative for chills, fatigue and fever.  Respiratory: Negative for cough, chest tightness, shortness of breath and wheezing.   Cardiovascular: Negative for chest pain and palpitations.  Gastrointestinal: Negative for abdominal pain, diarrhea, nausea and vomiting.  Musculoskeletal: Negative for back pain.       See hpi.  Skin: Negative for rash.  Psychiatric/Behavioral: Negative for behavioral problems, confusion, dysphoric mood, hallucinations, sleep disturbance and suicidal ideas. The patient is not nervous/anxious.    Past Medical History:  Diagnosis Date  . Allergy   . Focal nodular hyperplasia of liver   . GERD (gastroesophageal reflux disease)   . Hypertension   . Intestinal metaplasia of gastric  mucosa      Social History   Socioeconomic History  . Marital status: Single    Spouse name: Not on file  . Number of children: Not on file  . Years of education: Not on file  . Highest education level: Not on file  Social Needs  . Financial resource strain: Not on file  . Food insecurity - worry: Not on file  . Food insecurity - inability: Not on file  . Transportation needs - medical: Not on file  . Transportation needs - non-medical: Not on file  Occupational History  . Not on file  Tobacco Use  . Smoking status: Former Smoker    Last attempt to quit: 06/12/2015    Years since quitting: 2.2  . Smokeless tobacco: Never Used  Substance and Sexual Activity  . Alcohol use: No    Alcohol/week: 0.0 oz  . Drug use: Yes    Types: Marijuana    Comment: pt smokes twice a week. Each time 2 joints.  . Sexual activity: Yes  Other Topics Concern  . Not on file  Social History Narrative  . Not on file    Past Surgical History:  Procedure Laterality Date  . HERNIA REPAIR      Family History  Problem Relation Age of Onset  . Hypertension Other   . CAD Other   . Breast cancer Mother   . Heart disease Mother   . Heart disease Brother   . Heart disease Maternal Grandmother   .  Hypertension Maternal Grandmother   . Colon cancer Neg Hx     No Known Allergies  Current Outpatient Medications on File Prior to Visit  Medication Sig Dispense Refill  . fluticasone (FLONASE) 50 MCG/ACT nasal spray Place 2 sprays into both nostrils daily. 16 g 1  . levocetirizine (XYZAL) 5 MG tablet Take 1 tablet (5 mg total) by mouth every evening. 30 tablet 3  . ondansetron (ZOFRAN ODT) 8 MG disintegrating tablet Take 1 tablet (8 mg total) by mouth every 8 (eight) hours as needed for nausea or vomiting. 20 tablet 0  . promethazine (PHENERGAN) 25 MG tablet Take 1 tablet (25 mg total) by mouth at bedtime. 30 tablet 1  . ranitidine (ZANTAC) 150 MG capsule Take 1 capsule (150 mg total) by mouth 2  (two) times daily. 60 capsule 0   Current Facility-Administered Medications on File Prior to Visit  Medication Dose Route Frequency Provider Last Rate Last Dose  . 0.9 %  sodium chloride infusion  500 mL Intravenous Continuous Armbruster, Carlota Raspberry, MD        BP 138/80   Pulse 95   Temp 98.3 F (36.8 C) (Oral)   Resp 16   Ht 5\' 10"  (1.778 m)   Wt 265 lb (120.2 kg)   SpO2 100%   BMI 38.02 kg/m       Objective:   Physical Exam  General Mental Status- Alert. General Appearance- Not in acute distress.   Skin General: Color- Normal Color. Moisture- Normal Moisture.  Neck Carotid Arteries- Normal color. Moisture- Normal Moisture. No carotid bruits. No JVD.  No mid cervical spine tenderness presently.  Chest and Lung Exam Auscultation: Breath Sounds:-Normal.  Cardiovascular Auscultation:Rythm- Regular. Murmurs & Other Heart Sounds:Auscultation of the heart reveals- No Murmurs.  Abdomen Inspection:-Inspeection Normal. Palpation/Percussion:Note:No mass. Palpation and Percussion of the abdomen reveal- Non Tender, Non Distended + BS, no rebound or guarding.   Neurologic Cranial Nerve exam:- CN III-XII intact(No nystagmus), symmetric smile. Strength:- 5/5 equal and symmetric strength both upper and lower extremities.  Bilateral shoulder exam-left shoulder has severe decreased range of motion with pain.  Can hardly abduct his left upper extremity at all.  Some direct tenderness to palpation of the biceps tendon.  Right shoulder has a little bit better range of motion but still has pain.  No direct severe tenderness to palpation over the shoulder itself.          Assessment & Plan:  For your history of neck pain and shoulder pain bilateral, I am going to write you tramadol.  I decided against NSAIDs due to GI side effect history.  I am going to write you off of work until Monday.  Hopefully sports medicine will give you some relief as you need to have good range of motion  for your work.  I do want you to go ahead and get x-ray of your c-spine and your left shoulder.  Not getting x-ray of your right shoulder since that was done past May.  Follow-up date to be determined after sports medicine referral.  [name], PA-C

## 2017-09-08 NOTE — Patient Instructions (Signed)
For your history of neck pain and shoulder pain bilateral, I am going to write you tramadol.  I decided against NSAIDs due to GI side effect history.  I am going to write you off of work until Monday.  Hopefully sports medicine will give you some relief as you need to have good range of motion for your work.  I do want you to go ahead and get x-ray of your c-spine and your left shoulder.  Not getting x-ray of your right shoulder since that was done past May.  Follow-up date to be determined after sports medicine referral.

## 2017-09-14 ENCOUNTER — Encounter: Payer: Self-pay | Admitting: Family Medicine

## 2017-09-14 ENCOUNTER — Ambulatory Visit: Payer: BC Managed Care – PPO | Admitting: Family Medicine

## 2017-09-14 DIAGNOSIS — M25512 Pain in left shoulder: Secondary | ICD-10-CM | POA: Diagnosis not present

## 2017-09-14 DIAGNOSIS — M25511 Pain in right shoulder: Secondary | ICD-10-CM

## 2017-09-14 DIAGNOSIS — G8929 Other chronic pain: Secondary | ICD-10-CM

## 2017-09-14 MED ORDER — NITROGLYCERIN 0.2 MG/HR TD PT24: MEDICATED_PATCH | TRANSDERMAL | 1 refills | Status: DC

## 2017-09-14 NOTE — Patient Instructions (Signed)
You have rotator cuff impingement Try to avoid painful activities (overhead activities, lifting with extended arm) as much as possible. Tylenol 500mg  three times a day as needed for pain. Topical capsaicin, biofreeze, salon pas may help as well. Nitro patches - 1/4th patch to left shoulder, change daily - after about 1 week you can do 1/4th patch to the right shoulder also. Subacromial injection may be beneficial to help with pain and to decrease inflammation. Consider physical therapy with transition to home exercise program. Do home exercise program with theraband and scapular stabilization exercises daily 3 sets of 10 once a day. If not improving at follow-up we will consider further imaging, injection, physical therapy. Follow up with me in 6 weeks but call me sooner if you're struggling.

## 2017-09-15 ENCOUNTER — Encounter: Payer: Self-pay | Admitting: Family Medicine

## 2017-09-15 DIAGNOSIS — M25511 Pain in right shoulder: Secondary | ICD-10-CM | POA: Insufficient documentation

## 2017-09-15 DIAGNOSIS — M25512 Pain in left shoulder: Secondary | ICD-10-CM

## 2017-09-15 NOTE — Progress Notes (Signed)
PCP and consultation requested by: [name], PA-C  Subjective:   HPI: Patient is a 47 y.o. male here for neck, bilateral shoulder pain.  Patient reports he had a remote accident about 10 years ago that preceded his bilateral shoulder and neck pain. Started with the right side but a couple years later involved left as well. Has had problems since then lateral shoulders with pain into neck and headaches. He previously tried oral NSAIDs, had a cortisone injection into one of his shoulders. Pain in neck area 4/10 but 7/10 in right shoulder, 0/10 left shoulder. Worse with sleeping on sides, overhead motions. Associated burning sensation in shoulders. No bowel/bladder dysfunction. No skin changes.  Past Medical History:  Diagnosis Date  . Allergy   . Focal nodular hyperplasia of liver   . GERD (gastroesophageal reflux disease)   . Hypertension   . Intestinal metaplasia of gastric mucosa     Current Outpatient Medications on File Prior to Visit  Medication Sig Dispense Refill  . fluticasone (FLONASE) 50 MCG/ACT nasal spray Place 2 sprays into both nostrils daily. 16 g 1  . levocetirizine (XYZAL) 5 MG tablet Take 1 tablet (5 mg total) by mouth every evening. 30 tablet 3  . ondansetron (ZOFRAN ODT) 8 MG disintegrating tablet Take 1 tablet (8 mg total) by mouth every 8 (eight) hours as needed for nausea or vomiting. 20 tablet 0  . pantoprazole (PROTONIX) 40 MG tablet Take 1 tablet (40 mg total) by mouth 2 (two) times daily. 120 tablet 1  . promethazine (PHENERGAN) 25 MG tablet Take 1 tablet (25 mg total) by mouth at bedtime. 30 tablet 1  . ranitidine (ZANTAC) 150 MG capsule Take 1 capsule (150 mg total) by mouth 2 (two) times daily. 60 capsule 0  . traMADol (ULTRAM) 50 MG tablet Take 1 tablet (50 mg total) by mouth every 8 (eight) hours as needed. 15 tablet 0   Current Facility-Administered Medications on File Prior to Visit  Medication Dose Route Frequency Provider Last Rate Last  Dose  . 0.9 %  sodium chloride infusion  500 mL Intravenous Continuous Armbruster, Carlota Raspberry, MD        Past Surgical History:  Procedure Laterality Date  . HERNIA REPAIR      No Known Allergies  Social History   Socioeconomic History  . Marital status: Single    Spouse name: Not on file  . Number of children: Not on file  . Years of education: Not on file  . Highest education level: Not on file  Social Needs  . Financial resource strain: Not on file  . Food insecurity - worry: Not on file  . Food insecurity - inability: Not on file  . Transportation needs - medical: Not on file  . Transportation needs - non-medical: Not on file  Occupational History  . Not on file  Tobacco Use  . Smoking status: Former Smoker    Last attempt to quit: 06/12/2015    Years since quitting: 2.2  . Smokeless tobacco: Never Used  Substance and Sexual Activity  . Alcohol use: No    Alcohol/week: 0.0 oz  . Drug use: Yes    Types: Marijuana    Comment: pt smokes twice a week. Each time 2 joints.  . Sexual activity: Yes  Other Topics Concern  . Not on file  Social History Narrative  . Not on file    Family History  Problem Relation Age of Onset  . Hypertension Other   .  CAD Other   . Breast cancer Mother   . Heart disease Mother   . Heart disease Brother   . Heart disease Maternal Grandmother   . Hypertension Maternal Grandmother   . Colon cancer Neg Hx     BP (!) 133/92   Pulse 63   Ht 5\' 11"  (1.803 m)   Wt 265 lb (120.2 kg)   BMI 36.96 kg/m   Review of Systems: See HPI above.     Objective:  Physical Exam:  Gen: NAD, comfortable in exam room  Neck: No gross deformity, swelling, bruising. No TTP.  No midline/bony TTP. FROM with mild pain on extension. BUE strength 5/5.   Sensation intact to light touch.   2+ equal reflexes in biceps, 1+ in triceps, brachioradialis tendons. NV intact distal BUEs.  Right shoulder: No swelling, ecchymoses.  No gross deformity. No  TTP AC joint, biceps tendon. FROM with painful arc. Positive Hawkins, Neers. Negative Yergasons. Strength 5/5 with empty can and resisted internal/external rotation.  Pain empty can. Negative apprehension. NV intact distally.  Left shoulder: No swelling, ecchymoses.  No gross deformity. No TTP. FROM with negative painful arc. Positive Hawkins, negative Neers. Negative Yergasons. Strength 5/5 with empty can and resisted internal/external rotation. Negative apprehension. NV intact distally.   Assessment & Plan:  1. Bilateral shoulder pain - exam consistent with rotator cuff impingement with likely secondary trapezius strain/spasms.  He will start a home exercise program and nitro patches.  Tylenol, topical medications.  Avoid NSAIDs - recently noticed blood in stool with regular usage.  Consider physical therapy, advanced imaging, injection if not improving.  F/u in 6 weeks.

## 2017-09-15 NOTE — Assessment & Plan Note (Signed)
exam consistent with rotator cuff impingement with likely secondary trapezius strain/spasms.  He will start a home exercise program and nitro patches.  Tylenol, topical medications.  Avoid NSAIDs - recently noticed blood in stool with regular usage.  Consider physical therapy, advanced imaging, injection if not improving.  F/u in 6 weeks.

## 2017-09-22 ENCOUNTER — Encounter: Payer: Self-pay | Admitting: Family Medicine

## 2017-09-22 ENCOUNTER — Ambulatory Visit: Payer: BC Managed Care – PPO | Admitting: Family Medicine

## 2017-09-22 VITALS — BP 155/87 | HR 97 | Ht 71.0 in | Wt 264.0 lb

## 2017-09-22 DIAGNOSIS — M25511 Pain in right shoulder: Secondary | ICD-10-CM | POA: Diagnosis not present

## 2017-09-22 DIAGNOSIS — G8929 Other chronic pain: Secondary | ICD-10-CM

## 2017-09-22 DIAGNOSIS — M25512 Pain in left shoulder: Secondary | ICD-10-CM | POA: Diagnosis not present

## 2017-09-22 MED ORDER — METHYLPREDNISOLONE ACETATE 40 MG/ML IJ SUSP
40.0000 mg | Freq: Once | INTRAMUSCULAR | Status: AC
  Administered 2017-09-22: 40 mg via INTRA_ARTICULAR

## 2017-09-22 NOTE — Progress Notes (Signed)
PCP and consultation requested by: [name], PA-C  Subjective:   HPI: Patient is a 47 y.o. male here for neck, bilateral shoulder pain.  2/12: Patient reports he had a remote accident about 10 years ago that preceded his bilateral shoulder and neck pain. Started with the right side but a couple years later involved left as well. Has had problems since then lateral shoulders with pain into neck and headaches. He previously tried oral NSAIDs, had a cortisone injection into one of his shoulders. Pain in neck area 4/10 but 7/10 in right shoulder, 0/10 left shoulder. Worse with sleeping on sides, overhead motions. Associated burning sensation in shoulders. No bowel/bladder dysfunction. No skin changes.  2/20: Patient returns for cortisone injection into right shoulder.  Past Medical History:  Diagnosis Date  . Allergy   . Focal nodular hyperplasia of liver   . GERD (gastroesophageal reflux disease)   . Hypertension   . Intestinal metaplasia of gastric mucosa     Current Outpatient Medications on File Prior to Visit  Medication Sig Dispense Refill  . fluticasone (FLONASE) 50 MCG/ACT nasal spray Place 2 sprays into both nostrils daily. 16 g 1  . levocetirizine (XYZAL) 5 MG tablet Take 1 tablet (5 mg total) by mouth every evening. 30 tablet 3  . nitroGLYCERIN (NITRODUR - DOSED IN MG/24 HR) 0.2 mg/hr patch Apply 1/4th patch to affected shoulder, change daily - can use on other shoulder as well 30 patch 1  . ondansetron (ZOFRAN ODT) 8 MG disintegrating tablet Take 1 tablet (8 mg total) by mouth every 8 (eight) hours as needed for nausea or vomiting. 20 tablet 0  . pantoprazole (PROTONIX) 40 MG tablet Take 1 tablet (40 mg total) by mouth 2 (two) times daily. 120 tablet 1  . promethazine (PHENERGAN) 25 MG tablet Take 1 tablet (25 mg total) by mouth at bedtime. 30 tablet 1  . ranitidine (ZANTAC) 150 MG capsule Take 1 capsule (150 mg total) by mouth 2 (two) times daily. 60 capsule 0   . traMADol (ULTRAM) 50 MG tablet Take 1 tablet (50 mg total) by mouth every 8 (eight) hours as needed. 15 tablet 0   Current Facility-Administered Medications on File Prior to Visit  Medication Dose Route Frequency Provider Last Rate Last Dose  . 0.9 %  sodium chloride infusion  500 mL Intravenous Continuous Armbruster, Carlota Raspberry, MD        Past Surgical History:  Procedure Laterality Date  . HERNIA REPAIR      No Known Allergies  Social History   Socioeconomic History  . Marital status: Single    Spouse name: Not on file  . Number of children: Not on file  . Years of education: Not on file  . Highest education level: Not on file  Social Needs  . Financial resource strain: Not on file  . Food insecurity - worry: Not on file  . Food insecurity - inability: Not on file  . Transportation needs - medical: Not on file  . Transportation needs - non-medical: Not on file  Occupational History  . Not on file  Tobacco Use  . Smoking status: Former Smoker    Last attempt to quit: 06/12/2015    Years since quitting: 2.2  . Smokeless tobacco: Never Used  Substance and Sexual Activity  . Alcohol use: No    Alcohol/week: 0.0 oz  . Drug use: Yes    Types: Marijuana    Comment: pt smokes twice a week. Each time 2  joints.  . Sexual activity: Yes  Other Topics Concern  . Not on file  Social History Narrative  . Not on file    Family History  Problem Relation Age of Onset  . Hypertension Other   . CAD Other   . Breast cancer Mother   . Heart disease Mother   . Heart disease Brother   . Heart disease Maternal Grandmother   . Hypertension Maternal Grandmother   . Colon cancer Neg Hx     BP (!) 155/87   Pulse 97   Ht 5\' 11"  (1.803 m)   Wt 264 lb (119.7 kg)   BMI 36.82 kg/m   Review of Systems: See HPI above.     Objective:  Physical Exam:  Exam not repeated today. Gen: NAD, comfortable in exam room  Neck: No gross deformity, swelling, bruising. No TTP.  No  midline/bony TTP. FROM with mild pain on extension. BUE strength 5/5.   Sensation intact to light touch.   2+ equal reflexes in biceps, 1+ in triceps, brachioradialis tendons. NV intact distal BUEs.  Right shoulder: No swelling, ecchymoses.  No gross deformity. No TTP AC joint, biceps tendon. FROM with painful arc. Positive Hawkins, Neers. Negative Yergasons. Strength 5/5 with empty can and resisted internal/external rotation.  Pain empty can. Negative apprehension. NV intact distally.  Left shoulder: No swelling, ecchymoses.  No gross deformity. No TTP. FROM with negative painful arc. Positive Hawkins, negative Neers. Negative Yergasons. Strength 5/5 with empty can and resisted internal/external rotation. Negative apprehension. NV intact distally.   Assessment & Plan:  1. Bilateral shoulder pain - 2/2 rotator cuff impingement with secondary trapezius strain/spasms.  Subacromial injection given today into right shoulder.  Tylenol, topical medications.  Consider PT, left subacromial injection, advanced imaging.  F/u in 6 weeks.  After informed written consent timeout was performed, patient was seated on exam table. Right shoulder was prepped with alcohol swab and utilizing posterior approach, patient's right subacromial space was injected with 3:1 bupivicaine: depomedrol. Patient tolerated the procedure well without immediate complications.

## 2017-09-22 NOTE — Assessment & Plan Note (Signed)
2/2 rotator cuff impingement with secondary trapezius strain/spasms.  Subacromial injection given today into right shoulder.  Tylenol, topical medications.  Consider PT, left subacromial injection, advanced imaging.  F/u in 6 weeks.  After informed written consent timeout was performed, patient was seated on exam table. Right shoulder was prepped with alcohol swab and utilizing posterior approach, patient's right subacromial space was injected with 3:1 bupivicaine: depomedrol. Patient tolerated the procedure well without immediate complications.

## 2017-10-05 ENCOUNTER — Other Ambulatory Visit: Payer: Self-pay

## 2017-10-05 ENCOUNTER — Ambulatory Visit: Payer: BC Managed Care – PPO | Attending: Medical

## 2017-10-05 DIAGNOSIS — M25611 Stiffness of right shoulder, not elsewhere classified: Secondary | ICD-10-CM | POA: Diagnosis present

## 2017-10-05 DIAGNOSIS — M25612 Stiffness of left shoulder, not elsewhere classified: Secondary | ICD-10-CM | POA: Insufficient documentation

## 2017-10-05 DIAGNOSIS — G8929 Other chronic pain: Secondary | ICD-10-CM | POA: Insufficient documentation

## 2017-10-05 DIAGNOSIS — M25512 Pain in left shoulder: Secondary | ICD-10-CM | POA: Diagnosis present

## 2017-10-05 DIAGNOSIS — M25511 Pain in right shoulder: Secondary | ICD-10-CM | POA: Diagnosis not present

## 2017-10-05 NOTE — Therapy (Addendum)
[place], [place], [address] Phone: [phone]   Fax:  [phone]  Physical Therapy Evaluation  Patient Details  Name: William Fuller MRN: [id number] Date of Birth: [birth year] Referring Provider: Karlton Lemon, MD   Encounter Date: 10/05/2017  PT End of Session - 10/05/17 0801    Visit Number  1    Number of Visits  12    Date for PT Re-Evaluation  11/12/17    Authorization Type  BCBS    PT Start Time  0800    PT Stop Time  0840    PT Time Calculation (min)  40 min    Activity Tolerance  Patient tolerated treatment well    Behavior During Therapy  [facility] for tasks assessed/performed       Past Medical History:  Diagnosis Date  . Allergy   . Focal nodular hyperplasia of liver   . GERD (gastroesophageal reflux disease)   . Hypertension   . Intestinal metaplasia of gastric mucosa     Past Surgical History:  Procedure Laterality Date  . HERNIA REPAIR      There were no vitals filed for this visit.   Subjective Assessment - 10/05/17 0805    Subjective  He reports impingeent in shoulders. No pain now.  Episodes with changing locations of pain  varies from both to either shoulder.     He recieved injection in Rt shoulder .  Weeek after pain stopped.    Last shoulder pain was a week ago.   Pain depends on arm movement.       How long can you sit comfortably?  NA    How long can you stand comfortably?  NA    How long can you walk comfortably?  NA    Diagnostic tests  Xrays , negative    Patient Stated Goals  He would like pain to not return.     Currently in Pain?  No/denies    Pain Score  0-No pain    Pain Location  Shoulder    Pain Orientation  Right;Left    Pain Descriptors / Indicators  Sharp    Pain Type  Chronic pain    Pain Onset  More than a month ago    Pain Frequency  Intermittent    Aggravating Factors   reaching overhead and behind back.     Pain Relieving Factors  rest,    modalities don't help    Multiple Pain Sites  No         [facility] PT Assessment - 10/05/17 0001      Assessment   Medical Diagnosis  bilateral shoulder impingement    Referring Provider  Karlton Lemon, MD    Onset Date/Surgical Date  -- 2010 started    Hand Dominance  Right    Next MD Visit  As needed    Prior Therapy  No      Precautions   Precautions  None      Restrictions   Weight Bearing Restrictions  No      Balance Screen   Has the patient fallen in the past 6 months  No      Prior Function   Level of Independence  Independent      Cognition   Overall Cognitive Status  Within Functional Limits for tasks assessed      ROM / Strength   AROM / PROM / Strength  AROM;PROM;Strength      AROM  Overall AROM Comments  reaching behind back   RT to lumbar spin e Lt to upper lumbar spine  and ER touches T2-3    AROM Assessment Site  Shoulder    Right/Left Shoulder  Right;Left    Right Shoulder Flexion  140 Degrees mild ant pain    Right Shoulder ABduction  140 Degrees    Right Shoulder Internal Rotation  38 Degrees    Right Shoulder External Rotation  110 Degrees    Right Shoulder Horizontal ABduction  23 Degrees    Right Shoulder Horizontal  ADduction  90 Degrees    Left Shoulder Flexion  140 Degrees mild ant pain    Left Shoulder ABduction  150 Degrees    Left Shoulder Internal Rotation  38 Degrees    Left Shoulder External Rotation  108 Degrees    Left Shoulder Horizontal ABduction  22 Degrees    Left Shoulder Horizontal ADduction  95 Degrees      Strength   Overall Strength Comments  Normal UE strength              Objective measurements completed on examination: See above findings.                PT Short Term Goals - 10/05/17 0802      PT SHORT TERM GOAL #1   Title  He will be independent with initial HEP    Time  3    Period  Weeks    Status  New      PT SHORT TERM GOAL #2   Title  He will report pain decr   40 % in RT shoulder  with use at home and work    Time  3    Period  Weeks    Status  New      PT SHORT TERM GOAL #3   Title  His IR ROM will incr to 45 degrees bilaterlaly    Time  3    Period  Weeks    Status  New        PT Long Term Goals - 10/05/17 0803      PT LONG TERM GOAL #1   Title  He will be indpendent with all HEP issued    Time  6    Period  Weeks    Status  New      PT LONG TERM GOAL #2   Title  He will report 75% decr in RT shoulder pain with use at work    Time  6    Period  Weeks    Status  New      PT LONG TERM GOAL #3   Title  He will improve Hor add active to 100 degrees bilaterally    Time  6    Period  Weeks    Status  New      PT LONG TERM GOAL #4   Title  He will improve IR so able to reach lower thoracic spine bilaterally to ease pain    Time  6    Period  Weeks    Status  New      PT LONG TERM GOAL #5   Title  active flexion will improve to 160 degrees to decr pain with overhead reaching    Time  6    Period  Weeks    Status  New             Plan - 10/05/17 3785  Clinical Impression Statement  William Fuller presents today with no pain as injection gave significnat benefit. He has stiffness of both shoulders  and some end rang pain with ER and reaching behind back.   If he stretches regularly and keep scapula strength good he should have less pain.     Clinical Presentation  Stable    Clinical Decision Making  Low    Rehab Potential  Good    PT Frequency  2x / week    PT Duration  6 weeks    PT Treatment/Interventions  Cryotherapy;Iontophoresis 58m/ml Dexamethasone;Moist Heat;Ultrasound;Therapeutic exercise;Patient/family education;Manual techniques;Dry needling;Passive range of motion;Taping    PT Next Visit Plan  REveiew HEP and add as neded , Manual for ROm , ionto if pain.     PT Home Exercise Plan  hor add and IR stretching.   band for hor abduct and ER with scapula retraction    Consulted and Agree with Plan of Care  Patient       Patient  will benefit from skilled therapeutic intervention in order to improve the following deficits and impairments:  Pain, Decreased range of motion, Impaired UE functional use  Visit Diagnosis: Chronic right shoulder pain  Chronic left shoulder pain  Stiffness of right shoulder, not elsewhere classified  Stiffness of left shoulder, not elsewhere classified     Problem List Patient Active Problem List   Diagnosis Date Noted  . Bilateral shoulder pain 09/15/2017    CDarrel HooverPT 10/05/2017, 8:48 AM  [facility] [address]Phone: [phone]  Fax:  [phone] Name: HKorby RatayMRN: [id number] of Birth: 4Sep 25, 1972PHYSICAL THERAPY DISCHARGE SUMMARY  Visits from Start of Care:  1  Current functional level related to goals / functional outcomes: Unknown as he did not return due to insurance reasons   Remaining deficits: Unknown   Education / Equipment: NA  Plan: Patient agrees to discharge.  Patient goals were not met. Patient is being discharged due to financial reasons.  ?????    SNoralee StainPT  12/14/17

## 2017-10-11 ENCOUNTER — Ambulatory Visit: Payer: BC Managed Care – PPO

## 2017-10-18 ENCOUNTER — Encounter: Payer: BC Managed Care – PPO | Admitting: Physical Therapy

## 2017-10-25 ENCOUNTER — Encounter: Payer: BC Managed Care – PPO | Admitting: Physical Therapy

## 2017-10-26 ENCOUNTER — Ambulatory Visit: Payer: BC Managed Care – PPO | Admitting: Family Medicine

## 2017-11-01 ENCOUNTER — Encounter: Payer: BC Managed Care – PPO | Admitting: Physical Therapy

## 2018-01-04 ENCOUNTER — Telehealth: Payer: Self-pay

## 2018-01-04 NOTE — Telephone Encounter (Signed)
Called patient and lvm, also mailed reminder letter that it is time to schedule follow up US.

## 2018-01-04 NOTE — Telephone Encounter (Signed)
-----   Message from Doristine Counter, RN sent at 01/21/2017 11:07 AM EDT ----- Check with patient, see about having a 1 year [place] RUQ follow up on liver (FNH)-follicular nodular hyperplasia.

## 2018-01-07 ENCOUNTER — Telehealth: Payer: Self-pay | Admitting: *Deleted

## 2018-01-07 DIAGNOSIS — R932 Abnormal findings on diagnostic imaging of liver and biliary tract: Secondary | ICD-10-CM

## 2018-01-07 NOTE — Telephone Encounter (Signed)
Patient has been scheduled for RUQ limited abdominal ultrasound (confirmed this was correct test with Dr Havery Moros) at [facility] radiology on 02/08/18 at 9:00 am, 8:45 am arrival for follow up of liver lesion seen on MRI liver back in 2018. NPO midnight night before test.  I have left a message for patient to call back.

## 2018-01-07 NOTE — Telephone Encounter (Signed)
-----   Message from William Fuller, [place] sent at 01/07/2018 12:58 PM EDT ----- Last one Jan :). Recall for repeat liver US due in July.

## 2018-01-10 NOTE — Telephone Encounter (Signed)
Left voicemail for patient to call back. 

## 2018-01-11 NOTE — Telephone Encounter (Signed)
LM on pt's cell phone and home number to call back re: U/S scheduled in July. Letter with date, time and instructions also mailed to pt.

## 2018-01-13 NOTE — Telephone Encounter (Signed)
Patient returning phone call best call back # [phone].

## 2018-01-13 NOTE — Telephone Encounter (Signed)
Called and spoke to pt. Relayed date and time and instructions for procedure.  Pt expressed understanding.

## 2018-02-08 ENCOUNTER — Telehealth: Payer: Self-pay | Admitting: Gastroenterology

## 2018-02-08 ENCOUNTER — Ambulatory Visit (HOSPITAL_COMMUNITY)
Admission: RE | Admit: 2018-02-08 | Discharge: 2018-02-08 | Disposition: A | Discharge status: Home / Self Care | Payer: BC Managed Care – PPO | Source: Ambulatory Visit | Attending: Gastroenterology | Admitting: Gastroenterology

## 2018-02-08 DIAGNOSIS — R932 Abnormal findings on diagnostic imaging of liver and biliary tract: Secondary | ICD-10-CM | POA: Diagnosis not present

## 2018-02-08 NOTE — Telephone Encounter (Signed)
Pt returned your call regarding results and would like a call back.

## 2018-02-08 NOTE — Telephone Encounter (Signed)
See result note.  

## 2018-04-08 ENCOUNTER — Encounter (INDEPENDENT_AMBULATORY_CARE_PROVIDER_SITE_OTHER): Payer: Self-pay

## 2018-04-08 ENCOUNTER — Ambulatory Visit (INDEPENDENT_AMBULATORY_CARE_PROVIDER_SITE_OTHER): Payer: BC Managed Care – PPO | Admitting: Gastroenterology

## 2018-04-08 ENCOUNTER — Encounter: Payer: Self-pay | Admitting: Gastroenterology

## 2018-04-08 ENCOUNTER — Other Ambulatory Visit (INDEPENDENT_AMBULATORY_CARE_PROVIDER_SITE_OTHER): Payer: BC Managed Care – PPO

## 2018-04-08 VITALS — BP 130/72 | HR 84 | Ht 70.0 in | Wt 259.0 lb

## 2018-04-08 DIAGNOSIS — R110 Nausea: Secondary | ICD-10-CM | POA: Diagnosis not present

## 2018-04-08 DIAGNOSIS — K219 Gastro-esophageal reflux disease without esophagitis: Secondary | ICD-10-CM

## 2018-04-08 DIAGNOSIS — K7689 Other specified diseases of liver: Secondary | ICD-10-CM

## 2018-04-08 DIAGNOSIS — K3189 Other diseases of stomach and duodenum: Secondary | ICD-10-CM

## 2018-04-08 DIAGNOSIS — G479 Sleep disorder, unspecified: Secondary | ICD-10-CM

## 2018-04-08 DIAGNOSIS — G8929 Other chronic pain: Secondary | ICD-10-CM

## 2018-04-08 DIAGNOSIS — R51 Headache: Secondary | ICD-10-CM

## 2018-04-08 DIAGNOSIS — K31A0 Gastric intestinal metaplasia, unspecified: Secondary | ICD-10-CM

## 2018-04-08 LAB — HEPATIC FUNCTION PANEL
ALT: 47 U/L (ref 0–53)
AST: 27 U/L (ref 0–37)
Albumin: 4.5 g/dL (ref 3.5–5.2)
Alkaline Phosphatase: 47 U/L (ref 39–117)
BILIRUBIN DIRECT: 0.1 mg/dL (ref 0.0–0.3)
BILIRUBIN TOTAL: 0.4 mg/dL (ref 0.2–1.2)
Total Protein: 7.9 g/dL (ref 6.0–8.3)

## 2018-04-08 MED ORDER — PANTOPRAZOLE SODIUM 40 MG PO TBEC: 40.0000 mg | DELAYED_RELEASE_TABLET | Freq: Two times a day (BID) | ORAL | 1 refills | Status: DC

## 2018-04-08 NOTE — Patient Instructions (Addendum)
If you are age 49 or older, your body mass index should be between 23-30. Your Body mass index is 37.16 kg/m. If this is out of the aforementioned range listed, please consider follow up with your Primary Care Provider.  If you are age 72 or younger, your body mass index should be between 19-25. Your Body mass index is 37.16 kg/m. If this is out of the aformentioned range listed, please consider follow up with your Primary Care Provider.   Your provider has requested that you go to the basement level for lab work before leaving today. Press "B" on the elevator. The lab is located at the first door on the left as you exit the elevator. LFTs  We have sent the following medications to your pharmacy for you to pick up at your convenience: Protonix  You have been scheduled for an MRI at [facility] on    . Your appointment time is              . Please arrive 30 minutes prior to your appointment time for registration purposes. Please make certain not to have anything to eat or drink 6 hours prior to your test. In addition, if you have any metal in your body, have a pacemaker or defibrillator, please be sure to let your ordering physician know. This test typically takes 45 minutes to 1 hour to complete. Should you need to reschedule, please call [phone] to do so.  Follow up with your primary care physician regarding headaches and sleep disturbance.  May need sleep study.  Follow up in one year.  Thank you for choosing me and [facility].   [place] Cellar, MD

## 2018-04-08 NOTE — Progress Notes (Signed)
HPI :  47 year old male here for follow-up visit.  He had a CT scan performed in June of last year showing an indeterminate lesion in the left lower liver, no evidence of cirrhosis otherwise. A follow-up liver MRI showed a 1.6 cm suspected FNH. He had a follow-up ultrasound in July of this year which showed a normal liver and this lesion was not seen. He denies any history of jaundice. No abdominal pains which bother him.  He does have some periodic nausea which bothers him, states it bothers him for a few days in a row and then he will not be bothered for a few days - no clear triggers although he seems to notice it mostly before he eats, not after. This is not limiting his eating however, is not vomiting, eating normally. He had been taking some Phenergan at night which helps reduce his morning nausea. He does have headaches which bother him occasionally as well, headaches bother him for a few days at a time, doesn't take much for them, he is not sure if they're related to his nausea or not. He does have poor sleep with insomnia, he does snore at night, had been recommended for him to have a sleep study in the past which has not yet had done. He takes Protonix once every other day or so, this appears to control his reflux symptoms pretty well.  He had a prior EGD in June 2018 showing mild esophagitis, questionable trace varices, and gastritis. Cross sectional imaging showed no evidence of cirrhosis or other pathology other than the liver lesion. He tested negative for H. Pylori but did have focal gastric intestinal metaplasia noted on biopsies. He denies any family history of stomach cancer.  [place] 02/08/2018 - [place] not identified  Prior workup EGD 01/01/2017 - 3cm hiatal hernia, LA grade A esophagitis, ? 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 Allergy   . Focal nodular hyperplasia of liver   . GERD (gastroesophageal reflux disease)   . Hypertension   . Intestinal metaplasia of gastric mucosa      Past Surgical History:  Procedure Laterality Date  . HERNIA REPAIR     Family History  Problem Relation Age of Onset  . Hypertension Other   . CAD Other   . Breast cancer Mother   . Heart disease Mother   . Heart disease Brother   . Heart disease Maternal Grandmother   . Hypertension Maternal Grandmother   . Colon cancer Neg Hx    Social History   Tobacco Use  . Smoking status: Former Smoker    Last attempt to quit: 06/12/2015    Years since quitting: 2.8  . Smokeless tobacco: Never Used  Substance Use Topics  . Alcohol use: No    Alcohol/week: 0.0 standard drinks  . Drug use: Yes    Types: Marijuana    Comment: pt smokes twice a week. Each time 2 joints.   Current Outpatient Medications  Medication Sig Dispense Refill  . levocetirizine (XYZAL) 5 MG tablet Take 1 tablet (5 mg total) by mouth every evening. 30 tablet 3  . ondansetron (ZOFRAN ODT) 8 MG  disintegrating tablet Take 1 tablet (8 mg total) by mouth every 8 (eight) hours as needed for nausea or vomiting. 20 tablet 0  . ranitidine (ZANTAC) 150 MG capsule Take 1 capsule (150 mg total) by mouth 2 (two) times daily. 60 capsule 0  . pantoprazole (PROTONIX) 40 MG tablet Take 1 tablet (40 mg total) by mouth 2 (two) times daily. (Patient not taking: Reported on 04/08/2018) 120 tablet 1  . traMADol (ULTRAM) 50 MG tablet Take 1 tablet (50 mg total) by mouth every 8 (eight) hours as needed. (Patient not taking: Reported on 04/08/2018) 15 tablet 0   Current Facility-Administered Medications  Medication Dose Route Frequency Provider Last Rate Last Dose  . 0.9 %  sodium chloride  infusion  500 mL Intravenous Continuous [place] Hildebrant, Carlota Raspberry, MD       No Known Allergies   Review of Systems: All systems reviewed and negative except where noted in HPI.   Lab Results  Component Value Date   ALT 31 11/05/2016   AST 27 11/05/2016   ALKPHOS 42 11/05/2016   BILITOT 0.5 11/05/2016    Lab Results  Component Value Date   CREATININE 1.15 11/05/2016   BUN 8 11/05/2016   NA 139 11/05/2016   K 3.7 11/05/2016   CL 104 11/05/2016   CO2 31 11/05/2016    Lab Results  Component Value Date   ALT 31 11/05/2016   AST 27 11/05/2016   ALKPHOS 42 11/05/2016   BILITOT 0.5 11/05/2016     Physical Exam: BP 130/72 (BP Location: Left Arm, Patient Position: Sitting, Cuff Size: Normal)   Pulse 84 Comment: irregular  Ht 5\' 10"  (1.778 m) Comment: height measured without shoes  Wt 259 lb (117.5 kg)   BMI 37.16 kg/m  Constitutional: Pleasant,well-developed, male in no acute distress. HEENT: Normocephalic and atraumatic. Conjunctivae are normal. No scleral icterus. Neck supple.  Cardiovascular: Normal rate, regular rhythm.  Pulmonary/chest: Effort normal and breath sounds normal. No wheezing, rales or rhonchi. Abdominal: Soft, nondistended, nontender. There are no masses palpable. No hepatomegaly. Extremities: no edema Lymphadenopathy: No cervical adenopathy noted. Neurological: Alert and oriented to person place and time. Skin: Skin is warm and dry. No rashes noted. Psychiatric: Normal mood and affect. Behavior is normal.   ASSESSMENT AND PLAN: 47 year old male here for reassessment following issues:  Focal nodular hyperplasia - noted incidentally on liver MRI. There is no evidence of cirrhosis or portal hypertension I reassured the patient, findings on EGD were likely normal variant. I reassured him regarding [place] finding this is a benign lesion, although we'll repeat his LFTs to ensure stable and I think repeat cross-sectional imaging is reasonable now year out from when  this was initially seen to ensure stable. Unfortunately his ultrasound did not see this lesion very well, will refer for another MRI, be it open MRI given his claustrophobia. If MRI shows stability of the lesion then no further surveillance will be needed.  GERD - no history of Barrett's esophagus, doing well on Protonix once every other day, he will continue this as needed. No history of renal issues.  Gastric intestinal metaplasia - we discussed this incidental finding on EGD, and how this can theoretically can increase the risk for gastric cancer although the risk is thought through be quite low in our [place]. There are no guidelines on surveillance recommendations for this. We may consider an EGD 2-3 years from his last exam for surveillance, to see the extent of where this is located, as if  he has extensive gastric intestinal metaplasia may continue surveillance over time.Marland Kitchen He otherwise has no risk factors for gastric cancer.  Chronic nausea / chronic headaches / insomnia - ongoing periodic nausea, often when he wakes in the morning, she also has poor sleep and chronic headaches. I think a sleep study may be reasonable to rule out sleep apnea, I advised him to follow with his primary care discuss this. He also may benefit from a preventative regimen to reduce his headaches, and see if that would reduce his nausea, as I don't see any pathology of labs, endoscopy, or imaging to date. I defer to his primary care if they wish to perform any brain imaging. Otherwise continue anti-emetics at this time.  He can follow up with me in one year.  [place] Cellar, MD [facility]

## 2018-04-13 ENCOUNTER — Ambulatory Visit (HOSPITAL_COMMUNITY): Payer: BC Managed Care – PPO

## 2018-04-26 ENCOUNTER — Ambulatory Visit
Admission: RE | Admit: 2018-04-26 | Discharge: 2018-04-26 | Disposition: A | Discharge status: Home / Self Care | Payer: BC Managed Care – PPO | Source: Ambulatory Visit | Attending: Gastroenterology | Admitting: Gastroenterology

## 2018-04-26 DIAGNOSIS — K7689 Other specified diseases of liver: Secondary | ICD-10-CM

## 2018-04-26 MED ORDER — GADOXETATE DISODIUM 0.25 MMOL/ML IV SOLN
10.0000 mL | Freq: Once | INTRAVENOUS | Status: AC | PRN
  Administered 2018-04-26: 10 mL via INTRAVENOUS

## 2018-05-01 ENCOUNTER — Other Ambulatory Visit: Payer: Self-pay

## 2018-05-01 ENCOUNTER — Encounter (HOSPITAL_COMMUNITY): Payer: Self-pay

## 2018-05-01 ENCOUNTER — Ambulatory Visit (HOSPITAL_COMMUNITY)
Admission: EM | Admit: 2018-05-01 | Discharge: 2018-05-01 | Disposition: A | Discharge status: Home / Self Care | Payer: BC Managed Care – PPO | Attending: Urgent Care | Admitting: Urgent Care

## 2018-05-01 DIAGNOSIS — W57XXXA Bitten or stung by nonvenomous insect and other nonvenomous arthropods, initial encounter: Secondary | ICD-10-CM | POA: Diagnosis not present

## 2018-05-01 DIAGNOSIS — L500 Allergic urticaria: Secondary | ICD-10-CM

## 2018-05-01 DIAGNOSIS — S50861A Insect bite (nonvenomous) of right forearm, initial encounter: Secondary | ICD-10-CM

## 2018-05-01 DIAGNOSIS — M7989 Other specified soft tissue disorders: Secondary | ICD-10-CM

## 2018-05-01 DIAGNOSIS — L299 Pruritus, unspecified: Secondary | ICD-10-CM

## 2018-05-01 MED ORDER — CETIRIZINE HCL 10 MG PO TABS: 10.0000 mg | ORAL_TABLET | Freq: Every day | ORAL | 0 refills | Status: DC

## 2018-05-01 MED ORDER — RANITIDINE HCL 150 MG PO CAPS: 150.0000 mg | ORAL_CAPSULE | Freq: Two times a day (BID) | ORAL | 0 refills | Status: DC

## 2018-05-01 MED ORDER — TRIAMCINOLONE ACETONIDE 0.1 % EX CREA: 1.0000 "application " | TOPICAL_CREAM | Freq: Two times a day (BID) | CUTANEOUS | 0 refills | Status: DC

## 2018-05-01 NOTE — ED Provider Notes (Signed)
  MRN: [id number] DOB: 12/23/1970  Subjective:   William Fuller is a 47 y.o. male presenting for 1 day history of right forearm swelling, warmth and redness.  Patient reports that he was working outside yesterday night and got stung by an unknown insect.  He subsequently developed his symptoms which included hives over his face torso and other arm.  Patient's wife gave him Benadryl with some improvement but his symptoms persist over his forearm today.  Denies fever, facial swelling, shortness of breath, chest tightness, nausea, vomiting, belly pain.  reports that he quit smoking about 2 years ago. He has never used smokeless tobacco. He reports that he has current or past drug history. Drug: Marijuana. He reports that he does not drink alcohol.    Current Facility-Administered Medications:  .  0.9 %  sodium chloride infusion, 500 mL, Intravenous, Continuous, Armbruster, Carlota Raspberry, MD  Current Outpatient Medications:  .  levocetirizine (XYZAL) 5 MG tablet, Take 1 tablet (5 mg total) by mouth every evening., Disp: 30 tablet, Rfl: 3 .  ondansetron (ZOFRAN ODT) 8 MG disintegrating tablet, Take 1 tablet (8 mg total) by mouth every 8 (eight) hours as needed for nausea or vomiting., Disp: 20 tablet, Rfl: 0 .  pantoprazole (PROTONIX) 40 MG tablet, Take 1 tablet (40 mg total) by mouth 2 (two) times daily., Disp: 120 tablet, Rfl: 1 .  ranitidine (ZANTAC) 150 MG capsule, Take 1 capsule (150 mg total) by mouth 2 (two) times daily., Disp: 60 capsule, Rfl: 0 .  traMADol (ULTRAM) 50 MG tablet, Take 1 tablet (50 mg total) by mouth every 8 (eight) hours as needed. (Patient not taking: Reported on 04/08/2018), Disp: 15 tablet, Rfl: 0   No Known Allergies  Past Medical History:  Diagnosis Date  . Allergy   . Focal nodular hyperplasia of liver   . GERD (gastroesophageal reflux disease)   . Hypertension   . Intestinal metaplasia of gastric mucosa      Past Surgical History:  Procedure Laterality Date  . HERNIA  REPAIR      Objective:   Vitals: BP 128/88 (BP Location: Left Arm)   Pulse 89   Temp 98.5 F (36.9 C) (Oral)   Resp 18   Wt 254 lb (115.2 kg)   SpO2 100%   BMI 36.45 kg/m   Physical Exam  Constitutional: He is oriented to person, place, and time. He appears well-developed and well-nourished.  HENT:  Mouth/Throat: Oropharynx is clear and moist.  Airways patent.  Cardiovascular: Normal rate, regular rhythm, normal heart sounds and intact distal pulses. Exam reveals no gallop and no friction rub.  No murmur heard. Pulmonary/Chest: Effort normal and breath sounds normal. No stridor. No respiratory distress. He has no wheezes. He has no rales.  Musculoskeletal:       Arms: Neurological: He is alert and oriented to person, place, and time.   Assessment and Plan :   Insect bite of right forearm, initial encounter  Forearm swelling  Allergic urticaria  Itching  She has a history of allergic urticaria with insect bites and stings.  He had significant improvement after 1 dose of Benadryl.  His physical exam findings are very reassuring today.  Counseled on conservative management which consist of either Benadryl or Zyrtec with Zantac.  ER return to clinic precautions reviewed.  Will use triamcinolone for local relief of itching and allergic dermatitis.    Jaynee Eagles, PA-C 05/01/18 1427

## 2018-05-01 NOTE — ED Triage Notes (Signed)
Pt states he was working on a car last night having to lay on the ground and something bit him right forearm and wrist.

## 2018-08-12 ENCOUNTER — Encounter: Payer: Self-pay | Admitting: Medical

## 2018-08-12 ENCOUNTER — Ambulatory Visit: Payer: BC Managed Care – PPO | Admitting: Medical

## 2018-08-12 VITALS — BP 140/90 | HR 64 | Temp 99.0°F | Resp 16 | Ht 70.0 in | Wt 260.0 lb

## 2018-08-12 DIAGNOSIS — R5383 Other fatigue: Secondary | ICD-10-CM

## 2018-08-12 DIAGNOSIS — F419 Anxiety disorder, unspecified: Secondary | ICD-10-CM

## 2018-08-12 DIAGNOSIS — F32A Depression, unspecified: Secondary | ICD-10-CM

## 2018-08-12 DIAGNOSIS — F329 Major depressive disorder, single episode, unspecified: Secondary | ICD-10-CM

## 2018-08-12 DIAGNOSIS — G4700 Insomnia, unspecified: Secondary | ICD-10-CM | POA: Diagnosis not present

## 2018-08-12 DIAGNOSIS — I10 Essential (primary) hypertension: Secondary | ICD-10-CM

## 2018-08-12 DIAGNOSIS — R51 Headache: Secondary | ICD-10-CM

## 2018-08-12 DIAGNOSIS — R519 Headache, unspecified: Secondary | ICD-10-CM

## 2018-08-12 LAB — VITAMIN B12: Vitamin B-12: 205 pg/mL — ABNORMAL LOW (ref 211–911)

## 2018-08-12 LAB — COMPREHENSIVE METABOLIC PANEL
ALBUMIN: 4.5 g/dL (ref 3.5–5.2)
ALK PHOS: 47 U/L (ref 39–117)
ALT: 25 U/L (ref 0–53)
AST: 24 U/L (ref 0–37)
BILIRUBIN TOTAL: 0.5 mg/dL (ref 0.2–1.2)
BUN: 11 mg/dL (ref 6–23)
CO2: 31 mEq/L (ref 19–32)
Calcium: 10.3 mg/dL (ref 8.4–10.5)
Chloride: 101 mEq/L (ref 96–112)
Creatinine, Ser: 1.18 mg/dL (ref 0.40–1.50)
GFR: 84.47 mL/min (ref >60.00)
GLUCOSE: 86 mg/dL (ref 70–99)
POTASSIUM: 5 meq/L (ref 3.5–5.1)
Sodium: 138 mEq/L (ref 135–145)
TOTAL PROTEIN: 7.5 g/dL (ref 6.0–8.3)

## 2018-08-12 LAB — CBC WITH DIFFERENTIAL/PLATELET
BASOS ABS: 0 10*3/uL (ref 0.0–0.1)
Basophils Relative: 0.6 % (ref 0.0–3.0)
EOS PCT: 0.8 % (ref 0.0–5.0)
Eosinophils Absolute: 0.1 10*3/uL (ref 0.0–0.7)
HEMATOCRIT: 45.6 % (ref 39.0–52.0)
HEMOGLOBIN: 15.3 g/dL (ref 13.0–17.0)
LYMPHS ABS: 2.2 10*3/uL (ref 0.7–4.0)
LYMPHS PCT: 35.3 % (ref 12.0–46.0)
MCHC: 33.6 g/dL (ref 30.0–36.0)
MCV: 90.3 fl (ref 78.0–100.0)
MONOS PCT: 8.3 % (ref 3.0–12.0)
Monocytes Absolute: 0.5 10*3/uL (ref 0.1–1.0)
NEUTROS PCT: 55 % (ref 43.0–77.0)
Neutro Abs: 3.5 10*3/uL (ref 1.4–7.7)
Platelets: 616 10*3/uL — ABNORMAL HIGH (ref 150.0–400.0)
RBC: 5.05 Mil/uL (ref 4.22–5.81)
RDW: 14.4 % (ref 11.5–15.5)
WBC: 6.3 10*3/uL (ref 4.0–10.5)

## 2018-08-12 LAB — TSH: TSH: 1.24 u[IU]/mL (ref 0.35–4.50)

## 2018-08-12 LAB — SEDIMENTATION RATE: SED RATE: 6 mm/h (ref 0–15)

## 2018-08-12 MED ORDER — VENLAFAXINE HCL ER 37.5 MG PO CP24: 37.5000 mg | ORAL_CAPSULE | Freq: Every day | ORAL | 0 refills | Status: DC

## 2018-08-12 MED ORDER — METOPROLOL SUCCINATE ER 25 MG PO TB24: 25.0000 mg | ORAL_TABLET | Freq: Every day | ORAL | 0 refills | Status: DC

## 2018-08-12 MED ORDER — ALPRAZOLAM 0.5 MG PO TABS: 0.5000 mg | ORAL_TABLET | Freq: Every evening | ORAL | 0 refills | Status: DC | PRN

## 2018-08-12 MED ORDER — SUMATRIPTAN SUCCINATE 50 MG PO TABS: 50.0000 mg | ORAL_TABLET | ORAL | 0 refills | Status: AC | PRN

## 2018-08-12 MED ORDER — SUMATRIPTAN SUCCINATE 50 MG PO TABS: 50.0000 mg | ORAL_TABLET | ORAL | 0 refills | Status: DC | PRN

## 2018-08-12 NOTE — Patient Instructions (Addendum)
You  did score high on depression questionnaire.  I think you will benefit from Effexor depression tablets.  Effexor can also help with anxiety.  We are starting you on a low dose and can incrementally increase in future if needed.  For severe insomnia and some low-level anxiety, I am prescribing Xanax tablets to use 1 tablet at night for the next a week or so.  We will see if you get some good sleep and if this affects/decreases the headaches.  On follow-up if not doing well we could give you information on counseling services.  If you have any serious thoughts/plans of harm to self or others then recommend [facility], [facility].  You have history of headaches in the past, some features seem to be migraine-like.  I am going to give you Imitrex tablets to use at early onset of headache.  If headache does not resolve then notify us.  If any headaches with neurologic signs or symptoms as discussed then recommend ED evaluation.  Your blood pressure is borderline elevated today and has been in the past.  I do think is a good idea to give you low-dose metoprolol.  This can drop your blood pressure and sometimes can prevent migraine type headaches.  You have some fatigue and will get CBC, CMP, TSH, B12, B1 and vitamin D level.  Also for your recent headaches we will get sed rate.  Follow-up in 10 days or as needed.

## 2018-08-12 NOTE — Progress Notes (Signed)
Subjective:    Patient ID: William Fuller, male    DOB: 07/09/1971, 48 y.o.   MRN: [id number]  HPI Pt in for ha but also on screen depression scores very high.  He states depressed since approximate early summer.. He scored 25 if PHQ-9. Pt states he has some issues with daughter. His daughter is with gang member. Other daughter relations are poor. He is not getting along with corworkers.  History of mild depression in past. But not like this. He cries a lot recently. Pt states does not sleep well at all. Pt state appetite decreased but gaining weight. Eats one full meal. He plans to start working out. Some fatigue but not sleeping.  Pt states he has got recent ha. 6-7 months. States daily ha. Does not take any meds for ha. He states if he were to sleep well will still get ha. He wakes up with ha at times. Some sound sensitive ha. But no light sensitive. He used to have history of ha in past. Worse in 2007. In past ha at time severe that caused him to have to go in dark room to sleep ha off.   Review of Systems  Hematological: Negative for adenopathy. Does not bruise/bleed easily.  Psychiatric/Behavioral: Positive for dysphoric mood and sleep disturbance. Negative for behavioral problems and confusion. The patient is not nervous/anxious.        Transient thought of better of dead. Start one second or so random. Though go away quickly. No plans. No hx of suicide attempt. No homicide ideation.    Past Medical History:  Diagnosis Date  . Allergy   . Focal nodular hyperplasia of liver   . GERD (gastroesophageal reflux disease)   . Hypertension   . Intestinal metaplasia of gastric mucosa      Social History   Socioeconomic History  . Marital status: Single    Spouse name: Not on file  . Number of children: Not on file  . Years of education: Not on file  . Highest education level: Not on file  Occupational History  . Not on file  Social Needs  . Financial resource strain: Not on  file  . Food insecurity:    Worry: Not on file    Inability: Not on file  . Transportation needs:    Medical: Not on file    Non-medical: Not on file  Tobacco Use  . Smoking status: Former Smoker    Last attempt to quit: 06/12/2015    Years since quitting: 3.1  . Smokeless tobacco: Never Used  Substance and Sexual Activity  . Alcohol use: No    Alcohol/week: 0.0 standard drinks  . Drug use: Yes    Types: Marijuana    Comment: pt smokes twice a week. Each time 2 joints.  . Sexual activity: Yes  Lifestyle  . Physical activity:    Days per week: Not on file    Minutes per session: Not on file  . Stress: Not on file  Relationships  . Social connections:    Talks on phone: Not on file    Gets together: Not on file    Attends religious service: Not on file    Active member of club or organization: Not on file    Attends meetings of clubs or organizations: Not on file    Relationship status: Not on file  . Intimate partner violence:    Fear of current or ex partner: Not on file    Emotionally abused:  Not on file    Physically abused: Not on file    Forced sexual activity: Not on file  Other Topics Concern  . Not on file  Social History Narrative  . Not on file    Past Surgical History:  Procedure Laterality Date  . HERNIA REPAIR      Family History  Problem Relation Age of Onset  . Hypertension Other   . CAD Other   . Breast cancer Mother   . Heart disease Mother   . Heart disease Brother   . Heart disease Maternal Grandmother   . Hypertension Maternal Grandmother   . Colon cancer Neg Hx     No Known Allergies  Current Outpatient Medications on File Prior to Visit  Medication Sig Dispense Refill  . cetirizine (ZYRTEC ALLERGY) 10 MG tablet Take 1 tablet (10 mg total) by mouth daily. 30 tablet 0  . levocetirizine (XYZAL) 5 MG tablet Take 1 tablet (5 mg total) by mouth every evening. 30 tablet 3  . ondansetron (ZOFRAN ODT) 8 MG disintegrating tablet Take 1  tablet (8 mg total) by mouth every 8 (eight) hours as needed for nausea or vomiting. 20 tablet 0  . pantoprazole (PROTONIX) 40 MG tablet Take 1 tablet (40 mg total) by mouth 2 (two) times daily. 120 tablet 1  . ranitidine (ZANTAC) 150 MG capsule Take 1 capsule (150 mg total) by mouth 2 (two) times daily. 60 capsule 0  . traMADol (ULTRAM) 50 MG tablet Take 1 tablet (50 mg total) by mouth every 8 (eight) hours as needed. 15 tablet 0  . triamcinolone cream (KENALOG) 0.1 % Apply 1 application topically 2 (two) times daily. 30 g 0   Current Facility-Administered Medications on File Prior to Visit  Medication Dose Route Frequency Provider Last Rate Last Dose  . 0.9 %  sodium chloride infusion  500 mL Intravenous Continuous Armbruster, Carlota Raspberry, MD        BP (!) 142/95   Pulse 64   Temp 99 F (37.2 C) (Oral)   Resp 16   Ht 5\' 10"  (1.778 m)   Wt 260 lb (117.9 kg)   SpO2 98%   BMI 37.31 kg/m       Objective:   Physical Exam  General Mental Status- Alert. General Appearance- Not in acute distress.   Skin General: Color- Normal Color. Moisture- Normal Moisture.  Neck Carotid Arteries- Normal color. Moisture- Normal Moisture. No carotid bruits. No JVD.  Chest and Lung Exam Auscultation: Breath Sounds:-Normal.  Cardiovascular Auscultation:Rythm- Regular. Murmurs & Other Heart Sounds:Auscultation of the heart reveals- No Murmurs.  Abdomen Inspection:-Inspeection Normal. Palpation/Percussion:Note:No mass. Palpation and Percussion of the abdomen reveal- Non Tender, Non Distended + BS, no rebound or guarding.  Neurologic Cranial Nerve exam:- CN III-XII intact(No nystagmus), symmetric smile. Heal to Toe Gait exam:-Normal. Finger to Nose:- Normal/Intact Strength:- 5/5 equal and symmetric strength both upper and lower extremities.      Assessment & Plan:  You  did score high on depression questionnaire.  I think you will benefit from Effexor depression tablets.  Effexor can also  help with anxiety.  We are starting you on a low dose and can incrementally increase in future if needed.  For severe insomnia and some low-level anxiety, I am prescribing Xanax tablets to use 1 tablet at night for the next a week or so.  We will see if you get some good sleep and if this affects/decreases the headaches.  On follow-up if not doing well  we could give you information on counseling services.  If you have any serious thoughts/plans of harm to self or others then recommend [facility].  You have history of headaches in the past, some features seem to be migraine-like.  I am going to give you Imitrex tablets to use at early onset of headache.  If headache does not resolve then notify us.  If any headaches with neurologic signs or symptoms as discussed then recommend ED evaluation.  Your blood pressure is borderline elevated today and has been in the past.  I do think is a good idea to give you low-dose metoprolol.  This can drop your blood pressure and sometimes can prevent migraine type headaches.  You have some fatigue and will get CBC, CMP, TSH, B12, B1 and vitamin D level.  Also for your recent headaches we will get sed rate.  Follow-up in 10 days or as needed.  [name], PA-C

## 2018-08-13 ENCOUNTER — Telehealth: Payer: Self-pay | Admitting: Medical

## 2018-08-13 DIAGNOSIS — D473 Essential (hemorrhagic) thrombocythemia: Secondary | ICD-10-CM

## 2018-08-13 DIAGNOSIS — D75839 Thrombocytosis, unspecified: Secondary | ICD-10-CM

## 2018-08-13 NOTE — Telephone Encounter (Signed)
Referral to hematologist placed. °

## 2018-08-16 ENCOUNTER — Ambulatory Visit: Payer: Self-pay

## 2018-08-16 LAB — VITAMIN B1: VITAMIN B1 (THIAMINE): 8 nmol/L (ref 8–30)

## 2018-08-16 LAB — VITAMIN D 1,25 DIHYDROXY

## 2018-08-16 NOTE — Telephone Encounter (Signed)
Returned patient call who states that yesterday he took his first dose of metoprolol and about one hour later started to break out in a rash on his neck back and both arms. He describes the rash as small even bumps not hives.  He states the the itching was severe yesterday but today it has subsided He rates the itching at 5 today. He states it is red. He has not taken his dose of metoprolol today and will not until directed by his physician. Pt has no other symptoms no swelling of his tongue. Appointment scheduled per protocol. Pt will monitor his BP and call back if it elevates above 160/90. Care advice read to patient. Pt verbalized understanding of all instructions. Reason for Disposition . Hives or itching  Answer Assessment - Initial Assessment Questions 1. APPEARANCE of RASH: "Describe the rash." (e.g., spots, blisters, raised areas, skin peeling, scaly)     Raised areas 2. SIZE: "How big are the spots?" (e.g., tip of pen, eraser, coin; inches, centimeters)     Tip of pen or bigger 3. LOCATION: "Where is the rash located?"     Neck back and both arms 4. COLOR: "What color is the rash?" (Note: It is difficult to assess rash color in people with darker-colored skin. When this situation occurs, simply ask the caller to describe what they see.)     red 5. ONSET: "When did the rash begin?"     1 hour after taking metoprolol 6. FEVER: "Do you have a fever?" If so, ask: "What is your temperature, how was it measured, and when did it start?"     no 7. ITCHING: "Does the rash itch?" If so, ask: "How bad is the itch?" (Scale 1-10; or mild, moderate, severe)     5 8. CAUSE: "What do you think is causing the rash?"     medications 9. NEW MEDICATION: "What new medication are you taking?" (e.g., name of antibiotic) "When did you start taking this medication?".     metoprolol 10. OTHER SYMPTOMS: "Do you have any other symptoms?" (e.g., sore throat, fever, joint pain)       no 11. PREGNANCY: "Is there  any chance you are pregnant?" "When was your last menstrual period?"    N/A  Protocols used: RASH - WIDESPREAD ON DRUGS-A-AH

## 2018-08-17 ENCOUNTER — Encounter: Payer: Self-pay | Admitting: Medical

## 2018-08-17 ENCOUNTER — Telehealth: Payer: Self-pay | Admitting: Hematology & Oncology

## 2018-08-17 ENCOUNTER — Ambulatory Visit: Payer: BC Managed Care – PPO | Admitting: Medical

## 2018-08-17 VITALS — BP 127/77 | HR 63 | Temp 98.8°F | Resp 16 | Ht 70.0 in | Wt 265.0 lb

## 2018-08-17 DIAGNOSIS — T7840XA Allergy, unspecified, initial encounter: Secondary | ICD-10-CM

## 2018-08-17 DIAGNOSIS — E538 Deficiency of other specified B group vitamins: Secondary | ICD-10-CM | POA: Diagnosis not present

## 2018-08-17 DIAGNOSIS — F32A Depression, unspecified: Secondary | ICD-10-CM

## 2018-08-17 DIAGNOSIS — R21 Rash and other nonspecific skin eruption: Secondary | ICD-10-CM | POA: Diagnosis not present

## 2018-08-17 DIAGNOSIS — L739 Follicular disorder, unspecified: Secondary | ICD-10-CM

## 2018-08-17 DIAGNOSIS — F329 Major depressive disorder, single episode, unspecified: Secondary | ICD-10-CM

## 2018-08-17 MED ORDER — CEPHALEXIN 500 MG PO CAPS: 500.0000 mg | ORAL_CAPSULE | Freq: Two times a day (BID) | ORAL | 0 refills | Status: DC

## 2018-08-17 MED ORDER — SERTRALINE HCL 25 MG PO TABS: 25.0000 mg | ORAL_TABLET | Freq: Every day | ORAL | 0 refills | Status: DC

## 2018-08-17 MED ORDER — CYANOCOBALAMIN 1000 MCG/ML IJ SOLN
1000.0000 ug | Freq: Once | INTRAMUSCULAR | Status: AC
  Administered 2018-08-17: 1000 ug via INTRAMUSCULAR

## 2018-08-17 MED ORDER — HYDROXYZINE HCL 25 MG PO TABS: ORAL_TABLET | ORAL | 0 refills | Status: DC

## 2018-08-17 MED ORDER — CYANOCOBALAMIN 1000 MCG/ML IJ SOLN: 1000.0000 ug | Freq: Once | INTRAMUSCULAR | 0 refills | Status: DC

## 2018-08-17 MED ORDER — LOSARTAN POTASSIUM 25 MG PO TABS: 25.0000 mg | ORAL_TABLET | Freq: Every day | ORAL | 0 refills | Status: DC

## 2018-08-17 MED ORDER — PREDNISONE 10 MG PO TABS: ORAL_TABLET | ORAL | 0 refills | Status: DC

## 2018-08-17 NOTE — Progress Notes (Signed)
Subjective:    Patient ID: William Fuller, male    DOB: 08-Jul-1971, 48 y.o.   MRN: [id number]  HPI  Pt in for follow up due to rash. He has small bumps on his upper back, medial arm and forearms. He states Tuesday morning he felt mild itching and almost like bite sensation to his arms. No insect seen.  No red rash seen, no sob or wheezing. No lips or tongue swelling.  Pt that morning started effexor and first time he took metoprolol. Above signs and symptoms occurred about one hour later.   Pt has no change detergents, soaps, creams, lotions, or outdoor exposure. No new pets. No hx of sensitive skin.     Review of Systems  Constitutional: Negative for chills, fatigue, fever and unexpected weight change.  Gastrointestinal: Negative for abdominal pain.  Genitourinary: Negative for dysuria and enuresis.  Musculoskeletal: Negative for back pain.  Skin: Positive for rash.       Itching.  Neurological: Negative for dizziness and headaches.  Psychiatric/Behavioral: Positive for dysphoric mood. Negative for suicidal ideas. The patient is nervous/anxious.     Past Medical History:  Diagnosis Date  . Allergy   . Focal nodular hyperplasia of liver   . GERD (gastroesophageal reflux disease)   . Hypertension   . Intestinal metaplasia of gastric mucosa      Social History   Socioeconomic History  . Marital status: Single    Spouse name: Not on file  . Number of children: Not on file  . Years of education: Not on file  . Highest education level: Not on file  Occupational History  . Not on file  Social Needs  . Financial resource strain: Not on file  . Food insecurity:    Worry: Not on file    Inability: Not on file  . Transportation needs:    Medical: Not on file    Non-medical: Not on file  Tobacco Use  . Smoking status: Former Smoker    Last attempt to quit: 06/12/2015    Years since quitting: 3.1  . Smokeless tobacco: Never Used  Substance and Sexual Activity  .  Alcohol use: No    Alcohol/week: 0.0 standard drinks  . Drug use: Yes    Types: Marijuana    Comment: pt smokes twice a week. Each time 2 joints.  . Sexual activity: Yes  Lifestyle  . Physical activity:    Days per week: Not on file    Minutes per session: Not on file  . Stress: Not on file  Relationships  . Social connections:    Talks on phone: Not on file    Gets together: Not on file    Attends religious service: Not on file    Active member of club or organization: Not on file    Attends meetings of clubs or organizations: Not on file    Relationship status: Not on file  . Intimate partner violence:    Fear of current or ex partner: Not on file    Emotionally abused: Not on file    Physically abused: Not on file    Forced sexual activity: Not on file  Other Topics Concern  . Not on file  Social History Narrative  . Not on file    Past Surgical History:  Procedure Laterality Date  . HERNIA REPAIR      Family History  Problem Relation Age of Onset  . Hypertension Other   . CAD Other   .  Breast cancer Mother   . Heart disease Mother   . Heart disease Brother   . Heart disease Maternal Grandmother   . Hypertension Maternal Grandmother   . Colon cancer Neg Hx     No Known Allergies  Current Outpatient Medications on File Prior to Visit  Medication Sig Dispense Refill  . ALPRAZolam (XANAX) 0.5 MG tablet Take 1 tablet (0.5 mg total) by mouth at bedtime as needed for anxiety. 7 tablet 0  . cetirizine (ZYRTEC ALLERGY) 10 MG tablet Take 1 tablet (10 mg total) by mouth daily. 30 tablet 0  . levocetirizine (XYZAL) 5 MG tablet Take 1 tablet (5 mg total) by mouth every evening. 30 tablet 3  . ondansetron (ZOFRAN ODT) 8 MG disintegrating tablet Take 1 tablet (8 mg total) by mouth every 8 (eight) hours as needed for nausea or vomiting. 20 tablet 0  . pantoprazole (PROTONIX) 40 MG tablet Take 1 tablet (40 mg total) by mouth 2 (two) times daily. 120 tablet 1  . ranitidine  (ZANTAC) 150 MG capsule Take 1 capsule (150 mg total) by mouth 2 (two) times daily. 60 capsule 0  . SUMAtriptan (IMITREX) 50 MG tablet Take 1 tablet (50 mg total) by mouth every 2 (two) hours as needed for migraine. May repeat in 2 hours if headache persists or recurs.(max 2 tab in 24 hour period) 10 tablet 0  . triamcinolone cream (KENALOG) 0.1 % Apply 1 application topically 2 (two) times daily. 30 g 0   Current Facility-Administered Medications on File Prior to Visit  Medication Dose Route Frequency Provider Last Rate Last Dose  . 0.9 %  sodium chloride infusion  500 mL Intravenous Continuous Armbruster, Carlota Raspberry, MD        BP 127/77   Pulse 63   Temp 98.8 F (37.1 C) (Oral)   Resp 16   Ht 5\' 10"  (1.778 m)   Wt 265 lb (120.2 kg)   SpO2 99%   BMI 38.02 kg/m       Objective:   Physical Exam  General- No acute distress. Pleasant patient. EENT-  No lip swelling. Normal buccal mucosa. Neck- Full range of motion, no jvd Lungs- Clear, even and unlabored. Heart- regular rate and rhythm. Neurologic- CNII- XII grossly intact.  Skin- scattered appearance of inflamed follicles. Rt medial bicep, forearms and back of neck.      Assessment & Plan:  You appear to have a possible allergic reaction versus folliculitis.  The timing of your signs/symptoms coincided with use of Effexor and metoprolol for the first time.  These are typically not medication that cause allergic type reaction but for caution sake will give sertaline and low dose losartan in place of.  For allergic reaction will give 5 day taper prednisone. For itching making hydroxyzine available to use at night if needed for itching.  Some features of possible folliculitis so will prescribe 7 days of keflex antibiotic.  Follow up in 14  days or as needed  [employer], [employer]

## 2018-08-17 NOTE — Patient Instructions (Addendum)
You appear to have a possible allergic reaction versus folliculitis.  The timing of your signs/symptoms coincided with use of Effexor and metoprolol for the first time.  These are typically not medication medications that cause allergic type reaction but for caution sake will give sertaline and low dose losartan in place of.  For allergic reaction will give 5 day taper prednisone. For itching making hydroxyzine available to use at night if needed for itching.  Some features of possible folliculitis so will prescribe 7 days of keflex antibiotic.  Follow up in 14  days or as needed

## 2018-08-17 NOTE — Telephone Encounter (Signed)
Called patient LMVM with date/time of appointments.  Letter/calendar mailed

## 2018-08-18 ENCOUNTER — Other Ambulatory Visit: Payer: BC Managed Care – PPO

## 2018-08-18 ENCOUNTER — Other Ambulatory Visit: Payer: Self-pay | Admitting: Emergency Medicine

## 2018-08-18 DIAGNOSIS — R5383 Other fatigue: Secondary | ICD-10-CM

## 2018-08-22 ENCOUNTER — Ambulatory Visit: Payer: BC Managed Care – PPO | Admitting: Medical

## 2018-08-22 DIAGNOSIS — Z0289 Encounter for other administrative examinations: Secondary | ICD-10-CM

## 2018-08-22 LAB — VITAMIN D 1,25 DIHYDROXY
VITAMIN D 1, 25 (OH) TOTAL: 72 pg/mL (ref 18–72)
Vitamin D3 1, 25 (OH)2: 72 pg/mL

## 2018-09-01 ENCOUNTER — Inpatient Hospital Stay: Payer: BC Managed Care – PPO | Attending: Hematology & Oncology

## 2018-09-01 ENCOUNTER — Inpatient Hospital Stay: Payer: BC Managed Care – PPO | Admitting: Hematology & Oncology

## 2018-09-27 ENCOUNTER — Other Ambulatory Visit: Payer: Self-pay

## 2018-09-27 ENCOUNTER — Ambulatory Visit (HOSPITAL_COMMUNITY)
Admission: EM | Admit: 2018-09-27 | Discharge: 2018-09-27 | Disposition: A | Discharge status: Home / Self Care | Payer: BC Managed Care – PPO | Attending: Emergency Medicine | Admitting: Emergency Medicine

## 2018-09-27 ENCOUNTER — Encounter (HOSPITAL_COMMUNITY): Payer: Self-pay | Admitting: Emergency Medicine

## 2018-09-27 DIAGNOSIS — J111 Influenza due to unidentified influenza virus with other respiratory manifestations: Secondary | ICD-10-CM

## 2018-09-27 MED ORDER — IBUPROFEN 600 MG PO TABS: 600.0000 mg | ORAL_TABLET | Freq: Four times a day (QID) | ORAL | 0 refills | Status: DC | PRN

## 2018-09-27 MED ORDER — ONDANSETRON 8 MG PO TBDP: ORAL_TABLET | ORAL | 0 refills | Status: DC

## 2018-09-27 MED ORDER — FLUTICASONE PROPIONATE 50 MCG/ACT NA SUSP: 2.0000 | Freq: Every day | NASAL | 0 refills | Status: DC

## 2018-09-27 MED ORDER — HYDROCOD POLST-CPM POLST ER 10-8 MG/5ML PO SUER: 5.0000 mL | Freq: Two times a day (BID) | ORAL | 0 refills | Status: DC | PRN

## 2018-09-27 MED ORDER — OSELTAMIVIR PHOSPHATE 75 MG PO CAPS: 75.0000 mg | ORAL_CAPSULE | Freq: Two times a day (BID) | ORAL | 0 refills | Status: DC

## 2018-09-27 NOTE — ED Provider Notes (Signed)
HPI  SUBJECTIVE:  William Fuller is a 48 y.o. male who presents with acute onset of body aches, headaches, states that he has felt feverish but has not taken his temperature at home.  Some sinus pain and pressure.  He reports nausea and 2 episodes of vomiting last night.  He is tolerating p.o. today.  Reports nonproductive cough,  constant achy, chest pain.  States that he gets this chest pain whenever he gets sick-states that it is a GERD flare.  Chest pain symptoms are worse with coughing, better with belching.  No exertional component.  No associated nausea or diaphoresis, chest pressure.  No nasal congestion, ear pain, rhinorrhea, postnasal drip.  No neck stiffness, photophobia.  No rash, sore throat.  No wheezing.  No abdominal pain.  No diarrhea.  He did not get a flu shot this year.  No known contacts with flu.  He tried Mucinex without improvement in his symptoms.  No aggravating factors.  He has a past medical history of hypertension, GERD, he is a former smoker.  No history of diabetes, MI, hypercholesterolemia.  No history of frequent sinusitis.  He states that he is unable to sleep at night because of the cough.  No antipyretic in the past 4 to 6 hours.  He tried Mucinex without improvement in symptoms.  No aggravating factors.  He states that he has never felt like this before.  He has not been on the Keflex in over a week-states he did not finish his prescription.  ZOX:WRUEAVW, Iris Pert     Past Medical History:  Diagnosis Date  . Allergy   . Focal nodular hyperplasia of liver   . GERD (gastroesophageal reflux disease)   . Hypertension   . Intestinal metaplasia of gastric mucosa     Past Surgical History:  Procedure Laterality Date  . HERNIA REPAIR      Family History  Problem Relation Age of Onset  . Hypertension Other   . CAD Other   . Breast cancer Mother   . Heart disease Mother   . Heart disease Brother   . Heart disease Maternal Grandmother   . Hypertension  Maternal Grandmother   . Colon cancer Neg Hx     Social History   Tobacco Use  . Smoking status: Former Smoker    Last attempt to quit: 06/12/2015    Years since quitting: 3.2  . Smokeless tobacco: Never Used  Substance Use Topics  . Alcohol use: No    Alcohol/week: 0.0 standard drinks  . Drug use: Yes    Types: Marijuana    Comment: pt smokes twice a week. Each time 2 joints.     Current Facility-Administered Medications:  .  0.9 %  sodium chloride infusion, 500 mL, Intravenous, Continuous, Armbruster, Carlota Raspberry, MD  Current Outpatient Medications:  .  chlorpheniramine-HYDROcodone (TUSSIONEX PENNKINETIC ER) 10-8 MG/5ML SUER, Take 5 mLs by mouth every 12 (twelve) hours as needed for cough., Disp: 60 mL, Rfl: 0 .  fluticasone (FLONASE) 50 MCG/ACT nasal spray, Place 2 sprays into both nostrils daily., Disp: 16 g, Rfl: 0 .  ibuprofen (ADVIL,MOTRIN) 600 MG tablet, Take 1 tablet (600 mg total) by mouth every 6 (six) hours as needed., Disp: 30 tablet, Rfl: 0 .  losartan (COZAAR) 25 MG tablet, Take 1 tablet (25 mg total) by mouth daily., Disp: 30 tablet, Rfl: 0 .  ondansetron (ZOFRAN ODT) 8 MG disintegrating tablet, 1/2- 1 tablet q 8 hr prn nausea, vomiting, Disp: 20 tablet, Rfl:  0 .  oseltamivir (TAMIFLU) 75 MG capsule, Take 1 capsule (75 mg total) by mouth 2 (two) times daily. X 5 days, Disp: 10 capsule, Rfl: 0 .  pantoprazole (PROTONIX) 40 MG tablet, Take 1 tablet (40 mg total) by mouth 2 (two) times daily., Disp: 120 tablet, Rfl: 1 .  ranitidine (ZANTAC) 150 MG capsule, Take 1 capsule (150 mg total) by mouth 2 (two) times daily., Disp: 60 capsule, Rfl: 0 .  sertraline (ZOLOFT) 25 MG tablet, Take 1 tablet (25 mg total) by mouth daily., Disp: 14 tablet, Rfl: 0 .  SUMAtriptan (IMITREX) 50 MG tablet, Take 1 tablet (50 mg total) by mouth every 2 (two) hours as needed for migraine. May repeat in 2 hours if headache persists or recurs.(max 2 tab in 24 hour period), Disp: 10 tablet, Rfl: 0 .   triamcinolone cream (KENALOG) 0.1 %, Apply 1 application topically 2 (two) times daily., Disp: 30 g, Rfl: 0  No Known Allergies   ROS  As noted in HPI.   Physical Exam  BP (!) 150/81 (BP Location: Left Arm)   Pulse (!) 101   Temp 99.7 F (37.6 C) (Oral)   Resp 18   SpO2 97%   Constitutional: Well developed, well nourished, no acute distress Eyes: PERRL, EOMI, conjunctiva normal bilaterally HENT: Normocephalic, atraumatic,mucus membranes moist.  Clear nasal congestion.  Normal turbinates.  Positive maxillary and frontal sinus tenderness.  Normal tonsils.  Uvula midline.  Positive cobblestoning.  No obvious postnasal drip. Neck: No cervical lymphadenopathy, meningismus. Respiratory: Clear to auscultation bilaterally, no rales, no wheezing, no rhonchi Cardiovascular: Normal rate and rhythm, no murmurs, no gallops, no rubs GI: Soft, nondistended, normal bowel sounds, nontender, no rebound, no guarding Back: no CVAT skin: No rash, skin intact Musculoskeletal: No edema, no tenderness, no deformities Neurologic: Alert & oriented x 3, CN II-XII grossly intact, no motor deficits, sensation grossly intact Psychiatric: Speech and behavior appropriate   ED Course   Medications - No data to display  No orders of the defined types were placed in this encounter.  No results found for this or any previous visit (from the past 24 hour(s)). No results found.  ED Clinical Impression  Influenza   ED Assessment/Plan   [place] Narcotic database reviewed for this patient, and feel that the risk/benefit ratio today is favorable for proceeding with a prescription for controlled substance.  Recently filled a prescription for Xanax.  1 opiate prescriptions in the past 2 years.  Advised patient to not take Xanax and Tussionex.  Patient appears nontoxic.  While he does have maxillary and frontal sinus tenderness, will treat as influenza with Tamiflu, Tylenol/ibuprofen, Tussionex, Zofran, Flonase,  saline nasal irrigation.  Follow-up with his primary care physician in 6 days if not feeling better, to the ER if he gets worse.  Discussed MDM, treatment plan, and plan for follow-up with patient Discussed sn/sx that should prompt return to the ED. patient agrees with plan.   Meds ordered this encounter  Medications  . chlorpheniramine-HYDROcodone (TUSSIONEX PENNKINETIC ER) 10-8 MG/5ML SUER    Sig: Take 5 mLs by mouth every 12 (twelve) hours as needed for cough.    Dispense:  60 mL    Refill:  0  . oseltamivir (TAMIFLU) 75 MG capsule    Sig: Take 1 capsule (75 mg total) by mouth 2 (two) times daily. X 5 days    Dispense:  10 capsule    Refill:  0  . ibuprofen (ADVIL,MOTRIN) 600 MG tablet  Sig: Take 1 tablet (600 mg total) by mouth every 6 (six) hours as needed.    Dispense:  30 tablet    Refill:  0  . ondansetron (ZOFRAN ODT) 8 MG disintegrating tablet    Sig: 1/2- 1 tablet q 8 hr prn nausea, vomiting    Dispense:  20 tablet    Refill:  0  . fluticasone (FLONASE) 50 MCG/ACT nasal spray    Sig: Place 2 sprays into both nostrils daily.    Dispense:  16 g    Refill:  0    *This clinic note was created using [employer]. Therefore, there may be occasional mistakes despite careful proofreading.  ?    Melynda Ripple, MD 09/27/18 1106

## 2018-09-27 NOTE — Discharge Instructions (Addendum)
Finish the Tamiflu, even if you feel better.  Push plenty of electrolyte containing fluids such as Pedialyte and Gatorade.  Zofran as needed for nausea.  Do not take Xanax if you are taking the Tussionex because the combination can make you stop breathing.  Stop the antihistamines and try some Mucinex for nasal congestion.  Try some Flonase.  Saline nasal irrigation with a Milta Deiters med rinse and distilled water as often as you want to help prevent a bacterial sinus infection.  Take 600 mg ibuprofen combined with 1 g of Tylenol 3-4 times a day as needed for pain, headache.

## 2018-09-27 NOTE — ED Triage Notes (Signed)
Onset of symptoms yesterday.  Cough, non-productive, general aches, chills, unknown fever.

## 2018-09-29 ENCOUNTER — Telehealth: Payer: Self-pay | Admitting: Medical

## 2018-09-29 NOTE — Telephone Encounter (Signed)
Will you call patient and let him know Dr. Marin Olp office notified that he did not go to appointment for high platelets. Advise I recommend he go to that. If he agrees will put in referral again.

## 2018-09-30 NOTE — Telephone Encounter (Signed)
Called pt. Pt was been sick with flu will follow up Monday.

## 2018-10-03 ENCOUNTER — Encounter: Payer: Self-pay | Admitting: Medical

## 2018-10-03 ENCOUNTER — Ambulatory Visit (HOSPITAL_BASED_OUTPATIENT_CLINIC_OR_DEPARTMENT_OTHER)
Admission: RE | Admit: 2018-10-03 | Discharge: 2018-10-03 | Disposition: A | Discharge status: Home / Self Care | Payer: BC Managed Care – PPO | Source: Ambulatory Visit | Attending: Medical | Admitting: Medical

## 2018-10-03 ENCOUNTER — Ambulatory Visit: Payer: BC Managed Care – PPO | Admitting: Medical

## 2018-10-03 VITALS — BP 140/80 | HR 101 | Temp 98.8°F | Resp 16 | Ht 70.0 in | Wt 254.2 lb

## 2018-10-03 DIAGNOSIS — R05 Cough: Secondary | ICD-10-CM | POA: Insufficient documentation

## 2018-10-03 DIAGNOSIS — R519 Headache, unspecified: Secondary | ICD-10-CM

## 2018-10-03 DIAGNOSIS — J40 Bronchitis, not specified as acute or chronic: Secondary | ICD-10-CM | POA: Diagnosis present

## 2018-10-03 DIAGNOSIS — M5442 Lumbago with sciatica, left side: Secondary | ICD-10-CM | POA: Diagnosis not present

## 2018-10-03 DIAGNOSIS — R059 Cough, unspecified: Secondary | ICD-10-CM

## 2018-10-03 DIAGNOSIS — R51 Headache: Secondary | ICD-10-CM | POA: Diagnosis not present

## 2018-10-03 LAB — CBC WITH DIFFERENTIAL/PLATELET
BASOS ABS: 0 10*3/uL (ref 0.0–0.1)
Basophils Relative: 0.6 % (ref 0.0–3.0)
EOS ABS: 0 10*3/uL (ref 0.0–0.7)
EOS PCT: 0.6 % (ref 0.0–5.0)
HCT: 46.1 % (ref 39.0–52.0)
HEMOGLOBIN: 15.4 g/dL (ref 13.0–17.0)
LYMPHS ABS: 2 10*3/uL (ref 0.7–4.0)
Lymphocytes Relative: 43.9 % (ref 12.0–46.0)
MCHC: 33.4 g/dL (ref 30.0–36.0)
MCV: 90.9 fl (ref 78.0–100.0)
MONO ABS: 0.5 10*3/uL (ref 0.1–1.0)
Monocytes Relative: 10.7 % (ref 3.0–12.0)
NEUTROS PCT: 44.2 % (ref 43.0–77.0)
Neutro Abs: 2 10*3/uL (ref 1.4–7.7)
Platelets: 536 10*3/uL — ABNORMAL HIGH (ref 150.0–400.0)
RBC: 5.08 Mil/uL (ref 4.22–5.81)
RDW: 13.6 % (ref 11.5–15.5)
WBC: 4.6 10*3/uL (ref 4.0–10.5)

## 2018-10-03 LAB — SEDIMENTATION RATE: SED RATE: 13 mm/h (ref 0–15)

## 2018-10-03 MED ORDER — CYCLOBENZAPRINE HCL 10 MG PO TABS: 10.0000 mg | ORAL_TABLET | Freq: Every day | ORAL | 0 refills | Status: DC

## 2018-10-03 MED ORDER — AZITHROMYCIN 250 MG PO TABS: ORAL_TABLET | ORAL | 0 refills | Status: DC

## 2018-10-03 MED ORDER — KETOROLAC TROMETHAMINE 30 MG/ML IJ SOLN
30.0000 mg | Freq: Once | INTRAMUSCULAR | Status: AC
  Administered 2018-10-03: 30 mg via INTRAMUSCULAR

## 2018-10-03 NOTE — Progress Notes (Signed)
Subjective:    Patient ID: William Fuller, male    DOB: 09/26/1970, 48 y.o.   MRN: [id number]  HPI  Pt in for follow up  He went to ED. He was diagnose with the flu past week. Pt had fevers, chills and sweats but those symptoms  went away with treatment. Pt state he still feels some chest congestion. He has some persisting cough and is occasionally a productive cough. Pt does not have any nausea or vomiting.  ED was given tamiflu for flu. Also given tussionex for the cough.  Pt states since he had the flu he has had frontal ha. He states just recently saw some prominent vessels on both temporal areas. No nausea, vomiting or gross motor/sensory function deficits. BP is not elevated. No hx of chronich.   Review of Systems  Constitutional: Positive for fatigue. Negative for appetite change, chills and fever.       Some residual fatigue.  HENT: Positive for congestion and sinus pressure. Negative for postnasal drip and sore throat.        Some pain in his cheeks around time dx with the flu.  Respiratory: Positive for cough. Negative for chest tightness, shortness of breath and wheezing.   Cardiovascular: Negative for chest pain and palpitations.  Gastrointestinal: Negative for abdominal pain.  Genitourinary: Negative for discharge, dysuria, enuresis and frequency.  Musculoskeletal: Positive for back pain.       Also dull constant pain in lower back with lt si features to left hamstring since last week.  Skin: Negative for rash.  Neurological: Negative for dizziness and headaches.  Hematological: Negative for adenopathy. Does not bruise/bleed easily.  Psychiatric/Behavioral: Negative for behavioral problems and confusion.    Past Medical History:  Diagnosis Date  . Allergy   . Focal nodular hyperplasia of liver   . GERD (gastroesophageal reflux disease)   . Hypertension   . Intestinal metaplasia of gastric mucosa      Social History   Socioeconomic History  . Marital status:  Single    Spouse name: Not on file  . Number of children: Not on file  . Years of education: Not on file  . Highest education level: Not on file  Occupational History  . Not on file  Social Needs  . Financial resource strain: Not on file  . Food insecurity:    Worry: Not on file    Inability: Not on file  . Transportation needs:    Medical: Not on file    Non-medical: Not on file  Tobacco Use  . Smoking status: Former Smoker    Last attempt to quit: 06/12/2015    Years since quitting: 3.3  . Smokeless tobacco: Never Used  Substance and Sexual Activity  . Alcohol use: No    Alcohol/week: 0.0 standard drinks  . Drug use: Yes    Types: Marijuana    Comment: pt smokes twice a week. Each time 2 joints.  . Sexual activity: Yes  Lifestyle  . Physical activity:    Days per week: Not on file    Minutes per session: Not on file  . Stress: Not on file  Relationships  . Social connections:    Talks on phone: Not on file    Gets together: Not on file    Attends religious service: Not on file    Active member of club or organization: Not on file    Attends meetings of clubs or organizations: Not on file    Relationship  status: Not on file  . Intimate partner violence:    Fear of current or ex partner: Not on file    Emotionally abused: Not on file    Physically abused: Not on file    Forced sexual activity: Not on file  Other Topics Concern  . Not on file  Social History Narrative  . Not on file    Past Surgical History:  Procedure Laterality Date  . HERNIA REPAIR      Family History  Problem Relation Age of Onset  . Hypertension Other   . CAD Other   . Breast cancer Mother   . Heart disease Mother   . Heart disease Brother   . Heart disease Maternal Grandmother   . Hypertension Maternal Grandmother   . Colon cancer Neg Hx     No Known Allergies  Current Outpatient Medications on File Prior to Visit  Medication Sig Dispense Refill  .  chlorpheniramine-HYDROcodone (TUSSIONEX PENNKINETIC ER) 10-8 MG/5ML SUER Take 5 mLs by mouth every 12 (twelve) hours as needed for cough. 60 mL 0  . fluticasone (FLONASE) 50 MCG/ACT nasal spray Place 2 sprays into both nostrils daily. 16 g 0  . ibuprofen (ADVIL,MOTRIN) 600 MG tablet Take 1 tablet (600 mg total) by mouth every 6 (six) hours as needed. 30 tablet 0  . losartan (COZAAR) 25 MG tablet Take 1 tablet (25 mg total) by mouth daily. 30 tablet 0  . ondansetron (ZOFRAN ODT) 8 MG disintegrating tablet 1/2- 1 tablet q 8 hr prn nausea, vomiting 20 tablet 0  . oseltamivir (TAMIFLU) 75 MG capsule Take 1 capsule (75 mg total) by mouth 2 (two) times daily. X 5 days 10 capsule 0  . pantoprazole (PROTONIX) 40 MG tablet Take 1 tablet (40 mg total) by mouth 2 (two) times daily. 120 tablet 1  . ranitidine (ZANTAC) 150 MG capsule Take 1 capsule (150 mg total) by mouth 2 (two) times daily. 60 capsule 0  . sertraline (ZOLOFT) 25 MG tablet Take 1 tablet (25 mg total) by mouth daily. 14 tablet 0  . SUMAtriptan (IMITREX) 50 MG tablet Take 1 tablet (50 mg total) by mouth every 2 (two) hours as needed for migraine. May repeat in 2 hours if headache persists or recurs.(max 2 tab in 24 hour period) 10 tablet 0  . triamcinolone cream (KENALOG) 0.1 % Apply 1 application topically 2 (two) times daily. 30 g 0   Current Facility-Administered Medications on File Prior to Visit  Medication Dose Route Frequency Provider Last Rate Last Dose  . 0.9 %  sodium chloride infusion  500 mL Intravenous Continuous Armbruster, Carlota Raspberry, MD        BP 128/71   Pulse (!) 101   Temp 98.8 F (37.1 C) (Oral)   Resp 16   Ht 5\' 10"  (1.778 m)   Wt 254 lb 3.2 oz (115.3 kg)   SpO2 99%   BMI 36.47 kg/m       Objective:   Physical Exam  General  Mental Status - Alert. General Appearance - Well groomed. Not in acute distress.  Skin Rashes- No Rashes.  HEENT Head- Normal. Ear Auditory Canal - Left- Normal. Right -  Normal.Tympanic Membrane- Left- Normal. Right- Normal. Eye Sclera/Conjunctiva- Left- Normal. Right- Normal. Nose & Sinuses Nasal Mucosa- Left-  Boggy and Congested. Right-  Boggy and  Congested.Bilateral no  maxillary and   No frontal sinus pressure. Mouth & Throat Lips: Upper Lip- Normal: no dryness, cracking, pallor, cyanosis, or vesicular  eruption. Lower Lip-Normal: no dryness, cracking, pallor, cyanosis or vesicular eruption. Buccal Mucosa- Bilateral- No Aphthous ulcers. Oropharynx- No Discharge or Erythema. Tonsils: Characteristics- Bilateral- No Erythema or Congestion. Size/Enlargement- Bilateral- No enlargement. Discharge- bilateral-None.  Neck Neck- Supple. No Masses.   Chest and Lung Exam Auscultation: Breath Sounds:-Clear even and unlabored.  Cardiovascular Auscultation:Rythm- Regular, rate and rhythm. Murmurs & Other Heart Sounds:Ausculatation of the heart reveal- No Murmurs.  Lymphatic Head & Neck General Head & Neck Lymphatics: Bilateral: Description- No Localized lymphadenopathy.   Back Mid lumbar spine tenderness to palpation. Pain on straight leg lift. Pain on lateral movements and flexion/extension of the spine.   Neurologic Cranial Nerve exam:- CN III-XII intact(No nystagmus), symmetric smile. Drift Test:- No drift. Romberg Exam:- Negative.  Heal to Toe Gait exam:-Normal. Finger to Nose:- Normal/Intact Strength:- 5/5 equal and symmetric strength both upper and lower extremities.   Lower ext neurologic L5-S1 sensation intact bilaterally. Normal patellar reflexes bilaterally. No foot drop bilaterally.       Assessment & Plan:  You do have some bronchitis type symptoms post flu treatment.  Since you do report feeling some residual fatigue and still having some intermittent productive cough, I do want you to get a chest x-ray to make sure you do not have any walking pneumonia.  Provided chest x-ray is clear will just give you a azithromycin antibiotic  and benzonatate for cough.  If your chest x-ray were to come back positive then would need also gave you additional antibiotic.  You do have some intermittent headaches since onset of flu syndrome.  Some temporal region headaches at times so we will get sed rate.  If that is elevated then would need to give you prednisone.  Today in the office will give you Toradol 30 mg IM injection.  Your blood pressure is elevated today but you did not take your blood pressure medication.  Since blood pressure was borderline elevated went with the lower dose of Toradol as NSAIDs can increase blood pressure.  Please make sure you start your blood pressure medication when you get home this morning.  You do have some lower mid line lumbar region pain since onset of flu syndrome.  Some left-sided sciatica features.  Toradol might give you some relief presently.  Also will make Flexeril available for you to use at night over the next 5 nights.  Would recommend using low-dose ibuprofen 200 to 400 mg every 8 hours as needed.  Follow-up in 7 to 10 days or as needed.  [name], PA-C

## 2018-10-03 NOTE — Patient Instructions (Addendum)
You do have some bronchitis type symptoms post flu treatment.  Since you do report feeling some residual fatigue and still having some intermittent productive cough, I do want you to get a chest x-ray to make sure you do not have any walking pneumonia.  Provided chest x-ray is clear will just give you a azithromycin antibiotic and benzonatate for cough.  If your chest x-ray were to come back positive then would need also gave you additional antibiotic.  You do have some intermittent headaches since onset of flu syndrome.  Some temporal region headaches at times so we will get sed rate.  If that is elevated then would need to give you prednisone.  Today in the office will give you Toradol 30 mg IM injection.  Your blood pressure is elevated today but you did not take your blood pressure medication.  Since blood pressure was borderline elevated went with the lower dose of Toradol as NSAIDs can increase blood pressure.  Please make sure you start your blood pressure medication when you get home this morning.  You do have some lower mid line lumbar region pain since onset of flu syndrome.  Some left-sided sciatica features.  Toradol might give you some relief presently.  Also will make Flexeril available for you to use at night over the next 5 nights.  Would recommend using low-dose ibuprofen 200 to 400 mg every 8 hours as needed.  up in 7 to 10 days or as needed.

## 2018-10-04 ENCOUNTER — Telehealth: Payer: Self-pay | Admitting: Medical

## 2018-10-04 DIAGNOSIS — D473 Essential (hemorrhagic) thrombocythemia: Secondary | ICD-10-CM

## 2018-10-04 DIAGNOSIS — D75839 Thrombocytosis, unspecified: Secondary | ICD-10-CM

## 2018-10-04 NOTE — Telephone Encounter (Signed)
2nd referral to hematologist placed.

## 2018-10-05 ENCOUNTER — Telehealth: Payer: Self-pay | Admitting: Medical

## 2018-10-05 NOTE — Telephone Encounter (Signed)
Copied from [place] [id number]. Topic: General - Other >> Oct 05, 2018  4:23 PM Keene Breath wrote: Reason for CRM: Nevin Bloodgood with [facility] called to get some clarity on a referral to cardiology.  Please advise and call back at [phone]

## 2018-10-06 ENCOUNTER — Telehealth: Payer: Self-pay | Admitting: Hematology

## 2018-10-06 ENCOUNTER — Ambulatory Visit: Payer: Self-pay | Admitting: *Deleted

## 2018-10-06 NOTE — Telephone Encounter (Signed)
lmom for pt to return call to office re new patient appointment. Mailed appt letter for 10/25/18 at 12 pm

## 2018-10-06 NOTE — Telephone Encounter (Signed)
Pt calling with complaints of having 2 episodes of bloody stool of yesterday but none today. Pt does have complaints of abdominal pain that he rates at 7 at this time. Pt thinks that bloody stools may be due to a recent prescription of which the pt states is an antibiotic, but does not have the name of the medication. Pt also complains of having reflux even with taking reflux medication that is causing him not to be able to eat or drink anything. Pt states he is currently at work and does not get off until 5 pm today. Pt scheduled for appt tomorrow and advised that if symptoms become worse tonight to seek treatment in the ED.  Pt verbalized understanding.  Reason for Disposition . MODERATE rectal bleeding (small blood clots, passing blood without stool, or toilet water turns red)  Answer Assessment - Initial Assessment Questions 1. APPEARANCE of BLOOD: "What color is it?" "Is it passed separately, on the surface of the stool, or mixed in with the stool?"      1st time it was mixed and the 2nd time it was just blood 2. AMOUNT: "How much blood was passed?"      Pt states that it turned the water red 3. FREQUENCY: "How many times has blood been passed with the stools?"      2 times, but none today 4. ONSET: "When was the blood first seen in the stools?" (Days or weeks)      yesterday 5. RECURRENT SYMPTOMS: "Have you had blood in your stools before?" If so, ask: "When was the last time?" and "What happened that time?"      No 6. BLOOD THINNERS: "Do you take any blood thinners?" (e.g., Coumadin/warfarin, Pradaxa/dabigatran, aspirin)     No 7. OTHER SYMPTOMS: "Do you have any other symptoms?"  (e.g., abdominal pain, vomiting, dizziness, fever)    Abdominal pain  Protocols used: RECTAL BLEEDING-A-AH

## 2018-10-07 ENCOUNTER — Encounter: Payer: Self-pay | Admitting: Medical

## 2018-10-07 ENCOUNTER — Ambulatory Visit: Payer: BC Managed Care – PPO | Admitting: Medical

## 2018-10-07 VITALS — BP 140/92 | HR 110 | Temp 98.6°F | Resp 16 | Ht 70.0 in | Wt 253.2 lb

## 2018-10-07 DIAGNOSIS — K921 Melena: Secondary | ICD-10-CM | POA: Diagnosis not present

## 2018-10-07 DIAGNOSIS — R109 Unspecified abdominal pain: Secondary | ICD-10-CM

## 2018-10-07 DIAGNOSIS — J40 Bronchitis, not specified as acute or chronic: Secondary | ICD-10-CM | POA: Diagnosis not present

## 2018-10-07 DIAGNOSIS — R112 Nausea with vomiting, unspecified: Secondary | ICD-10-CM

## 2018-10-07 DIAGNOSIS — K219 Gastro-esophageal reflux disease without esophagitis: Secondary | ICD-10-CM

## 2018-10-07 LAB — CBC WITH DIFFERENTIAL/PLATELET
BASOS ABS: 0 10*3/uL (ref 0.0–0.1)
Basophils Relative: 0.5 % (ref 0.0–3.0)
Eosinophils Absolute: 0 10*3/uL (ref 0.0–0.7)
Eosinophils Relative: 0.7 % (ref 0.0–5.0)
HCT: 44.7 % (ref 39.0–52.0)
Hemoglobin: 14.8 g/dL (ref 13.0–17.0)
LYMPHS ABS: 2.5 10*3/uL (ref 0.7–4.0)
Lymphocytes Relative: 34.5 % (ref 12.0–46.0)
MCHC: 33.1 g/dL (ref 30.0–36.0)
MCV: 90.7 fl (ref 78.0–100.0)
MONO ABS: 0.7 10*3/uL (ref 0.1–1.0)
Monocytes Relative: 9.9 % (ref 3.0–12.0)
NEUTROS PCT: 54.4 % (ref 43.0–77.0)
Neutro Abs: 3.9 10*3/uL (ref 1.4–7.7)
Platelets: 755 10*3/uL — ABNORMAL HIGH (ref 150.0–400.0)
RBC: 4.93 Mil/uL (ref 4.22–5.81)
RDW: 13.9 % (ref 11.5–15.5)
WBC: 7.2 10*3/uL (ref 4.0–10.5)

## 2018-10-07 LAB — COMPREHENSIVE METABOLIC PANEL
ALT: 18 U/L (ref 0–53)
AST: 20 U/L (ref 0–37)
Albumin: 4.3 g/dL (ref 3.5–5.2)
Alkaline Phosphatase: 41 U/L (ref 39–117)
BUN: 12 mg/dL (ref 6–23)
CO2: 31 mEq/L (ref 19–32)
Calcium: 9.6 mg/dL (ref 8.4–10.5)
Chloride: 100 mEq/L (ref 96–112)
Creatinine, Ser: 1.12 mg/dL (ref 0.40–1.50)
GFR: 84.36 mL/min (ref >60.00)
Glucose, Bld: 82 mg/dL (ref 70–99)
Potassium: 4.8 mEq/L (ref 3.5–5.1)
Sodium: 137 mEq/L (ref 135–145)
Total Bilirubin: 0.5 mg/dL (ref 0.2–1.2)
Total Protein: 7.1 g/dL (ref 6.0–8.3)

## 2018-10-07 LAB — AMYLASE: Amylase: 66 U/L (ref 27–131)

## 2018-10-07 LAB — LIPASE: Lipase: 11 U/L (ref 11.0–59.0)

## 2018-10-07 MED ORDER — FAMOTIDINE 20 MG PO TABS: ORAL_TABLET | ORAL | 0 refills | Status: DC

## 2018-10-07 MED ORDER — ONDANSETRON 8 MG PO TBDP: ORAL_TABLET | ORAL | 0 refills | Status: DC

## 2018-10-07 NOTE — Progress Notes (Signed)
Subjective:    Patient ID: William Fuller, male    DOB: 11/28/1970, 48 y.o.   MRN: [id number]  HPI  Pt in for some upset stomach. He states since starting the zpack he has been burping and increased heart burn. He stopped zpack after 2nd day. He states symptoms some improved but still has upset stomach. He tried some probiotics. He states he can taste sour acid in throat.  Pt states feels better from bronchitis now. Just residual cough.   Pt has hx of internal hemmoroids in past. Last colonoscopy in 2018. Pt states told to repeat in 5 years.  Pt is on protonix. No longer using zantac.   He also reports some recent bright red blood in his stool. At first he states redness only in stool. But one stool just looked like blood. Some mild itching to rectum. Not having any diarrhea. Last saw blood two days ago.    Review of Systems  Constitutional: Negative for chills, fatigue and fever.  Respiratory: Negative for cough, chest tightness, shortness of breath and wheezing.   Cardiovascular: Negative for chest pain and palpitations.  Gastrointestinal: Positive for abdominal pain, blood in stool and vomiting. Negative for abdominal distention, constipation, diarrhea and nausea.       States food will come back up.  Genitourinary: Negative for dysuria and flank pain.  Musculoskeletal: Negative for back pain, myalgias and neck stiffness.  Skin: Negative for color change and rash.  Neurological: Negative for facial asymmetry, speech difficulty, weakness, numbness and headaches.  Hematological: Negative for adenopathy. Does not bruise/bleed easily.  Psychiatric/Behavioral: Negative for behavioral problems, confusion, decreased concentration and dysphoric mood. The patient is not nervous/anxious.     Past Medical History:  Diagnosis Date  . Allergy   . Focal nodular hyperplasia of liver   . GERD (gastroesophageal reflux disease)   . Hypertension   . Intestinal metaplasia of gastric mucosa        Social History   Socioeconomic History  . Marital status: Single    Spouse name: Not on file  . Number of children: Not on file  . Years of education: Not on file  . Highest education level: Not on file  Occupational History  . Not on file  Social Needs  . Financial resource strain: Not on file  . Food insecurity:    Worry: Not on file    Inability: Not on file  . Transportation needs:    Medical: Not on file    Non-medical: Not on file  Tobacco Use  . Smoking status: Former Smoker    Last attempt to quit: 06/12/2015    Years since quitting: 3.3  . Smokeless tobacco: Never Used  Substance and Sexual Activity  . Alcohol use: No    Alcohol/week: 0.0 standard drinks  . Drug use: Yes    Types: Marijuana    Comment: pt smokes twice a week. Each time 2 joints.  . Sexual activity: Yes  Lifestyle  . Physical activity:    Days per week: Not on file    Minutes per session: Not on file  . Stress: Not on file  Relationships  . Social connections:    Talks on phone: Not on file    Gets together: Not on file    Attends religious service: Not on file    Active member of club or organization: Not on file    Attends meetings of clubs or organizations: Not on file    Relationship status: Not  on file  . Intimate partner violence:    Fear of current or ex partner: Not on file    Emotionally abused: Not on file    Physically abused: Not on file    Forced sexual activity: Not on file  Other Topics Concern  . Not on file  Social History Narrative  . Not on file    Past Surgical History:  Procedure Laterality Date  . HERNIA REPAIR      Family History  Problem Relation Age of Onset  . Hypertension Other   . CAD Other   . Breast cancer Mother   . Heart disease Mother   . Heart disease Brother   . Heart disease Maternal Grandmother   . Hypertension Maternal Grandmother   . Colon cancer Neg Hx     No Known Allergies  Current Outpatient Medications on File Prior to  Visit  Medication Sig Dispense Refill  . azithromycin (ZITHROMAX) 250 MG tablet Take 2 tablets by mouth on day 1, followed by 1 tablet by mouth daily for 4 days. 6 tablet 0  . chlorpheniramine-HYDROcodone (TUSSIONEX PENNKINETIC ER) 10-8 MG/5ML SUER Take 5 mLs by mouth every 12 (twelve) hours as needed for cough. 60 mL 0  . cyclobenzaprine (FLEXERIL) 10 MG tablet Take 1 tablet (10 mg total) by mouth at bedtime. 5 tablet 0  . fluticasone (FLONASE) 50 MCG/ACT nasal spray Place 2 sprays into both nostrils daily. 16 g 0  . ibuprofen (ADVIL,MOTRIN) 600 MG tablet Take 1 tablet (600 mg total) by mouth every 6 (six) hours as needed. 30 tablet 0  . losartan (COZAAR) 25 MG tablet Take 1 tablet (25 mg total) by mouth daily. 30 tablet 0  . ondansetron (ZOFRAN ODT) 8 MG disintegrating tablet 1/2- 1 tablet q 8 hr prn nausea, vomiting 20 tablet 0  . oseltamivir (TAMIFLU) 75 MG capsule Take 1 capsule (75 mg total) by mouth 2 (two) times daily. X 5 days 10 capsule 0  . pantoprazole (PROTONIX) 40 MG tablet Take 1 tablet (40 mg total) by mouth 2 (two) times daily. 120 tablet 1  . sertraline (ZOLOFT) 25 MG tablet Take 1 tablet (25 mg total) by mouth daily. 14 tablet 0  . SUMAtriptan (IMITREX) 50 MG tablet Take 1 tablet (50 mg total) by mouth every 2 (two) hours as needed for migraine. May repeat in 2 hours if headache persists or recurs.(max 2 tab in 24 hour period) 10 tablet 0  . triamcinolone cream (KENALOG) 0.1 % Apply 1 application topically 2 (two) times daily. 30 g 0   Current Facility-Administered Medications on File Prior to Visit  Medication Dose Route Frequency Provider Last Rate Last Dose  . 0.9 %  sodium chloride infusion  500 mL Intravenous Continuous Armbruster, Carlota Raspberry, MD        BP (!) 140/92   Pulse (!) 110   Temp 98.6 F (37 C) (Oral)   Resp 16   Ht 5\' 10"  (1.778 m)   Wt 253 lb 3.2 oz (114.9 kg)   SpO2 100%   BMI 36.33 kg/m       Objective:   Physical Exam  General  Mental Status  - Alert. General Appearance - Well groomed. Not in acute distress.  Skin Rashes- No Rashes.  HEENT Head- Normal. Ear Auditory Canal - Left- Normal. Right - Normal.Tympanic Membrane- Left- Normal. Right- Normal. Eye Sclera/Conjunctiva- Left- Normal. Right- Normal. Nose & Sinuses Nasal Mucosa- Left-  Boggy and Congested. Right-  Boggy and  Congested.Bilateral  no maxillary and no  frontal sinus pressure. Mouth & Throat Lips: Upper Lip- Normal: no dryness, cracking, pallor, cyanosis, or vesicular eruption. Lower Lip-Normal: no dryness, cracking, pallor, cyanosis or vesicular eruption. Buccal Mucosa- Bilateral- No Aphthous ulcers. Oropharynx- No Discharge or Erythema. Tonsils: Characteristics- Bilateral- No Erythema or Congestion. Size/Enlargement- Bilateral- No enlargement. Discharge- bilateral-None.  Neck Neck- Supple. No Masses.   Chest and Lung Exam Auscultation: Breath Sounds:-Clear even and unlabored.  Cardiovascular Auscultation:Rythm- Regular, rate and rhythm. Murmurs & Other Heart Sounds:Ausculatation of the heart reveal- No Murmurs.  Lymphatic Head & Neck General Head & Neck Lymphatics: Bilateral: Description- No Localized lymphadenopathy.  Rectum-no external hemorrhoid. Stool card for blood negative. Normal sphincter tone. No mass felt.  Abdomen- soft, nt, nd, +bs, no rebound or guarding.      Assessment & Plan:  You do appear to have recent flare of your GERD since starting the antibiotic.  Some symptom improvement when you stopped azithromycin.  I want you to continue with Protonix, probiotics and I am adding famotidine.  If you have any nausea or vomiting can use Zofran.  With recent abdomen pain along with the hematochezia, I decided to get CBC, CMP, amylase and lipase.  Your stool test today was negative for blood.  2018 colonoscopy did show some internal hemorrhoids.  No bright red blood reported for 2 days/since he stopped antibiotic.  If you get any recurrent  bright red blood in stools or if he getting black stools let me know.  In that event I would refer you back to your gastroenterologist.  Your bronchitis type symptoms seem to be improved.  Consider giving you injection antibiotic but after doing physical exam decided further antibiotics not needed.   Follow-up in 7 days for any lingering signs and symptoms or sooner for worsening or new symptoms.   [name], PA-C

## 2018-10-07 NOTE — Patient Instructions (Addendum)
You do appear to have recent flare of your GERD since starting the antibiotic.  Some symptom improvement when you stopped azithromycin.  I want you to continue with Protonix, probiotics and I am adding famotidine.  If you have any nausea or vomiting can use Zofran.  With recent abdomen pain along with the hematochezia, I decided to get CBC, CMP, amylase and lipase.  Your stool test today was negative for blood.  2018 colonoscopy did show some internal hemorrhoids.  No bright red blood reported for 2 days/since he stopped antibiotic.  If you get any recurrent bright red blood in stools or if he getting black stools let me know.  In that event I would refer you back to your gastroenterologist.  Your bronchitis type symptoms seem to be improved.  Consider giving you injection antibiotic but after doing physical exam decided further antibiotics not needed.   Follow-up in 7 days for any lingering signs and symptoms or sooner for worsening or new symptoms.

## 2018-10-11 ENCOUNTER — Ambulatory Visit: Payer: BC Managed Care – PPO | Admitting: Medical

## 2018-10-21 ENCOUNTER — Other Ambulatory Visit: Payer: Self-pay | Admitting: Hematology

## 2018-10-21 DIAGNOSIS — D75839 Thrombocytosis, unspecified: Secondary | ICD-10-CM

## 2018-10-21 DIAGNOSIS — D473 Essential (hemorrhagic) thrombocythemia: Secondary | ICD-10-CM | POA: Insufficient documentation

## 2018-10-21 NOTE — Progress Notes (Signed)
[facility] CONSULT NOTE  Patient Care Team: Saguier, Iris Pert as PCP - General (Internal Medicine)  HEME/ONC OVERVIEW: 1. Thrombocytosis  -Plts 500-700k dating back 2011; nl Hgb and WBC  ASSESSMENT & PLAN:   Thrombocytosis  -I reviewed the patient's records in detail, including PCP clinic notes and the lab studies dating back to 2011 -In summary, patient has had chronic, mild leukocytosis with platelet count between 500 and 700k's dating back to 2011. CBC showed normal Hgb and WBC.  He has been followed by gastroenterology for history of focal nodular hyperplasia of the liver, and has had several liver imaging studies that did not show any evidence of liver cirrhosis or other intra-abdominal abnormaliteis. EGD and colonoscopy in 2018 were negative for malignancy, but did demonstrate mild reflux esophagitis.  -Plts 565k today, stable; Hgb and WBC normal  -I personally reviewed the patient's peripheral blood smear, which showed increased platelet number with occasional giant platelets; there were also a few scattered atypical lymphocytes, which may be due to the patient's recent viral illness in early 10/2018  -Given the persistent leukocytosis, I have ordered MPN NGS and BCR/ABL FISH to rule out myeloproliferative disease (such as ET) and CML, respectively -Patient is up-to-date with his age-appropriate cancer screening, including colonoscopy; I encouraged the patient to follow up with his PCP regarding lung cancer screening, given his history of tobacco use   Orders Placed This Encounter  Procedures  . CBC with Differential (Cancer Center Only)    Standing Status:   Future    Standing Expiration Date:   11/29/2019  . CMP ([facility] only)    Standing Status:   Future    Standing Expiration Date:   11/29/2019  . Save Smear ([facility])    Standing Status:   Future    Standing Expiration Date:   10/25/2019  . Lactate dehydrogenase    Standing Status:   Future    Standing  Expiration Date:   11/29/2019   All questions were answered. The patient knows to call the clinic with any problems, questions or concerns.  Return in 2 months for labs and clinic follow-up.   Tish Men, MD 10/25/2018 2:00 PM   CHIEF COMPLAINTS/PURPOSE OF CONSULTATION:  "I am told that my platelet count is high"  HISTORY OF PRESENTING ILLNESS:  William Fuller 48 y.o. male is here because of thrombocytosis.  Patient reports that he was recently diagnosed with flu after he presented to urgent care with rhinorrhea, sore throat, occasional cough and generalized myalgia and arthralgia.  He was prescribed Tamiflu as well as azithromycin, but azithromycin caused GI side effects, prompting him to stop the antibiotic prescription.  Since then, he has recovered from the recent illness, and reports feeling well today and denies any complaint.  He has history of smoking, which he quit in 2015, but he smoked approximately 1 pack/week for about 10 years.  He drinks alcohol occasionally, but denies any other illicit drug use.  He denies any family history of lymphoproliferative disorder or leukemia.  He denies any major history in the past.  MEDICAL HISTORY:  Past Medical History:  Diagnosis Date  . Allergy   . Focal nodular hyperplasia of liver   . GERD (gastroesophageal reflux disease)   . Hypertension   . Intestinal metaplasia of gastric mucosa     SURGICAL HISTORY: Past Surgical History:  Procedure Laterality Date  . HERNIA REPAIR      SOCIAL HISTORY: Social History   Socioeconomic History  .  Marital status: Single    Spouse name: Not on file  . Number of children: Not on file  . Years of education: Not on file  . Highest education level: Not on file  Occupational History  . Not on file  Social Needs  . Financial resource strain: Not on file  . Food insecurity:    Worry: Not on file    Inability: Not on file  . Transportation needs:    Medical: Not on file    Non-medical: Not on  file  Tobacco Use  . Smoking status: Former Smoker    Types: Cigarettes    Last attempt to quit: 06/12/2015    Years since quitting: 3.3  . Smokeless tobacco: Never Used  Substance and Sexual Activity  . Alcohol use: No    Alcohol/week: 0.0 standard drinks  . Drug use: Yes    Types: Marijuana    Comment: pt smokes twice a week. Each time 2 joints.  . Sexual activity: Yes  Lifestyle  . Physical activity:    Days per week: Not on file    Minutes per session: Not on file  . Stress: Not on file  Relationships  . Social connections:    Talks on phone: Not on file    Gets together: Not on file    Attends religious service: Not on file    Active member of club or organization: Not on file    Attends meetings of clubs or organizations: Not on file    Relationship status: Not on file  . Intimate partner violence:    Fear of current or ex partner: Not on file    Emotionally abused: Not on file    Physically abused: Not on file    Forced sexual activity: Not on file  Other Topics Concern  . Not on file  Social History Narrative  . Not on file    FAMILY HISTORY: Family History  Problem Relation Age of Onset  . Hypertension Other   . CAD Other   . Breast cancer Mother   . Heart disease Mother   . Heart disease Brother   . Heart disease Maternal Grandmother   . Hypertension Maternal Grandmother   . Colon cancer Neg Hx     ALLERGIES:  has No Known Allergies.  MEDICATIONS:  Current Outpatient Medications  Medication Sig Dispense Refill  . cyclobenzaprine (FLEXERIL) 10 MG tablet Take 1 tablet (10 mg total) by mouth at bedtime. 5 tablet 0  . famotidine (PEPCID) 20 MG tablet 1-2 tab po q day 60 tablet 0  . fluticasone (FLONASE) 50 MCG/ACT nasal spray Place 2 sprays into both nostrils daily. 16 g 0  . ibuprofen (ADVIL,MOTRIN) 600 MG tablet Take 1 tablet (600 mg total) by mouth every 6 (six) hours as needed. 30 tablet 0  . losartan (COZAAR) 25 MG tablet Take 1 tablet (25 mg  total) by mouth daily. 30 tablet 0  . pantoprazole (PROTONIX) 40 MG tablet Take 1 tablet (40 mg total) by mouth 2 (two) times daily. 120 tablet 1  . sertraline (ZOLOFT) 25 MG tablet Take 1 tablet (25 mg total) by mouth daily. 14 tablet 0  . SUMAtriptan (IMITREX) 50 MG tablet Take 1 tablet (50 mg total) by mouth every 2 (two) hours as needed for migraine. May repeat in 2 hours if headache persists or recurs.(max 2 tab in 24 hour period) 10 tablet 0  . triamcinolone cream (KENALOG) 0.1 % Apply 1 application topically 2 (two) times  daily. 30 g 0  . ondansetron (ZOFRAN ODT) 8 MG disintegrating tablet 1/2- 1 tablet q 8 hr prn nausea, vomiting (Patient not taking: Reported on 10/25/2018) 20 tablet 0   Current Facility-Administered Medications  Medication Dose Route Frequency Provider Last Rate Last Dose  . 0.9 %  sodium chloride infusion  500 mL Intravenous Continuous Armbruster, Carlota Raspberry, MD        REVIEW OF SYSTEMS:   Constitutional: ( - ) fevers, ( - )  chills , ( - ) night sweats Eyes: ( - ) blurriness of vision, ( - ) double vision, ( - ) watery eyes Ears, nose, mouth, throat, and face: ( - ) mucositis, ( - ) sore throat Respiratory: ( - ) cough, ( - ) dyspnea, ( - ) wheezes Cardiovascular: ( - ) palpitation, ( - ) chest discomfort, ( - ) lower extremity swelling Gastrointestinal:  ( - ) nausea, ( - ) heartburn, ( - ) change in bowel habits Skin: ( - ) abnormal skin rashes Lymphatics: ( - ) new lymphadenopathy, ( - ) easy bruising Neurological: ( - ) numbness, ( - ) tingling, ( - ) new weaknesses Behavioral/Psych: ( - ) mood change, ( - ) new changes  All other systems were reviewed with the patient and are negative.  PHYSICAL EXAMINATION: ECOG PERFORMANCE STATUS: 0 - Asymptomatic  Vitals:   10/25/18 1227  BP: 132/80  Pulse: 67  Resp: 18  Temp: 98.4 F (36.9 C)  SpO2: 100%   Filed Weights   10/25/18 1227  Weight: 251 lb 1.9 oz (113.9 kg)    GENERAL: alert, no distress and  comfortable SKIN: skin color, texture, turgor are normal, no rashes or significant lesions EYES: conjunctiva are pink and non-injected, sclera clear OROPHARYNX: no exudate, no erythema; lips, buccal mucosa, and tongue normal  NECK: supple, non-tender LYMPH:  no palpable lymphadenopathy in the cervical LUNGS: clear to auscultation with normal breathing effort HEART: regular rate & rhythm, no murmurs, no lower extremity edema ABDOMEN: soft, non-tender, non-distended, normal bowel sounds Musculoskeletal: no cyanosis of digits and no clubbing  PSYCH: alert & oriented x 3, fluent speech NEURO: no focal motor/sensory deficits  LABORATORY DATA:  I have reviewed the data as listed Lab Results  Component Value Date   WBC 5.9 10/25/2018   HGB 14.6 10/25/2018   HCT 44.8 10/25/2018   MCV 92.2 10/25/2018   PLT 565 (H) 10/25/2018   Lab Results  Component Value Date   NA 141 10/25/2018   K 4.3 10/25/2018   CL 104 10/25/2018   CO2 29 10/25/2018    RADIOGRAPHIC STUDIES: I have personally reviewed the radiological images as listed and agreed with the findings in the report. Dg Chest 2 View  Result Date: 10/03/2018 CLINICAL DATA:  Cough and fever for 1 week EXAM: CHEST - 2 VIEW COMPARISON:  04/07/2011 FINDINGS: The heart size and mediastinal contours are within normal limits. Both lungs are clear. The visualized skeletal structures are unremarkable. IMPRESSION: No active cardiopulmonary disease. Electronically Signed   By: Inez Catalina M.D.   On: 10/03/2018 10:56    PATHOLOGY: I personally reviewed the patient's peripheral blood smear today.  The red blood cells were of normal morphology.  There was no schistocytosis.  The white blood cells were of normal morphology except a few scattered atypical lymphocytes. There were no peripheral circulating blasts. The platelets were slightly increased in number but otherwise normal size. I verified that there were no platelet clumping.

## 2018-10-25 ENCOUNTER — Inpatient Hospital Stay: Payer: BC Managed Care – PPO

## 2018-10-25 ENCOUNTER — Encounter: Payer: Self-pay | Admitting: Hematology

## 2018-10-25 ENCOUNTER — Inpatient Hospital Stay: Payer: BC Managed Care – PPO | Attending: Hematology & Oncology | Admitting: Hematology

## 2018-10-25 ENCOUNTER — Other Ambulatory Visit: Payer: Self-pay

## 2018-10-25 VITALS — BP 132/80 | HR 67 | Temp 98.4°F | Resp 18 | Ht 70.0 in | Wt 251.1 lb

## 2018-10-25 DIAGNOSIS — K210 Gastro-esophageal reflux disease with esophagitis: Secondary | ICD-10-CM | POA: Insufficient documentation

## 2018-10-25 DIAGNOSIS — Z803 Family history of malignant neoplasm of breast: Secondary | ICD-10-CM | POA: Insufficient documentation

## 2018-10-25 DIAGNOSIS — Z8249 Family history of ischemic heart disease and other diseases of the circulatory system: Secondary | ICD-10-CM | POA: Insufficient documentation

## 2018-10-25 DIAGNOSIS — Z791 Long term (current) use of non-steroidal anti-inflammatories (NSAID): Secondary | ICD-10-CM | POA: Diagnosis not present

## 2018-10-25 DIAGNOSIS — D473 Essential (hemorrhagic) thrombocythemia: Secondary | ICD-10-CM

## 2018-10-25 DIAGNOSIS — Z79899 Other long term (current) drug therapy: Secondary | ICD-10-CM | POA: Diagnosis not present

## 2018-10-25 DIAGNOSIS — I10 Essential (primary) hypertension: Secondary | ICD-10-CM | POA: Insufficient documentation

## 2018-10-25 DIAGNOSIS — D75839 Thrombocytosis, unspecified: Secondary | ICD-10-CM

## 2018-10-25 DIAGNOSIS — Z87891 Personal history of nicotine dependence: Secondary | ICD-10-CM | POA: Diagnosis not present

## 2018-10-25 DIAGNOSIS — R7989 Other specified abnormal findings of blood chemistry: Secondary | ICD-10-CM | POA: Diagnosis not present

## 2018-10-25 LAB — CBC WITH DIFFERENTIAL (CANCER CENTER ONLY)
Abs Immature Granulocytes: 0.03 10*3/uL (ref 0.00–0.07)
Basophils Absolute: 0 10*3/uL (ref 0.0–0.1)
Basophils Relative: 1 %
Eosinophils Absolute: 0.1 10*3/uL (ref 0.0–0.5)
Eosinophils Relative: 2 %
HCT: 44.8 % (ref 39.0–52.0)
Hemoglobin: 14.6 g/dL (ref 13.0–17.0)
Immature Granulocytes: 1 %
LYMPHS ABS: 2.6 10*3/uL (ref 0.7–4.0)
Lymphocytes Relative: 43 %
MCH: 30 pg (ref 26.0–34.0)
MCHC: 32.6 g/dL (ref 30.0–36.0)
MCV: 92.2 fL (ref 80.0–100.0)
Monocytes Absolute: 0.5 10*3/uL (ref 0.1–1.0)
Monocytes Relative: 9 %
Neutro Abs: 2.6 10*3/uL (ref 1.7–7.7)
Neutrophils Relative %: 44 %
Platelet Count: 565 10*3/uL — ABNORMAL HIGH (ref 150–400)
RBC: 4.86 MIL/uL (ref 4.22–5.81)
RDW: 13.2 % (ref 11.5–15.5)
WBC Count: 5.9 10*3/uL (ref 4.0–10.5)
nRBC: 0 % (ref 0.0–0.2)

## 2018-10-25 LAB — CMP (CANCER CENTER ONLY)
ALT: 20 U/L (ref 0–44)
AST: 21 U/L (ref 15–41)
Albumin: 4.4 g/dL (ref 3.5–5.0)
Alkaline Phosphatase: 37 U/L — ABNORMAL LOW (ref 38–126)
Anion gap: 8 (ref 5–15)
BUN: 8 mg/dL (ref 6–20)
CO2: 29 mmol/L (ref 22–32)
Calcium: 8.9 mg/dL (ref 8.9–10.3)
Chloride: 104 mmol/L (ref 98–111)
Creatinine: 1.14 mg/dL (ref 0.61–1.24)
GFR, Est AFR Am: >60 mL/min (ref >60)
GFR, Estimated: >60 mL/min (ref >60)
Glucose, Bld: 110 mg/dL — ABNORMAL HIGH (ref 70–99)
Potassium: 4.3 mmol/L (ref 3.5–5.1)
Sodium: 141 mmol/L (ref 135–145)
Total Bilirubin: 0.6 mg/dL (ref 0.3–1.2)
Total Protein: 7 g/dL (ref 6.5–8.1)

## 2018-10-25 LAB — LACTATE DEHYDROGENASE: LDH: 178 U/L (ref 98–192)

## 2018-10-25 LAB — C-REACTIVE PROTEIN

## 2018-10-25 LAB — SAVE SMEAR(SSMR), FOR PROVIDER SLIDE REVIEW

## 2018-11-02 LAB — JAK2 (INCLUDING V617F AND EXON 12), MPL,& CALR W/RFL MPN PANEL (NGS)

## 2018-11-02 LAB — BCR ABL1 FISH (GENPATH)

## 2018-11-19 ENCOUNTER — Other Ambulatory Visit: Payer: Self-pay | Admitting: Medical

## 2018-12-27 ENCOUNTER — Inpatient Hospital Stay: Payer: BC Managed Care – PPO | Attending: Hematology & Oncology | Admitting: Hematology

## 2018-12-27 ENCOUNTER — Inpatient Hospital Stay: Payer: BC Managed Care – PPO

## 2019-01-12 ENCOUNTER — Other Ambulatory Visit: Payer: Self-pay

## 2019-01-12 ENCOUNTER — Encounter: Payer: Self-pay | Admitting: Medical

## 2019-01-12 ENCOUNTER — Ambulatory Visit (INDEPENDENT_AMBULATORY_CARE_PROVIDER_SITE_OTHER): Payer: BC Managed Care – PPO | Admitting: Medical

## 2019-01-12 DIAGNOSIS — H00014 Hordeolum externum left upper eyelid: Secondary | ICD-10-CM | POA: Diagnosis not present

## 2019-01-12 MED ORDER — TOBRAMYCIN 0.3 % OP SOLN: 2.0000 [drp] | Freq: Four times a day (QID) | OPHTHALMIC | 0 refills | Status: DC

## 2019-01-12 MED ORDER — FAMOTIDINE 20 MG PO TABS: ORAL_TABLET | ORAL | 0 refills | Status: DC

## 2019-01-12 MED ORDER — PANTOPRAZOLE SODIUM 40 MG PO TBEC: 40.0000 mg | DELAYED_RELEASE_TABLET | Freq: Two times a day (BID) | ORAL | 0 refills | Status: DC

## 2019-01-12 NOTE — Progress Notes (Signed)
Subjective:    Patient ID: William Fuller, male    DOB: 1970/12/11, 48 y.o.   MRN: [id number]  HPI Virtual Visit via Video Note  I connected with William Fuller on 01/12/19 at 11:00 AM EDT by a video enabled telemedicine application and verified that I am speaking with the correct person using two identifiers.  Location: Patient: home Provider: office  Pt did not check bp today. He may go to [employer] to check or buy machine then call back.   I discussed the limitations of evaluation and management by telemedicine and the availability of in person appointments. The patient expressed understanding and agreed to proceed.  History of Present Illness: Pt states left upper eye lid irritation for 2 days. No obvious trauma. Pt thinks he may have got dust in eye. He buffs floor at night. He wears glasses but does not have protective glasses. His eye is light sensitive. If not exposed to direct light does not have pain.    Observations/Objective: General-no acute distress, pleasant, oriented. Lungs- on inspection lungs appear unlabored. Neck- no tracheal deviation or jvd on inspection. Neuro- gross motor function appears intact. Left eye- upper lid moderate swollen and mild upper lid tender. Mild light sensitivity. No dc. conjuncitva clear. Assessment and Plan: Patient does by exam have possible stye but by history and my sensitivity some concern that he might have corneal abrasion secondary to buffing floors without very good eye protection.  Symptoms were worse this morning but now irritation has subsided.  Will prescribe Tobrex eyedrops and asked patient to start drops within an hour or so.  He can do warm compresses twice daily to his upper lid.  If he does not have incremental improvement by tomorrow morning asked patient to notify me so I could refer him to optometrist before the weekend.  I did inform him that optometrist typically do have shorter hours on Friday so update me early  around 9 AM.  He could also takes low-dose ibuprofen for pain but want him to check his blood pressure at [facility] or buy BP cuff at [facility]  Can take low-dose ibuprofen if blood pressures less than 140/90.  Follow-up appointment and 5days with myself or as needed.  I did explain to patient that if by 4 days he has complete/100% resolution of signs or symptoms then he can cancel visit  Follow Up Instructions:    I discussed the assessment and treatment plan with the patient. The patient was provided an opportunity to ask questions and all were answered. The patient agreed with the plan and demonstrated an understanding of the instructions.   The patient was advised to call back or seek an in-person evaluation if the symptoms worsen or if the condition fails to improve as anticipated.  I provided 15 minutes of non-face-to-face time during this encounter.   [name], PA-C    Review of Systems  Constitutional: Negative for chills, fatigue and fever.  Eyes: Positive for photophobia. Negative for discharge, redness and itching.       Only blurred vision when he gets very watery eye.  No spots in visual field or light flashes.  Not reporting direct orbit pressure.  Respiratory: Negative for cough, chest tightness, wheezing and stridor.   Cardiovascular: Negative for chest pain and palpitations.  Gastrointestinal: Negative for abdominal pain.  Skin: Negative for rash.  Neurological: Negative for dizziness, tremors, weakness and headaches.  Psychiatric/Behavioral: Negative for behavioral problems and confusion.    Past Medical History:  Diagnosis Date  . Allergy   . Focal nodular hyperplasia of liver   . GERD (gastroesophageal reflux disease)   . Hypertension   . Intestinal metaplasia of gastric mucosa      Social History   Socioeconomic History  . Marital status: Single    Spouse name: Not on file  . Number of children: Not on file  . Years of education: Not on file  .  Highest education level: Not on file  Occupational History  . Not on file  Social Needs  . Financial resource strain: Not on file  . Food insecurity    Worry: Not on file    Inability: Not on file  . Transportation needs    Medical: Not on file    Non-medical: Not on file  Tobacco Use  . Smoking status: Former Smoker    Types: Cigarettes    Quit date: 06/12/2015    Years since quitting: 3.5  . Smokeless tobacco: Never Used  Substance and Sexual Activity  . Alcohol use: No    Alcohol/week: 0.0 standard drinks  . Drug use: Yes    Types: Marijuana    Comment: pt smokes twice a week. Each time 2 joints.  . Sexual activity: Yes  Lifestyle  . Physical activity    Days per week: Not on file    Minutes per session: Not on file  . Stress: Not on file  Relationships  . Social Herbalist on phone: Not on file    Gets together: Not on file    Attends religious service: Not on file    Active member of club or organization: Not on file    Attends meetings of clubs or organizations: Not on file    Relationship status: Not on file  . Intimate partner violence    Fear of current or ex partner: Not on file    Emotionally abused: Not on file    Physically abused: Not on file    Forced sexual activity: Not on file  Other Topics Concern  . Not on file  Social History Narrative  . Not on file    Past Surgical History:  Procedure Laterality Date  . HERNIA REPAIR      Family History  Problem Relation Age of Onset  . Hypertension Other   . CAD Other   . Breast cancer Mother   . Heart disease Mother   . Heart disease Brother   . Heart disease Maternal Grandmother   . Hypertension Maternal Grandmother   . Colon cancer Neg Hx     No Known Allergies  Current Outpatient Medications on File Prior to Visit  Medication Sig Dispense Refill  . cyclobenzaprine (FLEXERIL) 10 MG tablet Take 1 tablet (10 mg total) by mouth at bedtime. 5 tablet 0  . fluticasone (FLONASE) 50  MCG/ACT nasal spray Place 2 sprays into both nostrils daily. 16 g 0  . ibuprofen (ADVIL,MOTRIN) 600 MG tablet Take 1 tablet (600 mg total) by mouth every 6 (six) hours as needed. 30 tablet 0  . losartan (COZAAR) 25 MG tablet Take 1 tablet (25 mg total) by mouth daily. 30 tablet 0  . ondansetron (ZOFRAN ODT) 8 MG disintegrating tablet 1/2- 1 tablet q 8 hr prn nausea, vomiting (Patient not taking: Reported on 10/25/2018) 20 tablet 0  . sertraline (ZOLOFT) 25 MG tablet Take 1 tablet (25 mg total) by mouth daily. 14 tablet 0  . SUMAtriptan (IMITREX) 50 MG tablet Take 1  tablet (50 mg total) by mouth every 2 (two) hours as needed for migraine. May repeat in 2 hours if headache persists or recurs.(max 2 tab in 24 hour period) 10 tablet 0  . triamcinolone cream (KENALOG) 0.1 % Apply 1 application topically 2 (two) times daily. 30 g 0   Current Facility-Administered Medications on File Prior to Visit  Medication Dose Route Frequency Provider Last Rate Last Dose  . 0.9 %  sodium chloride infusion  500 mL Intravenous Continuous Armbruster, Carlota Raspberry, MD        There were no vitals taken for this visit.      Objective:   Physical Exam        Assessment & Plan:

## 2019-01-12 NOTE — Patient Instructions (Signed)
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  Symptoms were worse this morning but now irritation has subsided.  Will prescribe Tobrex eyedrops and asked patient to start drops within an hour or so.  He can do warm compresses twice daily to his upper lid.  If he does not have incremental improvement by tomorrow morning asked patient to notify me so I could refer him to optometrist before the weekend.  I did inform him that optometrist typically do have shorter hours on Friday so update me early around 9 AM.  He could also takes low-dose ibuprofen for pain but want him to check his blood pressure at [facility] or buy BP cuff at [facility].  Can take low-dose ibuprofen if blood pressures less than 140/90.  Follow-up appointment and 5days with myself or as needed.  I did explain to patient that if by 4 days he has complete/100% resolution of signs or symptoms then he can cancel visit

## 2019-01-16 ENCOUNTER — Encounter (HOSPITAL_COMMUNITY): Payer: Self-pay

## 2019-01-16 ENCOUNTER — Other Ambulatory Visit: Payer: Self-pay

## 2019-01-16 ENCOUNTER — Telehealth: Payer: Self-pay | Admitting: Medical

## 2019-01-16 ENCOUNTER — Encounter: Payer: Self-pay | Admitting: Medical

## 2019-01-16 ENCOUNTER — Ambulatory Visit (HOSPITAL_COMMUNITY)
Admission: EM | Admit: 2019-01-16 | Discharge: 2019-01-16 | Disposition: A | Discharge status: Home / Self Care | Payer: BC Managed Care – PPO | Attending: Family Medicine | Admitting: Family Medicine

## 2019-01-16 DIAGNOSIS — S0502XA Injury of conjunctiva and corneal abrasion without foreign body, left eye, initial encounter: Secondary | ICD-10-CM

## 2019-01-16 MED ORDER — ERYTHROMYCIN 5 MG/GM OP OINT: TOPICAL_OINTMENT | OPHTHALMIC | 0 refills | Status: DC

## 2019-01-16 MED ORDER — FLUORESCEIN SODIUM 1 MG OP STRP
ORAL_STRIP | OPHTHALMIC | Status: AC
  Filled 2019-01-16: qty 1

## 2019-01-16 MED ORDER — TETRACAINE HCL 0.5 % OP SOLN
OPHTHALMIC | Status: AC
  Filled 2019-01-16: qty 4

## 2019-01-16 MED ORDER — LUBRICANT EYE DROPS 0.4-0.3 % OP SOLN: 1.0000 [drp] | Freq: Three times a day (TID) | OPHTHALMIC | 0 refills | Status: DC | PRN

## 2019-01-16 NOTE — ED Triage Notes (Signed)
Patient presents to Urgent Care with complaints of left eye pain since last week. Patient reports he did an e-visit with his PCP, who prescribed some eye drops but they only help for approx an hour. Pt reports no vision changes.

## 2019-01-16 NOTE — ED Provider Notes (Signed)
[place]    CSN: [id number] Arrival date & time: 01/16/19  1122     History   Chief Complaint Chief Complaint  Patient presents with  . Eye Pain    HPI William Fuller is a 48 y.o. male with history of hypertension presenting for acute concern of left eye pain and redness.  Patient states that he has had this for most 2 weeks now.  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  Patient does not routinely wear eye protection when performing his duties.  Patient was evaluated through ED visit by his PCP on 6/11: Treated for holding all a.m. and instructed to use tobramycin eyedrops in conjunction with hot compresses.  Patient states use hot compresses for 2 nights and arrival with minimal relief of pain.  Has been compliant with tobramycin drops as well.  Patient still endorsing eye redness, pain, photophobia.  Patient denies change in vision, visual disturbances, pain with extraocular movements, headache, temporal pain, ear or jaw pain, fever.  States his PCP advised that he be evaluated to see if "I have a scrape in my eye".  Patient states that he does have a regular eye doctor, though has not schedule an appointment with them yet.    Past Medical History:  Diagnosis Date  . Allergy   . Focal nodular hyperplasia of liver   . GERD (gastroesophageal reflux disease)   . Hypertension   . Intestinal metaplasia of gastric mucosa     Patient Active Problem List   Diagnosis Date Noted  . Thrombocytosis ([place]) 10/21/2018  . Bilateral shoulder pain 09/15/2017    Past Surgical History:  Procedure Laterality Date  . HERNIA REPAIR         Home Medications    Prior to Admission medications   Medication Sig Start Date End Date Taking? Authorizing Provider  cyclobenzaprine (FLEXERIL) 10 MG tablet Take 1 tablet (10 mg total) by mouth at bedtime. 10/03/18   Saguier, Percell Miller, PA-C  erythromycin ophthalmic ointment Place a 1/2  inch ribbon of ointment into the lower eyelid. 01/16/19   Hall-Potvin, [place], PA-C  famotidine (PEPCID) 20 MG tablet TAKE 1 TO 2 TABLETS BY MOUTH ONCE DAILY 01/12/19   Saguier, Percell Miller, PA-C  fluticasone [facility]) 50 MCG/ACT nasal spray Place 2 sprays into both nostrils daily. 09/27/18   Melynda Ripple, MD  ibuprofen (ADVIL,MOTRIN) 600 MG tablet Take 1 tablet (600 mg total) by mouth every 6 (six) hours as needed. 09/27/18   Melynda Ripple, MD  losartan (COZAAR) 25 MG tablet Take 1 tablet (25 mg total) by mouth daily. 08/17/18   Saguier, Percell Miller, PA-C  ondansetron (ZOFRAN ODT) 8 MG disintegrating tablet 1/2- 1 tablet q 8 hr prn nausea, vomiting Patient not taking: Reported on 10/25/2018 10/07/18   Saguier, Percell Miller, PA-C  pantoprazole (PROTONIX) 40 MG tablet Take 1 tablet (40 mg total) by mouth 2 (two) times daily. 01/12/19   Saguier, Percell Miller, PA-C  Polyethyl Glycol-Propyl Glycol (LUBRICANT EYE DROPS) 0.4-0.3 % SOLN Apply 1 drop to eye 3 (three) times daily as needed. 01/16/19   Hall-Potvin, [place], PA-C  sertraline (ZOLOFT) 25 MG tablet Take 1 tablet (25 mg total) by mouth daily. 08/17/18   Saguier, Percell Miller, PA-C  SUMAtriptan (IMITREX) 50 MG tablet Take 1 tablet (50 mg total) by mouth every 2 (two) hours as needed for migraine. May repeat in 2 hours if headache persists or recurs.(max 2 tab in 24 hour period) 08/12/18  Saguier, Percell Miller, PA-C  tobramycin (TOBREX) 0.3 % ophthalmic solution Place 2 drops into the left eye every 6 (six) hours. 01/12/19   Saguier, Percell Miller, PA-C  triamcinolone cream (KENALOG) 0.1 % Apply 1 application topically 2 (two) times daily. 05/01/18   Jaynee Eagles, PA-C    Family History Family History  Problem Relation Age of Onset  . Hypertension Other   . CAD Other   . Breast cancer Mother   . Heart disease Mother   . Heart disease Brother   . Heart disease Maternal Grandmother   . Hypertension Maternal Grandmother   . Healthy Father   . Colon cancer Neg Hx     Social History  Social History   Tobacco Use  . Smoking status: Former Smoker    Types: Cigarettes    Quit date: 06/12/2015    Years since quitting: 3.6  . Smokeless tobacco: Never Used  Substance Use Topics  . Alcohol use: No    Alcohol/week: 0.0 standard drinks  . Drug use: Yes    Types: Marijuana    Comment: pt smokes twice a week. Each time 2 joints.     Allergies   Patient has no known allergies.   Review of Systems As per HPI   Physical Exam Triage Vital Signs ED Triage Vitals  Enc Vitals Group     BP 01/16/19 1156 (!) 148/115     Pulse Rate 01/16/19 1156 61     Resp 01/16/19 1156 18     Temp 01/16/19 1156 98.9 F (37.2 C)     Temp Source 01/16/19 1156 Oral     SpO2 01/16/19 1156 100 %     Weight --      Height --      Head Circumference --      Peak Flow --      Pain Score 01/16/19 1154 0     Pain Loc --      Pain Edu? --      Excl. in [place]? --    No data found.  Updated Vital Signs BP (!) 148/115 (BP Location: Right Arm) Comment: has not taken bp meds yet today  Pulse 61   Temp 98.9 F (37.2 C) (Oral)   Resp 18   SpO2 100%   Visual Acuity Right Eye Distance:   Left Eye Distance:   Bilateral Distance:    Right Eye Near:   Left Eye Near:    Bilateral Near:     Physical Exam Constitutional:      General: He is not in acute distress. HENT:     Head: Normocephalic and atraumatic.     Right Ear: Tympanic membrane, ear canal and external ear normal.     Left Ear: Tympanic membrane, ear canal and external ear normal.     Nose: Nose normal. No congestion or rhinorrhea.     Mouth/Throat:     Mouth: Mucous membranes are moist.     Pharynx: Oropharynx is clear. No oropharyngeal exudate or posterior oropharyngeal erythema.  Eyes:     General: No scleral icterus.       Right eye: No discharge.        Left eye: No discharge.     Extraocular Movements: Extraocular movements intact.     Pupils: Pupils are equal, round, and reactive to light.     Comments: Left  eye with mild upper and lower lid edema that is minimally tender to palpation and without warmth or erythema.  Conjunctiva moderately  edematous with injection that does not spare the limbus.  EOMs are non-tender. Fluorescein dye significant for uptake of a 4 x 7 mm inferolateral lesion to pupil consistent with abrasion.  No foreign body identified, laceration, blood/discharge.  Patient reports pain improvement with tetracaine drops.  Cardiovascular:     Rate and Rhythm: Normal rate.  Pulmonary:     Effort: Pulmonary effort is normal.  Skin:    Coloration: Skin is not jaundiced or pale.  Neurological:     Mental Status: He is alert and oriented to person, place, and time.      UC Treatments / Results  Labs (all labs ordered are listed, but only abnormal results are displayed) Labs Reviewed - No data to display  EKG None  Radiology No results found.  Procedures Procedures (including critical care time)  Medications Ordered in UC Medications  tetracaine (PONTOCAINE) 0.5 % ophthalmic solution (has no administration in time range)  fluorescein 1 MG ophthalmic strip (has no administration in time range)  fluorescein 1 MG ophthalmic strip (has no administration in time range)    Initial Impression / Assessment and Plan / UC Course  I have reviewed the triage vital signs and the nursing notes.  Pertinent labs & imaging results that were available during my care of the patient were reviewed by me and considered in my medical decision making (see chart for details).     48 year old male with history of hypertension presenting for acute concern of left eye pain and swelling.  Fluorescein uptake noted on inferolateral aspect of orbit.  Patient to try erythromycin eye ointment and conjectural lubricant eyedrops.  Patient to call his ophthalmologist later today to schedule follow-up appointment within the week or sooner if change in vision occurs.  Discussed return precautions, patient  verbalized understanding.  Of note, patient hypertensive in office today.  Patient states he has not yet taken his hypertension medications today.  Patient denies headache, chest pain, shortness of breath, abdominal pain lower extreme edema. Final Clinical Impressions(s) / UC Diagnoses   Final diagnoses:  Abrasion of left cornea, initial encounter     Discharge Instructions     Keep area clean and dry Use antibiotic ointment nightly Use eye drops as needed for additional pain relief    ED Prescriptions    Medication Sig Dispense Auth. Provider   Polyethyl Glycol-Propyl Glycol (LUBRICANT EYE DROPS) 0.4-0.3 % SOLN Apply 1 drop to eye 3 (three) times daily as needed. 10 mL Hall-Potvin, [place], PA-C   erythromycin ophthalmic ointment Place a 1/2 inch ribbon of ointment into the lower eyelid. 3.5 g Hall-Potvin, [place], PA-C     Controlled Substance Prescriptions [place] Controlled Substance Registry consulted? Not Applicable   Quincy Sheehan, [place] 01/16/19 1424

## 2019-01-16 NOTE — Telephone Encounter (Signed)
Pt had called [facility] wanting to move up his appt to today.  Called pt to get him in to see Dr Etter Sjogren this afternoon since Edward's schedule was full.  Pt stated he is at an UC and he will keep his f/u appt with Percell Miller that is scheduled for Wednesday of this week.

## 2019-01-16 NOTE — Discharge Instructions (Addendum)
Keep area clean and dry Use antibiotic ointment nightly Use eye drops as needed for additional pain relief

## 2019-01-19 ENCOUNTER — Ambulatory Visit: Payer: BC Managed Care – PPO | Admitting: Medical

## 2019-01-25 ENCOUNTER — Encounter: Payer: Self-pay | Admitting: Internal Medicine

## 2019-01-25 ENCOUNTER — Ambulatory Visit: Payer: BC Managed Care – PPO | Admitting: Internal Medicine

## 2019-01-25 ENCOUNTER — Other Ambulatory Visit: Payer: Self-pay

## 2019-01-25 VITALS — BP 118/71 | HR 66 | Temp 98.2°F | Resp 16 | Ht 70.0 in | Wt 249.4 lb

## 2019-01-25 DIAGNOSIS — S0502XD Injury of conjunctiva and corneal abrasion without foreign body, left eye, subsequent encounter: Secondary | ICD-10-CM

## 2019-01-25 NOTE — Progress Notes (Signed)
Pre visit review using our clinic review tool, if applicable. No additional management support is needed unless otherwise documented below in the visit note. 

## 2019-01-25 NOTE — Progress Notes (Signed)
Subjective:    Patient ID: William Fuller, male    DOB: November 25, 1970, 48 y.o.   MRN: [id number]  DOS:  01/25/2019 Type of visit - description: Acute visit Approximately 3 weeks ago, developed left eye pain and eye sensitivity. Subsequently went to urgent care and was prescribed eyedrops. He did not improve, went to see ophthalmologist 01/17/2019. I reviewed the note, dx was corneal abrasion, was prescribed a additional antibiotic, was told that he was healing already.  Review of Systems He is here today because he continue with symptoms: On and off sensitivity to the light, worse in the morning On and off pain, at times severe Continue with red eye, no actual discharge  No floaters.   Past Medical History:  Diagnosis Date  . Allergy   . Focal nodular hyperplasia of liver   . GERD (gastroesophageal reflux disease)   . Hypertension   . Intestinal metaplasia of gastric mucosa     Past Surgical History:  Procedure Laterality Date  . HERNIA REPAIR      Social History   Socioeconomic History  . Marital status: Single    Spouse name: Not on file  . Number of children: Not on file  . Years of education: Not on file  . Highest education level: Not on file  Occupational History  . Not on file  Social Needs  . Financial resource strain: Not on file  . Food insecurity    Worry: Not on file    Inability: Not on file  . Transportation needs    Medical: Not on file    Non-medical: Not on file  Tobacco Use  . Smoking status: Former Smoker    Types: Cigarettes    Quit date: 06/12/2015    Years since quitting: 3.6  . Smokeless tobacco: Never Used  Substance and Sexual Activity  . Alcohol use: No    Alcohol/week: 0.0 standard drinks  . Drug use: Yes    Types: Marijuana    Comment: pt smokes twice a week. Each time 2 joints.  . Sexual activity: Yes  Lifestyle  . Physical activity    Days per week: Not on file    Minutes per session: Not on file  . Stress: Not on file   Relationships  . Social Herbalist on phone: Not on file    Gets together: Not on file    Attends religious service: Not on file    Active member of club or organization: Not on file    Attends meetings of clubs or organizations: Not on file    Relationship status: Not on file  . Intimate partner violence    Fear of current or ex partner: Not on file    Emotionally abused: Not on file    Physically abused: Not on file    Forced sexual activity: Not on file  Other Topics Concern  . Not on file  Social History Narrative  . Not on file      Allergies as of 01/25/2019   No Known Allergies     Medication List       Accurate as of January 25, 2019  3:08 PM. If you have any questions, ask your nurse or doctor.        cyclobenzaprine 10 MG tablet Commonly known as: FLEXERIL Take 1 tablet (10 mg total) by mouth at bedtime.   erythromycin ophthalmic ointment Place a 1/2 inch ribbon of ointment into the lower eyelid.   famotidine  20 MG tablet Commonly known as: PEPCID TAKE 1 TO 2 TABLETS BY MOUTH ONCE DAILY   fluticasone 50 MCG/ACT nasal spray Commonly known as: FLONASE Place 2 sprays into both nostrils daily.   ibuprofen 600 MG tablet Commonly known as: ADVIL Take 1 tablet (600 mg total) by mouth every 6 (six) hours as needed.   losartan 25 MG tablet Commonly known as: COZAAR Take 1 tablet (25 mg total) by mouth daily.   Lubricant Eye Drops 0.4-0.3 % Soln Generic drug: Polyethyl Glycol-Propyl Glycol Apply 1 drop to eye 3 (three) times daily as needed.   ondansetron 8 MG disintegrating tablet Commonly known as: Zofran ODT 1/2- 1 tablet q 8 hr prn nausea, vomiting   pantoprazole 40 MG tablet Commonly known as: PROTONIX Take 1 tablet (40 mg total) by mouth 2 (two) times daily.   sertraline 25 MG tablet Commonly known as: ZOLOFT Take 1 tablet (25 mg total) by mouth daily.   SUMAtriptan 50 MG tablet Commonly known as: Imitrex Take 1 tablet (50 mg total)  by mouth every 2 (two) hours as needed for migraine. May repeat in 2 hours if headache persists or recurs.(max 2 tab in 24 hour period)   tobramycin 0.3 % ophthalmic solution Commonly known as: Tobrex Place 2 drops into the left eye every 6 (six) hours.   triamcinolone cream 0.1 % Commonly known as: KENALOG Apply 1 application topically 2 (two) times daily.           Objective:   Physical Exam BP 118/71 (BP Location: Left Arm, Patient Position: Sitting, Cuff Size: Normal)   Pulse 66   Temp 98.2 F (36.8 C) (Oral)   Resp 16   Ht 5\' 10"  (1.778 m)   Wt 249 lb 6 oz (113.1 kg)   SpO2 100%   BMI 35.78 kg/m  General:   Well developed, NAD, BMI noted. HEENT:  Normocephalic . Face symmetric, atraumatic Right eye: Conjunctiva normal, anterior chamber normal, pupils small but reactive to light Left eye: Red conjunctiva, anterior chamber symmetric in color and aspect compared to the right, pupil is smaller but reactive to light. EOMI. On dark light/dye eye exam: No obvious FB or abrasion. No periorbital swelling  Neurology: Alert & oriented X3.  Speech normal, gait appropriate for age and unassisted Psych--  Cognition and judgment appear intact.  Cooperative with normal attention span and concentration.  Behavior appropriate. No anxious or depressed appearing.      Assessment    Corneal abrasion Patient has not improved, after 3 weeks. On exam there is no obvious anterior chamber abnormality and  both pupils are small. Contacted Dr. Gershon Crane, request a prompt reevaluation, spoke with the technician there, will be seen tomorrow at 8 AM

## 2019-01-25 NOTE — Patient Instructions (Signed)
Please go to Dr. Gershon Crane office tomorrow at 8 AM located at [facility] [address]   If you feel a lot worse tonight, go to the ER

## 2019-01-26 ENCOUNTER — Telehealth: Payer: Self-pay | Admitting: Internal Medicine

## 2019-01-26 NOTE — Telephone Encounter (Signed)
Please call the patient, was he able to see ophthalmology, is he doing better? Let me know

## 2019-01-27 NOTE — Telephone Encounter (Signed)
Yes- saw Dr. Gershon Crane yesterday- see notes in [place]- he is to f/u with Dr. Gershon Crane in 4 days.

## 2019-01-27 NOTE — Telephone Encounter (Signed)
thx Dx : keratitis

## 2019-02-08 ENCOUNTER — Other Ambulatory Visit: Payer: Self-pay

## 2019-02-09 ENCOUNTER — Ambulatory Visit: Payer: BC Managed Care – PPO | Admitting: Medical

## 2019-02-09 ENCOUNTER — Telehealth: Payer: Self-pay | Admitting: *Deleted

## 2019-02-09 ENCOUNTER — Encounter: Payer: Self-pay | Admitting: Medical

## 2019-02-09 ENCOUNTER — Telehealth: Payer: Self-pay | Admitting: Medical

## 2019-02-09 VITALS — BP 137/80 | HR 93 | Temp 98.6°F | Resp 16 | Ht 70.0 in | Wt 252.8 lb

## 2019-02-09 DIAGNOSIS — F419 Anxiety disorder, unspecified: Secondary | ICD-10-CM | POA: Diagnosis not present

## 2019-02-09 DIAGNOSIS — Z20828 Contact with and (suspected) exposure to other viral communicable diseases: Secondary | ICD-10-CM | POA: Diagnosis not present

## 2019-02-09 DIAGNOSIS — F329 Major depressive disorder, single episode, unspecified: Secondary | ICD-10-CM | POA: Diagnosis not present

## 2019-02-09 DIAGNOSIS — Z20822 Contact with and (suspected) exposure to covid-19: Secondary | ICD-10-CM

## 2019-02-09 DIAGNOSIS — F32A Depression, unspecified: Secondary | ICD-10-CM

## 2019-02-09 NOTE — Progress Notes (Signed)
Subjective:    Patient ID: William Fuller, male    DOB: [birth year], 48 y.o.   MRN: [id number]  HPI  Pt in for follow up on depression. He states with pandemic his mood is worsening and stress of rioting situation. Pt does not watch news since that depresses him. Pt states no family of friends sick. He works at [employer]. 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  Pt still working full time. Students not on campus yet. Pt also works cleaning/buffing floors.  Pt states every day is bad for his mood. He states some trouble sleeping. Because of his second job. Some days he works 16 hours a day. He will get home sometimes 2:30 am. Next day will wake up at 7 am. He states sometimes 2 hours before he can fall asleep. So some days falls asleep at 4:40.    Review of Systems  Constitutional: Negative for chills, fatigue and fever.  HENT: Negative for congestion.        No sense of smell change.  Respiratory: Negative for cough, shortness of breath and wheezing.   Cardiovascular: Negative for chest pain.  Gastrointestinal: Negative for diarrhea.  Musculoskeletal: Negative for back pain, myalgias and neck stiffness.  Skin: Negative for rash.  Neurological: Negative for dizziness and headaches.  Hematological: Negative for adenopathy. Does not bruise/bleed easily.  Psychiatric/Behavioral: Positive for dysphoric mood and sleep disturbance. Negative for behavioral problems, confusion, self-injury and suicidal ideas.       Pt states he almost got fired loosing his temper with boss.     Past Medical History:  Diagnosis Date  . Allergy   . Focal nodular hyperplasia of liver   . GERD (gastroesophageal reflux disease)   . Hypertension   . Intestinal metaplasia of gastric mucosa      Social History   Socioeconomic History  . Marital status: Single    Spouse name: Not on file  . Number of children: Not on file  . Years of education: Not on  file  . Highest education level: Not on file  Occupational History  . Not on file  Social Needs  . Financial resource strain: Not on file  . Food insecurity    Worry: Not on file    Inability: Not on file  . Transportation needs    Medical: Not on file    Non-medical: Not on file  Tobacco Use  . Smoking status: Former Smoker    Types: Cigarettes    Quit date: 06/12/2015    Years since quitting: 3.6  . Smokeless tobacco: Never Used  Substance and Sexual Activity  . Alcohol use: No    Alcohol/week: 0.0 standard drinks  . Drug use: Yes    Types: Marijuana    Comment: pt smokes twice a week. Each time 2 joints.  . Sexual activity: Yes  Lifestyle  . Physical activity    Days per week: Not on file    Minutes per session: Not on file  . Stress: Not on file  Relationships  . Social Herbalist on phone: Not on file    Gets together: Not on file    Attends religious service: Not on file    Active member of club or organization: Not on file    Attends meetings of clubs or organizations: Not on file    Relationship status: Not on file  . Intimate partner violence  Fear of current or ex partner: Not on file    Emotionally abused: Not on file    Physically abused: Not on file    Forced sexual activity: Not on file  Other Topics Concern  . Not on file  Social History Narrative  . Not on file    Past Surgical History:  Procedure Laterality Date  . HERNIA REPAIR      Family History  Problem Relation Age of Onset  . Hypertension Other   . CAD Other   . Breast cancer Mother   . Heart disease Mother   . Heart disease Brother   . Heart disease Maternal Grandmother   . Hypertension Maternal Grandmother   . Healthy Father   . Colon cancer Neg Hx     No Known Allergies  Current Outpatient Medications on File Prior to Visit  Medication Sig Dispense Refill  . cyclobenzaprine (FLEXERIL) 10 MG tablet Take 1 tablet (10 mg total) by mouth at bedtime. 5 tablet 0  .  erythromycin ophthalmic ointment Place a 1/2 inch ribbon of ointment into the lower eyelid. 3.5 g 0  . famotidine (PEPCID) 20 MG tablet TAKE 1 TO 2 TABLETS BY MOUTH ONCE DAILY 60 tablet 0  . fluticasone (FLONASE) 50 MCG/ACT nasal spray Place 2 sprays into both nostrils daily. 16 g 0  . ibuprofen (ADVIL,MOTRIN) 600 MG tablet Take 1 tablet (600 mg total) by mouth every 6 (six) hours as needed. 30 tablet 0  . losartan (COZAAR) 25 MG tablet Take 1 tablet (25 mg total) by mouth daily. 30 tablet 0  . ondansetron (ZOFRAN ODT) 8 MG disintegrating tablet 1/2- 1 tablet q 8 hr prn nausea, vomiting 20 tablet 0  . pantoprazole (PROTONIX) 40 MG tablet Take 1 tablet (40 mg total) by mouth 2 (two) times daily. 120 tablet 0  . Polyethyl Glycol-Propyl Glycol (LUBRICANT EYE DROPS) 0.4-0.3 % SOLN Apply 1 drop to eye 3 (three) times daily as needed. 10 mL 0  . sertraline (ZOLOFT) 25 MG tablet Take 1 tablet (25 mg total) by mouth daily. 14 tablet 0  . SUMAtriptan (IMITREX) 50 MG tablet Take 1 tablet (50 mg total) by mouth every 2 (two) hours as needed for migraine. May repeat in 2 hours if headache persists or recurs.(max 2 tab in 24 hour period) 10 tablet 0  . tobramycin (TOBREX) 0.3 % ophthalmic solution Place 2 drops into the left eye every 6 (six) hours. 5 mL 0  . triamcinolone cream (KENALOG) 0.1 % Apply 1 application topically 2 (two) times daily. 30 g 0   No current facility-administered medications on file prior to visit.     BP (!) 144/98   Pulse 93   Temp 98.6 F (37 C) (Oral)   Resp 16   Ht 5\' 10"  (1.778 m)   Wt 252 lb 12.8 oz (114.7 kg)   SpO2 100%   BMI 36.27 kg/m       Objective:   Physical Exam  General Mental Status- Alert. General Appearance- Not in acute distress.   Skin General: Color- Normal Color. Moisture- Normal Moisture.  Neck Carotid Arteries- Normal color. Moisture- Normal Moisture. No carotid bruits. No JVD.  Chest and Lung Exam Auscultation: Breath Sounds:-Normal.   Cardiovascular Auscultation:Rythm- Regular. Murmurs & Other Heart Sounds:Auscultation of the heart reveals- No Murmurs.  Abdomen Inspection:-Inspeection Normal. Palpation/Percussion:Note:No mass. Palpation and Percussion of the abdomen reveal- Non Tender, Non Distended + BS, no rebound or guarding.   Neurologic Cranial Nerve exam:- CN  III-XII intact(No nystagmus), symmetric smile. Strength:- 5/5 equal and symmetric strength both upper and lower extremities.  HEENT- no frontal or maxillary sinus pressure.      Assessment & Plan:  For depression and anxiety which has worsened recently with COVID situation and the right, I want to increase her sertraline to 50 mg daily.  Would recommend eating healthy and trying to get some daily exercise as this might help her mood as well.  In addition you have very challenging work schedule at times which limits your amount of sleep.  Getting adequate rest is important for immune system as well as good mental status/mental health.  I am going to make trazodone available to use for starters on nights when you think you be able to get full 8 hours of sleep.  You mention other day you felt anxious and frustrated then lost her temper with your boss.  You had concerns about losing her job after that event so I do think is beneficial for you to have clonazepam to use as needed basis.  Rx advisement given.  A limited number of tablets given today.  You have concern about COVID but have not had any direct close exposure to no no person.  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  We will contact the [place] and get them to call you/schedule you for testing.  If you have any of the signs/symptoms we discussed today then notify me and in that event I would need to give you work note/excuse.  This is very important.  Patient expressed understanding.  Follow-up virtual visit in 2 weeks or as needed.  25+ minutes spent with  patient today.  Counseled on treatment for his depression, anxiety and insomnia.  Also counseled on COVID testing and importance of him notifying me in the event he gets any signs and symptoms as we discussed.  [name], PA-C

## 2019-02-09 NOTE — Telephone Encounter (Signed)
See telephone encounter on 02/09/19. Left message for pt to return call to schedule testing.

## 2019-02-09 NOTE — Patient Instructions (Addendum)
For depression and anxiety which has worsened recently with COVID situation and the right, I want to increase her sertraline to 50 mg daily.  Would recommend eating healthy and trying to get some daily exercise as this might help her mood as well.  In addition you have very challenging work schedule at times which limits your amount of sleep.  Getting adequate rest is important for immune system as well as good mental status/mental health.  I am going to make trazodone available to use for starters on nights when you think you be able to get full 8 hours of sleep.  You mention other day you felt anxious and frustrated then lost her temper with your boss.  You had concerns about losing her job after that event so I do think is beneficial for you to have clonazepam to use as needed basis.  Rx advisement given.  A limited number of tablets given today.  You have concern about COVID but have not had any direct close exposure to no no person.  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  We will contact the [place] and get them to call you/schedule you for testing.  If you have any of the signs/symptoms we discussed today then notify me and in that event I would need to give you work note/excuse.  This is very important.  Patient expressed understanding.  Blood pressure was better on second check.  Continue current blood pressure medicine.  Follow-up virtual visit in 2 weeks or as needed.

## 2019-02-09 NOTE — Telephone Encounter (Signed)
Patient is requesting COVID test to be done.  No signs or symptoms reported but he does work at [employer] and [employer] workers tested positive for [employer] recently.  Please get patient scheduled tomorrow.

## 2019-02-09 NOTE — Telephone Encounter (Signed)
Saguier, Percell Miller, PA-C      Patient is requesting COVID test to be done.  No signs or symptoms reported but he does work at [employer] and [employer] workers tested positive for [employer] recently.  Please get patient scheduled tomorrow.         Pt called and left message on voicemail to return call to schedule testing. Order placed.

## 2019-02-10 ENCOUNTER — Telehealth: Payer: Self-pay | Admitting: Medical

## 2019-02-10 MED ORDER — CLONAZEPAM 0.5 MG PO TABS: 0.5000 mg | ORAL_TABLET | Freq: Two times a day (BID) | ORAL | 0 refills | Status: DC | PRN

## 2019-02-10 NOTE — Telephone Encounter (Signed)
Attempted to reach pt, no answer. Left vm to call back  

## 2019-02-10 NOTE — Telephone Encounter (Signed)
Pt called in and stated that he was under the impression that William Fuller was going to send in meds for his anxiety.  He stated that the pharmacy does not have anything.  I do see on the AVS that he was going to call something in for him or suggested a medication.  Please advise

## 2019-02-10 NOTE — Telephone Encounter (Signed)
Pt called back , sch pt for GV 7/13 at 9:15

## 2019-02-10 NOTE — Telephone Encounter (Signed)
Please advise 

## 2019-02-10 NOTE — Addendum Note (Signed)
Addended by: Anabel Halon on: 02/10/2019 04:24 PM   Modules accepted: Orders

## 2019-02-10 NOTE — Telephone Encounter (Signed)
I sent in rx clonazapem on Friday afternoon.

## 2019-02-13 ENCOUNTER — Other Ambulatory Visit: Payer: BC Managed Care – PPO

## 2019-02-13 DIAGNOSIS — Z20822 Contact with and (suspected) exposure to covid-19: Secondary | ICD-10-CM

## 2019-02-17 LAB — NOVEL CORONAVIRUS, NAA: SARS-CoV-2, NAA: NOT DETECTED

## 2019-02-24 ENCOUNTER — Encounter: Payer: Self-pay | Admitting: Medical

## 2019-02-24 ENCOUNTER — Ambulatory Visit (INDEPENDENT_AMBULATORY_CARE_PROVIDER_SITE_OTHER): Payer: BC Managed Care – PPO | Admitting: Medical

## 2019-02-24 ENCOUNTER — Other Ambulatory Visit: Payer: Self-pay

## 2019-02-24 DIAGNOSIS — F329 Major depressive disorder, single episode, unspecified: Secondary | ICD-10-CM

## 2019-02-24 DIAGNOSIS — F32A Depression, unspecified: Secondary | ICD-10-CM

## 2019-02-24 MED ORDER — SERTRALINE HCL 25 MG PO TABS: 25.0000 mg | ORAL_TABLET | Freq: Every day | ORAL | 3 refills | Status: DC

## 2019-02-24 NOTE — Progress Notes (Signed)
Subjective:    Patient ID: William Fuller, male    DOB: 03-03-1971, 48 y.o.   MRN: [id number]  HPI  Virtual Visit via Telephone Note  I connected with William Fuller on 02/24/19 at  1:00 PM EDT by telephone and verified that I am speaking with the correct person using two identifiers.  Location: Patient: car Provider: office.   I discussed the limitations, risks, security and privacy concerns of performing an evaluation and management service by telephone and the availability of in person appointments. I also discussed with the patient that there may be a patient responsible charge related to this service. The patient expressed understanding and agreed to proceed.   History of Present Illness:  Pt states his mood has not changed much since her last visit(minimal improvement as he is purposely trying to be more relaxed.).  Still reporting some moderate daily depression.  He states he still on the sertraline 25 mg dose.  Never increased to 50 mg.  He had tablets to double but never made that change.  He tried trazodone for 2 nights to help him sleep but states it did not help.  He has not had to use any clonazepam for any severe anxiety event or losing his temper.  He does still have those available.  Patient came to the conclusion that he has a lack of sleep and working a second job is probably affecting his mood.  I had discussed that very few hours of sleep would make an impact on his mood negatively.  He states that he thinks that is likely the case and has plans to quit his second job sometime in the near future.   Observations/Objective: General- no acute distress, pleasant, oriented, normal speech.  Assessment and Plan: Patient will continue sertraline 25mg  daily for his depression.  He has clonazepam available if needed for severe anxiety events.  He reports trazodone did not help him sleep and he did not want to increase the sertraline.  He is now with opinion that the lack of  sleep due to second job is probably affecting his mood negatively.  He has plans to quit his second job in the near future.  Asked him to continue current medication regimen but discontinue trazodone.  If he does quit second job and is getting full 8-hour sleep but still has decreased mood or still very anxious then notify me.  If any severe mood changes such as thoughts of harm to self or others then would recommend emergency department evaluation.  I have explained this to patient in the past various times.  Follow-up in 1 month or as needed.  Follow Up Instructions:    I discussed the assessment and treatment plan with the patient. The patient was provided an opportunity to ask questions and all were answered. The patient agreed with the plan and demonstrated an understanding of the instructions.   The patient was advised to call back or seek an in-person evaluation if the symptoms worsen or if the condition fails to improve as anticipated.  I provided 10 minutes of non-face-to-face time during this encounter.   [name], PA-C    Review of Systems  Constitutional: Negative for chills and fatigue.  Respiratory: Negative for cough, chest tightness, shortness of breath and wheezing.   Cardiovascular: Negative for chest pain and palpitations.  Gastrointestinal: Negative for abdominal pain.  Skin: Negative for rash.  Psychiatric/Behavioral: Positive for dysphoric mood and sleep disturbance. Negative for behavioral problems, self-injury and suicidal ideas.  The patient is nervous/anxious.        Similar as before per patient.  Though he states recently a little bit more relaxed and are trying to take it easy.  See HPI.       Objective:   Physical Exam        Assessment & Plan:

## 2019-02-24 NOTE — Patient Instructions (Signed)
Patient will continue sertraline 25mg  daily for his depression.  He has clonazepam available if needed for severe anxiety events.  He reports trazodone did not help him sleep and he did not want to increase the sertraline.  He is now with opinion that the lack of sleep due to second job is probably affecting his mood negatively.  He has plans to quit his second job in the near future.  Asked him to continue current medication regimen but discontinue trazodone.  If he does quit second job and is getting full 8-hour sleep but still has decreased mood or still very anxious then notify me.  If any severe mood changes such as thoughts of harm to self or others then would recommend emergency department evaluation.  I have explained this to patient in the past various times.  Follow-up in 1 month or as needed.

## 2019-03-21 ENCOUNTER — Encounter: Payer: Self-pay | Admitting: Gastroenterology

## 2019-04-13 ENCOUNTER — Other Ambulatory Visit: Payer: Self-pay

## 2019-04-14 ENCOUNTER — Encounter: Payer: Self-pay | Admitting: Medical

## 2019-04-14 ENCOUNTER — Ambulatory Visit: Payer: BC Managed Care – PPO | Admitting: Medical

## 2019-04-14 ENCOUNTER — Other Ambulatory Visit (HOSPITAL_COMMUNITY)
Admission: RE | Admit: 2019-04-14 | Discharge: 2019-04-14 | Disposition: A | Discharge status: Home / Self Care | Payer: BC Managed Care – PPO | Source: Ambulatory Visit | Attending: Medical | Admitting: Medical

## 2019-04-14 VITALS — BP 131/75 | HR 65 | Temp 97.2°F | Resp 16 | Ht 70.0 in | Wt 244.0 lb

## 2019-04-14 DIAGNOSIS — N419 Inflammatory disease of prostate, unspecified: Secondary | ICD-10-CM

## 2019-04-14 DIAGNOSIS — Z23 Encounter for immunization: Secondary | ICD-10-CM | POA: Diagnosis not present

## 2019-04-14 DIAGNOSIS — R1031 Right lower quadrant pain: Secondary | ICD-10-CM

## 2019-04-14 DIAGNOSIS — Z113 Encounter for screening for infections with a predominantly sexual mode of transmission: Secondary | ICD-10-CM | POA: Insufficient documentation

## 2019-04-14 DIAGNOSIS — R350 Frequency of micturition: Secondary | ICD-10-CM

## 2019-04-14 DIAGNOSIS — R102 Pelvic and perineal pain: Secondary | ICD-10-CM | POA: Diagnosis not present

## 2019-04-14 LAB — POC URINALSYSI DIPSTICK (AUTOMATED)
Bilirubin, UA: NEGATIVE
Blood, UA: NEGATIVE
Glucose, UA: NEGATIVE
Ketones, UA: NEGATIVE
Leukocytes, UA: NEGATIVE
Nitrite, UA: NEGATIVE
Protein, UA: NEGATIVE
Spec Grav, UA: 1.015 (ref 1.010–1.025)
Urobilinogen, UA: NEGATIVE E.U./dL — AB
pH, UA: 6 (ref 5.0–8.0)

## 2019-04-14 LAB — PSA: PSA: 1.12 ng/mL (ref 0.10–4.00)

## 2019-04-14 MED ORDER — CIPROFLOXACIN HCL 500 MG PO TABS: 500.0000 mg | ORAL_TABLET | Freq: Two times a day (BID) | ORAL | 0 refills | Status: DC

## 2019-04-14 NOTE — Addendum Note (Signed)
Addended by: Hinton Dyer on: 04/14/2019 01:54 PM   Modules accepted: Orders

## 2019-04-14 NOTE — Progress Notes (Signed)
Subjective:    Patient ID: William Fuller, male    DOB: 01-09-1971, 48 y.o.   MRN: [id number]  HPI  Pt in with some rt side groin area pain for about one week. Also some suprapubic area pain. Groin pain came first then suprabpubic pain. Some increased frequency of urination. Also some perinuem pain that was present for 2 days. Perineum pain no longer present. No fever, no chills or sweats.  Pt has same partner for about a year.  Some pt some pain a day after sex.  He does want screening std test.    Review of Systems  Constitutional: Negative for chills, fatigue and fever.  Respiratory: Negative for cough, chest tightness, shortness of breath and wheezing.   Cardiovascular: Negative for chest pain and palpitations.  Gastrointestinal: Negative for abdominal distention, abdominal pain and anal bleeding.       Suprapubic pressure. Rt groin discomfortl  Genitourinary: Positive for frequency. Negative for dysuria, hematuria, penile pain, penile swelling, scrotal swelling and testicular pain.       Perineum pain  Musculoskeletal: Negative for back pain.  Hematological: Negative for adenopathy. Does not bruise/bleed easily.  Psychiatric/Behavioral: Negative for behavioral problems and confusion.   Past Medical History:  Diagnosis Date  . Allergy   . Focal nodular hyperplasia of liver   . GERD (gastroesophageal reflux disease)   . Hypertension   . Intestinal metaplasia of gastric mucosa      Social History   Socioeconomic History  . Marital status: Single    Spouse name: Not on file  . Number of children: Not on file  . Years of education: Not on file  . Highest education level: Not on file  Occupational History  . Not on file  Social Needs  . Financial resource strain: Not on file  . Food insecurity    Worry: Not on file    Inability: Not on file  . Transportation needs    Medical: Not on file    Non-medical: Not on file  Tobacco Use  . Smoking status: Former Smoker     Types: Cigarettes    Quit date: 06/12/2015    Years since quitting: 3.8  . Smokeless tobacco: Never Used  Substance and Sexual Activity  . Alcohol use: No    Alcohol/week: 0.0 standard drinks  . Drug use: Yes    Types: Marijuana    Comment: pt smokes twice a week. Each time 2 joints.  . Sexual activity: Yes  Lifestyle  . Physical activity    Days per week: Not on file    Minutes per session: Not on file  . Stress: Not on file  Relationships  . Social Herbalist on phone: Not on file    Gets together: Not on file    Attends religious service: Not on file    Active member of club or organization: Not on file    Attends meetings of clubs or organizations: Not on file    Relationship status: Not on file  . Intimate partner violence    Fear of current or ex partner: Not on file    Emotionally abused: Not on file    Physically abused: Not on file    Forced sexual activity: Not on file  Other Topics Concern  . Not on file  Social History Narrative  . Not on file    Past Surgical History:  Procedure Laterality Date  . HERNIA REPAIR      Family  History  Problem Relation Age of Onset  . Hypertension Other   . CAD Other   . Breast cancer Mother   . Heart disease Mother   . Heart disease Brother   . Heart disease Maternal Grandmother   . Hypertension Maternal Grandmother   . Healthy Father   . Colon cancer Neg Hx     No Known Allergies  Current Outpatient Medications on File Prior to Visit  Medication Sig Dispense Refill  . clonazePAM (KLONOPIN) 0.5 MG tablet Take 1 tablet (0.5 mg total) by mouth 2 (two) times daily as needed for anxiety. 10 tablet 0  . cyclobenzaprine (FLEXERIL) 10 MG tablet Take 1 tablet (10 mg total) by mouth at bedtime. 5 tablet 0  . erythromycin ophthalmic ointment Place a 1/2 inch ribbon of ointment into the lower eyelid. 3.5 g 0  . famotidine (PEPCID) 20 MG tablet TAKE 1 TO 2 TABLETS BY MOUTH ONCE DAILY 60 tablet 0  . fluticasone  (FLONASE) 50 MCG/ACT nasal spray Place 2 sprays into both nostrils daily. 16 g 0  . ibuprofen (ADVIL,MOTRIN) 600 MG tablet Take 1 tablet (600 mg total) by mouth every 6 (six) hours as needed. 30 tablet 0  . losartan (COZAAR) 25 MG tablet Take 1 tablet (25 mg total) by mouth daily. 30 tablet 0  . ondansetron (ZOFRAN ODT) 8 MG disintegrating tablet 1/2- 1 tablet q 8 hr prn nausea, vomiting 20 tablet 0  . pantoprazole (PROTONIX) 40 MG tablet Take 1 tablet (40 mg total) by mouth 2 (two) times daily. 120 tablet 0  . Polyethyl Glycol-Propyl Glycol (LUBRICANT EYE DROPS) 0.4-0.3 % SOLN Apply 1 drop to eye 3 (three) times daily as needed. 10 mL 0  . sertraline (ZOLOFT) 25 MG tablet Take 1 tablet (25 mg total) by mouth daily. 30 tablet 3  . SUMAtriptan (IMITREX) 50 MG tablet Take 1 tablet (50 mg total) by mouth every 2 (two) hours as needed for migraine. May repeat in 2 hours if headache persists or recurs.(max 2 tab in 24 hour period) 10 tablet 0  . tobramycin (TOBREX) 0.3 % ophthalmic solution Place 2 drops into the left eye every 6 (six) hours. 5 mL 0  . triamcinolone cream (KENALOG) 0.1 % Apply 1 application topically 2 (two) times daily. 30 g 0   No current facility-administered medications on file prior to visit.     BP 131/75   Pulse 65   Temp (!) 97.2 F (36.2 C) (Temporal)   Resp 16   Ht 5\' 10"  (1.778 m)   Wt 244 lb (110.7 kg)   SpO2 100%   BMI 35.01 kg/m       Objective:   Physical Exam  General- No acute distress. Pleasant patient. Neck- Full range of motion, no jvd Lungs- Clear, even and unlabored. Heart- regular rate and rhythm. Neurologic- CNII- XII grossly intact.  Abdomen- faint mid suprapubic tenderness. +bs, no rebound or guarding. No organomegaly.  Back- no cva tenderness.  Genital- no hernia on inguinal canal exam either side. testcles non tender/normal. No dc from penis. No lesions.      Assessment & Plan:  You have various signs/symptoms that indicated  probable prostatitis. Rx cipro antibiotic today.  Will get ua,psa  urine culture, urine std ancillary, rpr and hiv labs.  Hydrate well. If signs/symptoms persist or worsen let us know.  Follow up in 10 days or as needed.  Also recommend that you get scheduled within next month for cpe/wellness exam. That would  include DRE. With acute symptoms decided not to do today.  [name], PA-C

## 2019-04-14 NOTE — Patient Instructions (Addendum)
You have various signs/symptoms that indicated probable prostatitis. Rx cipro antibiotic today.  Will get ua, psa  urine culture, urine std ancillary, rpr and hiv labs.  Hydrate well. If signs/symptoms persist or worsen let us know.  Follow up in 10 days or as needed.  Also recommend that you get scheduled within next month for cpe/wellness exam. That would include DRE. With acute symptoms decided not to do today.

## 2019-04-15 ENCOUNTER — Telehealth: Payer: Self-pay | Admitting: Medical

## 2019-04-15 LAB — URINE CYTOLOGY ANCILLARY ONLY
Chlamydia: POSITIVE — AB
Neisseria Gonorrhea: NEGATIVE
Trichomonas: NEGATIVE

## 2019-04-15 MED ORDER — DOXYCYCLINE HYCLATE 100 MG PO TABS: 100.0000 mg | ORAL_TABLET | Freq: Two times a day (BID) | ORAL | 0 refills | Status: DC

## 2019-04-15 NOTE — Telephone Encounter (Signed)
Rx doxycycline sent to pt pharmacy. 

## 2019-04-17 LAB — URINE CULTURE
MICRO NUMBER:: 871389
Result:: NO GROWTH
SPECIMEN QUALITY:: ADEQUATE

## 2019-04-17 LAB — HIV ANTIBODY (ROUTINE TESTING W REFLEX): HIV 1&2 Ab, 4th Generation: NONREACTIVE

## 2019-04-17 LAB — RPR: RPR Ser Ql: NONREACTIVE

## 2019-04-24 ENCOUNTER — Ambulatory Visit: Payer: BC Managed Care – PPO | Admitting: Medical

## 2019-06-15 ENCOUNTER — Other Ambulatory Visit: Payer: Self-pay

## 2019-06-15 ENCOUNTER — Encounter (HOSPITAL_COMMUNITY): Payer: Self-pay

## 2019-06-15 ENCOUNTER — Ambulatory Visit (HOSPITAL_COMMUNITY)
Admission: EM | Admit: 2019-06-15 | Discharge: 2019-06-15 | Disposition: A | Discharge status: Home / Self Care | Payer: BC Managed Care – PPO | Attending: Internal Medicine | Admitting: Internal Medicine

## 2019-06-15 DIAGNOSIS — M5432 Sciatica, left side: Secondary | ICD-10-CM | POA: Diagnosis not present

## 2019-06-15 MED ORDER — PREDNISONE 20 MG PO TABS: 20.0000 mg | ORAL_TABLET | Freq: Every day | ORAL | 0 refills | Status: AC

## 2019-06-15 MED ORDER — CYCLOBENZAPRINE HCL 10 MG PO TABS: 10.0000 mg | ORAL_TABLET | Freq: Three times a day (TID) | ORAL | 0 refills | Status: DC | PRN

## 2019-06-15 MED ORDER — HYDROCODONE-ACETAMINOPHEN 5-325 MG PO TABS: 2.0000 | ORAL_TABLET | ORAL | 0 refills | Status: DC | PRN

## 2019-06-15 NOTE — ED Triage Notes (Signed)
Pt sates having neck pain and back pain x 4 days. Pt states if he looks down he has neck pain and the pain goes all the way to his lower back. Pt states the back pain is worsened when he sneezes. Pt reports the pain in his back is relief if he sit with his left leg extended.

## 2019-06-15 NOTE — ED Provider Notes (Signed)
William Fuller    CSN: [id number] Arrival date & time: 06/15/19  [id number]      History   Chief Complaint Chief Complaint  Patient presents with  . Back Pain  . Neck Pain    HPI William Fuller is a 48 y.o. male with history of hypertension-controlled, gastric ulcers currently controlled on Protonix comes to urgent care with complaints of back pain of 4 days duration.  Pain is throbbing and sharp in the left shoulder area and also in the mid to lower back.  Pain is aggravated by movement.  He gets some relief when he sitting still.  Pain radiating to the left thigh does not radiate beyond the left knee.  No numbness or tingling in the lower extremities.  No weakness.  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  No trauma to the back.  Patient has a history of upper GI bleed from gastric ulcers.  HPI  Past Medical History:  Diagnosis Date  . Allergy   . Focal nodular hyperplasia of liver   . GERD (gastroesophageal reflux disease)   . Hypertension   . Intestinal metaplasia of gastric mucosa     Patient Active Problem List   Diagnosis Date Noted  . Thrombocytosis ([place]) 10/21/2018  . Bilateral shoulder pain 09/15/2017    Past Surgical History:  Procedure Laterality Date  . HERNIA REPAIR         Home Medications    Prior to Admission medications   Medication Sig Start Date End Date Taking? Authorizing Provider  clonazePAM (KLONOPIN) 0.5 MG tablet Take 1 tablet (0.5 mg total) by mouth 2 (two) times daily as needed for anxiety. 02/10/19   Saguier, Percell Miller, PA-C  cyclobenzaprine (FLEXERIL) 10 MG tablet Take 1 tablet (10 mg total) by mouth 3 (three) times daily as needed for muscle spasms. 06/15/19   Chase Picket, MD  famotidine (PEPCID) 20 MG tablet TAKE 1 TO 2 TABLETS BY MOUTH ONCE DAILY 01/12/19   Saguier, Percell Miller, PA-C  HYDROcodone-acetaminophen (NORCO/VICODIN) 5-325 MG tablet Take 2 tablets by mouth every 4 (four) hours as needed. 06/15/19    Shamila Lerch, Myrene Galas, MD  ibuprofen (ADVIL,MOTRIN) 600 MG tablet Take 1 tablet (600 mg total) by mouth every 6 (six) hours as needed. 09/27/18   Melynda Ripple, MD  pantoprazole (PROTONIX) 40 MG tablet Take 1 tablet (40 mg total) by mouth 2 (two) times daily. 01/12/19   Saguier, Percell Miller, PA-C  predniSONE (DELTASONE) 20 MG tablet Take 1 tablet (20 mg total) by mouth daily for 3 days. 06/15/19 06/18/19  LampteyMyrene Galas, MD  SUMAtriptan (IMITREX) 50 MG tablet Take 1 tablet (50 mg total) by mouth every 2 (two) hours as needed for migraine. May repeat in 2 hours if headache persists or recurs.(max 2 tab in 24 hour period) 08/12/18   Saguier, Percell Miller, PA-C  fluticasone [facility]) 50 MCG/ACT nasal spray Place 2 sprays into both nostrils daily. 09/27/18 06/15/19  Melynda Ripple, MD  losartan (COZAAR) 25 MG tablet Take 1 tablet (25 mg total) by mouth daily. 08/17/18 06/15/19  Saguier, Percell Miller, PA-C  sertraline (ZOLOFT) 25 MG tablet Take 1 tablet (25 mg total) by mouth daily. 02/24/19 06/15/19  Saguier, Percell Miller, PA-C    Family History Family History  Problem Relation Age of Onset  . Hypertension Other   . CAD Other   . Breast cancer Mother   . Heart disease Mother   . Heart disease Brother   . Heart disease Maternal  Grandmother   . Hypertension Maternal Grandmother   . Healthy Father   . Colon cancer Neg Hx     Social History Social History   Tobacco Use  . Smoking status: Former Smoker    Types: Cigarettes    Quit date: 06/12/2015    Years since quitting: 4.0  . Smokeless tobacco: Never Used  Substance Use Topics  . Alcohol use: No    Alcohol/week: 0.0 standard drinks  . Drug use: Yes    Types: Marijuana    Comment: pt smokes twice a week. Each time 2 joints.     Allergies   Patient has no known allergies.   Review of Systems Review of Systems  Constitutional: Positive for activity change. Negative for chills, fatigue, fever and unexpected weight change.  HENT: Negative.    Respiratory: Negative for cough, chest tightness, shortness of breath and wheezing.   Cardiovascular: Negative.   Gastrointestinal: Negative for abdominal pain, diarrhea, nausea and vomiting.  Genitourinary: Negative for dysuria, frequency and urgency.  Musculoskeletal: Positive for arthralgias, back pain and myalgias. Negative for gait problem and joint swelling.  Skin: Negative.   Neurological: Negative for dizziness, weakness, light-headedness and headaches.  Psychiatric/Behavioral: Negative for confusion and decreased concentration.     Physical Exam Triage Vital Signs ED Triage Vitals  Enc Vitals Group     BP 06/15/19 0949 126/83     Pulse Rate 06/15/19 0949 97     Resp 06/15/19 0949 18     Temp 06/15/19 0949 98.4 F (36.9 C)     Temp Source 06/15/19 0949 Temporal     SpO2 06/15/19 0949 95 %     Weight --      Height --      Head Circumference --      Peak Flow --      Pain Score 06/15/19 0946 6     Pain Loc --      Pain Edu? --      Excl. in [place]? --    No data found.  Updated Vital Signs BP 126/83 (BP Location: Left Arm)   Pulse 97   Temp 98.4 F (36.9 C) (Temporal)   Resp 18   SpO2 95%   Visual Acuity Right Eye Distance:   Left Eye Distance:   Bilateral Distance:    Right Eye Near:   Left Eye Near:    Bilateral Near:     Physical Exam Vitals signs and nursing note reviewed.  Constitutional:      General: He is not in acute distress.    Appearance: Normal appearance. He is not ill-appearing.  HENT:     Nose: No congestion or rhinorrhea.     Mouth/Throat:     Mouth: Mucous membranes are moist.     Pharynx: No oropharyngeal exudate or posterior oropharyngeal erythema.  Eyes:     Extraocular Movements: Extraocular movements intact.     Conjunctiva/sclera: Conjunctivae normal.  Cardiovascular:     Rate and Rhythm: Normal rate and regular rhythm.     Pulses: Normal pulses.     Heart sounds: Normal heart sounds. No murmur. No friction rub.   Pulmonary:     Effort: Pulmonary effort is normal.     Breath sounds: Normal breath sounds. No wheezing or rhonchi.  Abdominal:     General: Bowel sounds are normal.     Palpations: Abdomen is soft.     Tenderness: There is no abdominal tenderness. There is no guarding or rebound.  Musculoskeletal: Normal range of motion.        General: Tenderness present. No swelling or signs of injury.     Comments: Tenderness to palpation over the left paraspinal muscles in the lumbar region.  Skin:    General: Skin is warm.     Capillary Refill: Capillary refill takes less than 2 seconds.     Coloration: Skin is not jaundiced.     Findings: No erythema or lesion.  Neurological:     General: No focal deficit present.     Mental Status: He is alert and oriented to person, place, and time.     Cranial Nerves: No cranial nerve deficit.     Sensory: No sensory deficit.     Motor: No weakness.     Coordination: Coordination normal.     Gait: Gait normal.     Deep Tendon Reflexes: Reflexes normal.      UC Treatments / Results  Labs (all labs ordered are listed, but only abnormal results are displayed) Labs Reviewed - No data to display  EKG   Radiology No results found.  Procedures Procedures (including critical care time)  Medications Ordered in UC Medications - No data to display  Initial Impression / Assessment and Plan / UC Course  I have reviewed the triage vital signs and the nursing notes.  Pertinent labs & imaging results that were available during my care of the patient were reviewed by me and considered in my medical decision making (see chart for details).     1.  Back pain with left-sided sciatica: Flexeril 10 mg 3 times daily as needed Hydrocodone 5-325 mg every 6 hours as needed Heat therapy-instructions given Given the recent upper GI bleed, NSAIDs will not be a good choice for the patient. Prednisone 20 mg daily x3 days. If patient symptoms worsens he is  advised to return to urgent care to be reevaluated. There is no indication for radiologic evaluation at this time. Final Clinical Impressions(s) / UC Diagnoses   Final diagnoses:  Left sided sciatica   Discharge Instructions   None    ED Prescriptions    Medication Sig Dispense Auth. Provider   cyclobenzaprine (FLEXERIL) 10 MG tablet Take 1 tablet (10 mg total) by mouth 3 (three) times daily as needed for muscle spasms. 30 tablet Marq Rebello, Myrene Galas, MD   HYDROcodone-acetaminophen (NORCO/VICODIN) 5-325 MG tablet Take 2 tablets by mouth every 4 (four) hours as needed. 10 tablet Rumi Taras, Myrene Galas, MD   predniSONE (DELTASONE) 20 MG tablet Take 1 tablet (20 mg total) by mouth daily for 3 days. 3 tablet Aubriee Szeto, Myrene Galas, MD     I have reviewed the PDMP during this encounter.   Chase Picket, MD 06/15/19 1053

## 2019-06-19 ENCOUNTER — Ambulatory Visit: Payer: BC Managed Care – PPO | Admitting: Medical

## 2019-06-21 ENCOUNTER — Encounter: Payer: Self-pay | Admitting: Medical

## 2019-06-23 ENCOUNTER — Other Ambulatory Visit: Payer: Self-pay

## 2019-06-23 ENCOUNTER — Encounter: Payer: Self-pay | Admitting: Medical

## 2019-06-23 ENCOUNTER — Ambulatory Visit (INDEPENDENT_AMBULATORY_CARE_PROVIDER_SITE_OTHER): Payer: BC Managed Care – PPO | Admitting: Medical

## 2019-06-23 DIAGNOSIS — R195 Other fecal abnormalities: Secondary | ICD-10-CM | POA: Diagnosis not present

## 2019-06-23 DIAGNOSIS — U071 COVID-19: Secondary | ICD-10-CM | POA: Diagnosis not present

## 2019-06-23 DIAGNOSIS — R059 Cough, unspecified: Secondary | ICD-10-CM

## 2019-06-23 DIAGNOSIS — R05 Cough: Secondary | ICD-10-CM

## 2019-06-23 MED ORDER — AZITHROMYCIN 250 MG PO TABS: ORAL_TABLET | ORAL | 0 refills | Status: DC

## 2019-06-23 NOTE — Patient Instructions (Addendum)
You have covid infection and overall describe feel better but have some productive cough and some loose stools.   Will start azithromycin and benzonatate  in event of secondary infection/pneumonia. Get 02 sat monitor thru friend. Counseled on good numbers and number that would indicate need for ED evaluation.  For loose stools, recommend bland diet and hydrate with propel. Counseled sometimes common finding with covid.  Stay quarantined and follow up with me on Wednesday by virtual visit.  If any questions during the interim my chart me.

## 2019-06-23 NOTE — Progress Notes (Signed)
   Subjective:    Patient ID: William Fuller, male    DOB: [birth year], 48 y.o.   MRN: [id number]  HPI  Virtual Visit via Telephone Note  I connected with William Fuller on 06/23/19 at  2:00 PM EST by telephone and verified that I am speaking with the correct person using two identifiers.  Location: Patient: home Provider: office   I discussed the limitations, risks, security and privacy concerns of performing an evaluation and management service by telephone and the availability of in person appointments. I also discussed with the patient that there may be a patient responsible charge related to this service. The patient expressed understanding and agreed to proceed.   History of Present Illness:   Pt in for recent positive covid.  2 weeks ago he felt fatigued and developed. Then eventually got nasal congestion past week. On 06-15-2019 lost smell and taste for 3 days. So he went and got tested for covid. On 06-16-2019 his covid test came back positive.   Pt states he has been coughing up mucous. He has no wheezing. When ambulates feels mild sob. But not severe and not after short distance.  Pt has been home since he got diagnosis.  He has not checked temp today. 1st week when felt fatigue had fever. Pt just started with loose stools 2 days ago.  Pt is trying to hydrate well. Overall feeling better.     Observations/Objective:   Assessment and Plan: You have covid infection and overall describe feel better but have some productive cough and some loose stools.   Will start azithromycin and benzonatate  in event of secondary infection/pneumonia. Get 02 sat monitor thru friend. Counseled on good numbers and number that would indicate need for ED evaluation.  For loose stools, recommend bland diet and hydrate with propel. Counseled sometimes common finding with covid.  Stay quarantined and follow up with me on Wednesday by virtual visit.  If any questions during the interim  my chart me.  Follow Up Instructions:    I discussed the assessment and treatment plan with the patient. The patient was provided an opportunity to ask questions and all were answered. The patient agreed with the plan and demonstrated an understanding of the instructions.   The patient was advised to call back or seek an in-person evaluation if the symptoms worsen or if the condition fails to improve as anticipated.  I provided 25 minutes of non-face-to-face time during this encounter.   [name], PA-C   Review of Systems  Constitutional: Negative for chills, fatigue and fever.  HENT: Negative for dental problem.   Respiratory: Positive for cough. Negative for choking, shortness of breath and wheezing.   Cardiovascular: Negative for chest pain and palpitations.  Gastrointestinal: Negative for abdominal pain, nausea and vomiting.       Loose stools  Musculoskeletal: Negative for back pain.  Skin: Negative for rash.  Neurological: Negative for dizziness, seizures, weakness and headaches.  Hematological: Negative for adenopathy. Does not bruise/bleed easily.  Psychiatric/Behavioral: Negative for behavioral problems, confusion, sleep disturbance and suicidal ideas. The patient is not nervous/anxious.        Objective:   Physical Exam        Assessment & Plan:

## 2019-07-05 ENCOUNTER — Encounter: Payer: Self-pay | Admitting: Medical

## 2019-07-05 ENCOUNTER — Other Ambulatory Visit: Payer: Self-pay

## 2019-07-05 ENCOUNTER — Ambulatory Visit (INDEPENDENT_AMBULATORY_CARE_PROVIDER_SITE_OTHER): Payer: BC Managed Care – PPO | Admitting: Medical

## 2019-07-05 VITALS — BP 138/88 | HR 88

## 2019-07-05 DIAGNOSIS — S29011A Strain of muscle and tendon of front wall of thorax, initial encounter: Secondary | ICD-10-CM | POA: Diagnosis not present

## 2019-07-05 DIAGNOSIS — J329 Chronic sinusitis, unspecified: Secondary | ICD-10-CM

## 2019-07-05 DIAGNOSIS — Z8616 Personal history of COVID-19: Secondary | ICD-10-CM

## 2019-07-05 DIAGNOSIS — Z8619 Personal history of other infectious and parasitic diseases: Secondary | ICD-10-CM

## 2019-07-05 MED ORDER — DOXYCYCLINE HYCLATE 100 MG PO TABS: ORAL_TABLET | ORAL | 0 refills | Status: DC

## 2019-07-05 MED ORDER — PREDNISONE 10 MG PO TABS: ORAL_TABLET | ORAL | 0 refills | Status: DC

## 2019-07-05 MED ORDER — LOSARTAN POTASSIUM 25 MG PO TABS: 25.0000 mg | ORAL_TABLET | Freq: Every day | ORAL | 3 refills | Status: DC

## 2019-07-05 NOTE — Progress Notes (Signed)
Subjective:    Patient ID: William Fuller, male    DOB: [birth year], 48 y.o.   MRN: [id number]  HPI  Virtual Visit via Video Note  I connected with William Fuller on 07/05/19 at 10:20 AM EST by a video enabled telemedicine application and verified that I am speaking with the correct person using two identifiers.  Location: Patient: home Provider: office   I discussed the limitations of evaluation and management by telemedicine and the availability of in person appointments. The patient expressed understanding and agreed to proceed.  Pt has no bp cuff.  History of Present Illness: Pt has some sinus pressure, ha and nasal congestion.  Pt had covid infection on 06/16/2019. I saw him on 06/23/19 and gave azithromycin. Pt has no cough, no chills, no sweats, and no body aches.   He states occasional walking will feel very minimal mild transient sob. No leg pain reported. No wheezing.  Pt has been off of work since 06-12-2019.  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   Pt state when he leans over to try to grab something will have transient for about 5 seconds. If he grabs something and pulls to thorax will hurt very briefly. He points to left pectoralis area beneath axillary.  Pt has losartan rx.     Observations/Objective: General-no acute distress, pleasant, oriented. Lungs- on inspection lungs appear unlabored. Neck- no tracheal deviation or jvd on inspection. Neuro- gross motor function appears intact. When he bends over and pulls left arm upward brief transient pectoral pain.  heent- mild maxillary sinus pressure when he palpates.   Assessment and Plan: For your nasal congestin and sinus pressure(possible sinus infection) post covid infection I prescribed doxycycline antibiotic and 4 day taper prednisone.  Appears that you are doing relatively well post covid infection.   Your transient left pectoralis area pain appears to  be msk pain as it is induced by movement and transient. But if becomes constant or other cardiac type associated symptoms then recommend ED evaluation. If Msk type pain then taper prednisone may give some relief.  Will review fmla paperwork when it is faxed.   Follow up 7-10 days or as needed  Follow Up Instructions:    I discussed the assessment and treatment plan with the patient. The patient was provided an opportunity to ask questions and all were answered. The patient agreed with the plan and demonstrated an understanding of the instructions.   The patient was advised to call back or seek an in-person evaluation if the symptoms worsen or if the condition fails to improve as anticipated.  I provided 25 minutes of non-face-to-face time during this encounter.   [name], PA-C    Review of Systems  Constitutional: Positive for fatigue. Negative for chills and fever.       Minimal.  HENT: Positive for congestion and sinus pressure. Negative for facial swelling and postnasal drip.   Respiratory: Negative for choking, shortness of breath and wheezing.        See hpi  Cardiovascular: Negative for chest pain and palpitations.  Gastrointestinal: Negative for abdominal pain, diarrhea, nausea and vomiting.  Genitourinary: Negative for dysuria.  Musculoskeletal: Negative for back pain.  Neurological: Negative for dizziness, speech difficulty, weakness and headaches.  Hematological: Negative for adenopathy.  Psychiatric/Behavioral: Negative for behavioral problems and confusion.       Objective:   Physical Exam        Assessment &  Plan:

## 2019-07-05 NOTE — Patient Instructions (Addendum)
For your nasal congestin and sinus pressure(possible sinus infection) post covid infection I prescribed doxycycline antibiotic and 4 day taper prednisone.  Appears that you are doing relatively well post covid infection.   Your transient left pectoralis area pain appears to be msk pain as it is induced by movement and transient. But if becomes constant or other cardiac type associated symptoms then recommend ED evaluation. If Msk type pain then taper prednisone may give some relief.  Will review fmla paperwork when it is faxed.   Follow up 7-10 days or as needed

## 2019-07-07 ENCOUNTER — Encounter: Payer: Self-pay | Admitting: Medical

## 2019-07-10 ENCOUNTER — Encounter: Payer: Self-pay | Admitting: Medical

## 2019-07-11 ENCOUNTER — Telehealth: Payer: Self-pay | Admitting: Medical

## 2019-07-11 NOTE — Telephone Encounter (Signed)
Will you call patient and get him scheduled 07/12/2019 for video or phone visit at 1:20 to discuss fmla paperwork.

## 2019-07-11 NOTE — Telephone Encounter (Signed)
Opened to review 

## 2019-07-12 ENCOUNTER — Ambulatory Visit (INDEPENDENT_AMBULATORY_CARE_PROVIDER_SITE_OTHER): Payer: BC Managed Care – PPO | Admitting: Medical

## 2019-07-12 VITALS — BP 143/92 | Temp 97.7°F

## 2019-07-12 DIAGNOSIS — U071 COVID-19: Secondary | ICD-10-CM | POA: Diagnosis not present

## 2019-07-12 DIAGNOSIS — R0981 Nasal congestion: Secondary | ICD-10-CM

## 2019-07-12 NOTE — Patient Instructions (Addendum)
You appear to have recovered from covid. No only faint residual nasal congestion at night. Recommend that you use flonase daily and finish the remainding days of antibiotic.  Filled out qualifying condition form. Asking staff to fax over it today and sending return to work note to my chart.  Your bp is mild borderline high today. Next time in office will check. If still high then increase your losartan to 50 mg daily. With virtual visit and at home reading want to recheck myself manually before making bp changes.  Follow up 2 months or as needed

## 2019-07-12 NOTE — Progress Notes (Signed)
   Subjective:    Patient ID: William Fuller, male    DOB: 08/22/1970, 48 y.o.   MRN: [id number]  HPI  Virtual Visit via Telephone Note  I connected with William Fuller on 07/12/19 at  1:20 PM EST by telephone and verified that I am speaking with the correct person using two identifiers.  Location: Patient: home. Provider: office   I discussed the limitations, risks, security and privacy concerns of performing an evaluation and management service by telephone and the availability of in person appointments. I also discussed with the patient that there may be a patient responsible charge related to this service. The patient expressed understanding and agreed to proceed.   History of Present Illness: Pt states he feels good negative symptoms and recovered from covid infection. At this point only has night time nasal congestion  Pt had covid positive test on 06/16/2019.      Observations/Objective: General- no acute distress. Pleasant, oriented, normal speech.  Assessment and Plan:  You appear to have recovered from covid. No only faint residual nasal congestion at night. Recommend that you use flonase daily and finish the remainding days of antibiotic.  Filled out qualifying condition form. Asking staff to fax over it today and sending return to work note to my chart.  Your bp is mild borderline high today. Next time in office will check. If still high then increase your losartan to 50 mg daily. With virtual visit and at home reading want to recheck myself manually before making bp changes.  Follow up 2 months or as needed Follow Up Instructions:    I discussed the assessment and treatment plan with the patient. The patient was provided an opportunity to ask questions and all were answered. The patient agreed with the plan and demonstrated an understanding of the instructions.   The patient was advised to call back or seek an in-person evaluation if the symptoms worsen or if  the condition fails to improve as anticipated.  I provided 15 minutes of non-face-to-face time during this encounter.   [name], PA-C    Review of Systems  Constitutional: Negative for chills, fatigue and fever.  HENT: Positive for congestion. Negative for mouth sores, postnasal drip, sinus pressure, sinus pain and sneezing.   Respiratory: Negative for cough, chest tightness, shortness of breath and wheezing.   Cardiovascular: Negative for chest pain and palpitations.  Gastrointestinal: Negative for abdominal pain.  Musculoskeletal: Negative for back pain and myalgias.  Neurological: Negative for headaches.  Hematological: Negative for adenopathy. Does not bruise/bleed easily.       Objective:   Physical Exam        Assessment & Plan:

## 2019-07-12 NOTE — Telephone Encounter (Signed)
Left message to see if we can put him down for 120pm today for virtual or phone visit.

## 2019-10-02 IMAGING — DX DG SHOULDER 2+V*L*
3 series · 3 of 3 positions shown · non-contrast
Comparison: None.

CLINICAL DATA: Chronic bilateral shoulder pain radiating to the
neck

EXAM:
LEFT SHOULDER - 2+ VIEW

[shoulder grashey]
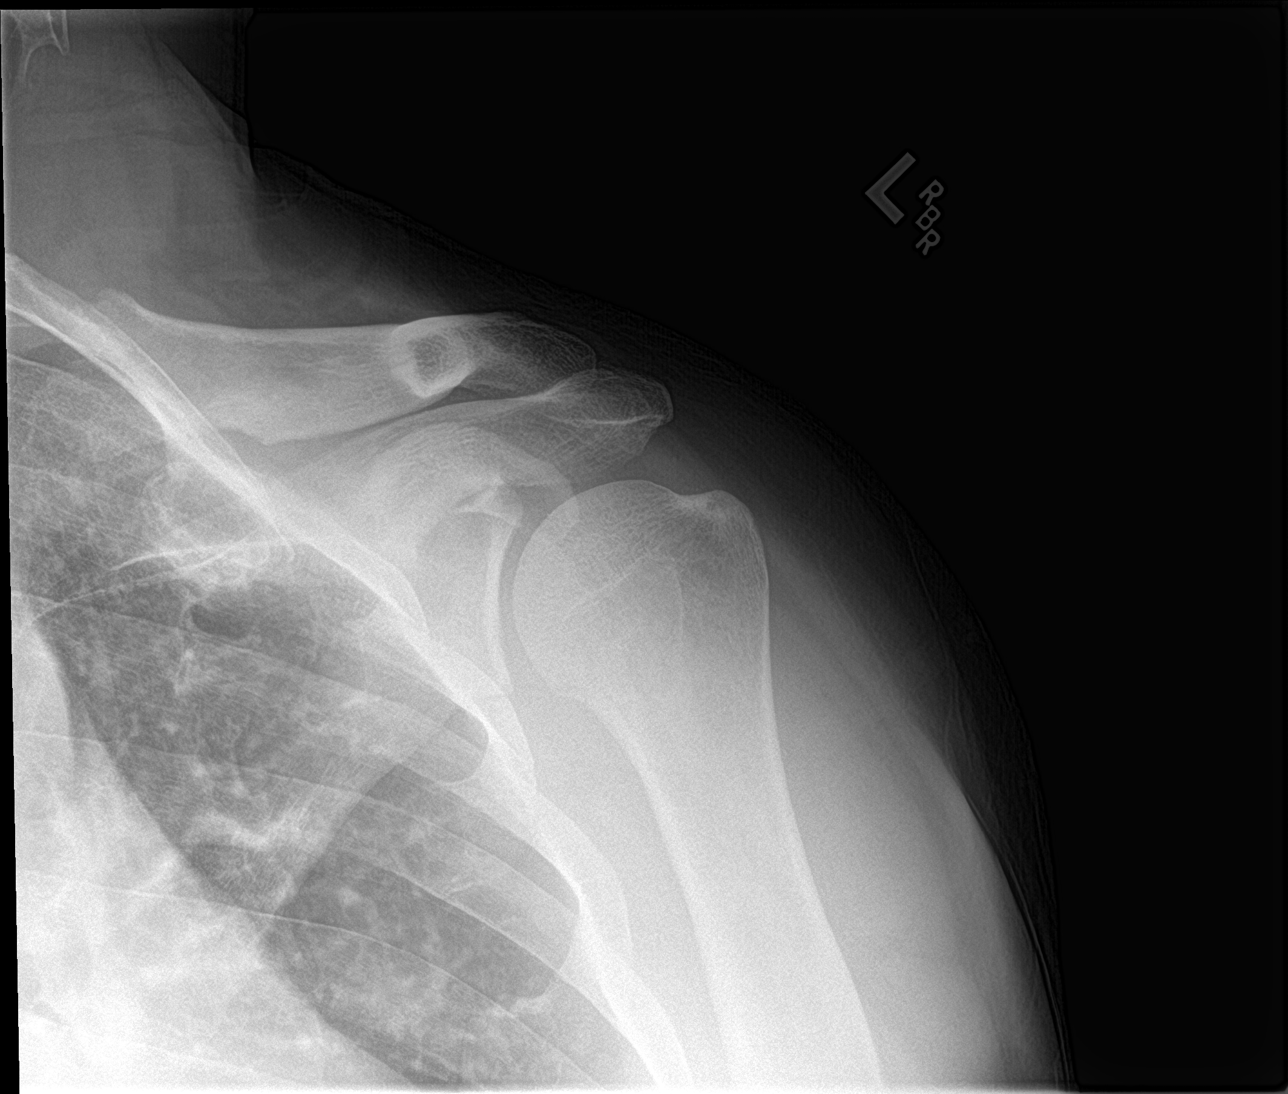

[shoulder y view]
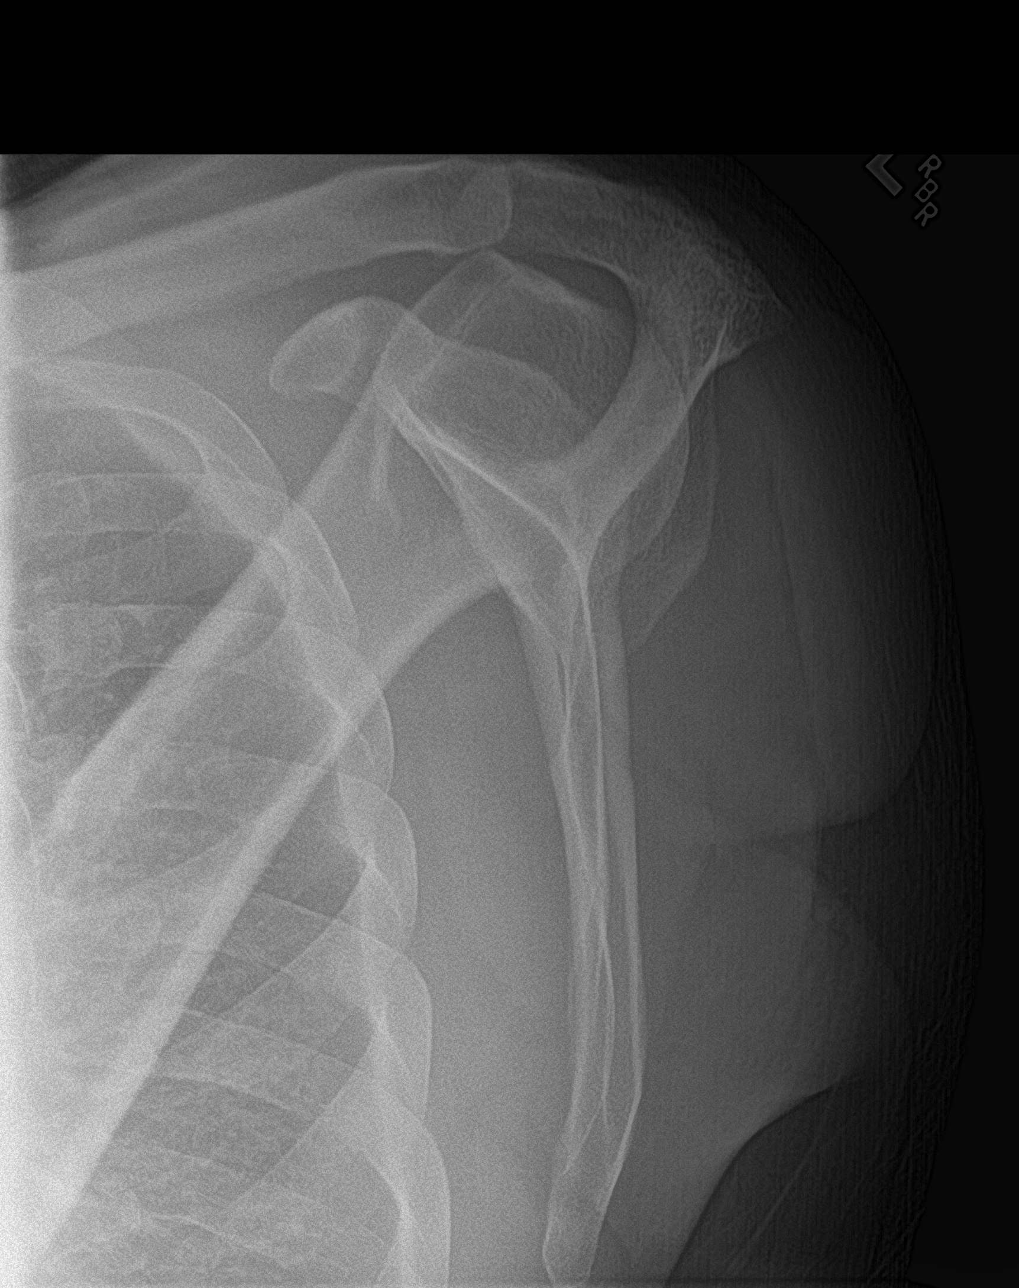

[shoulder axillary]
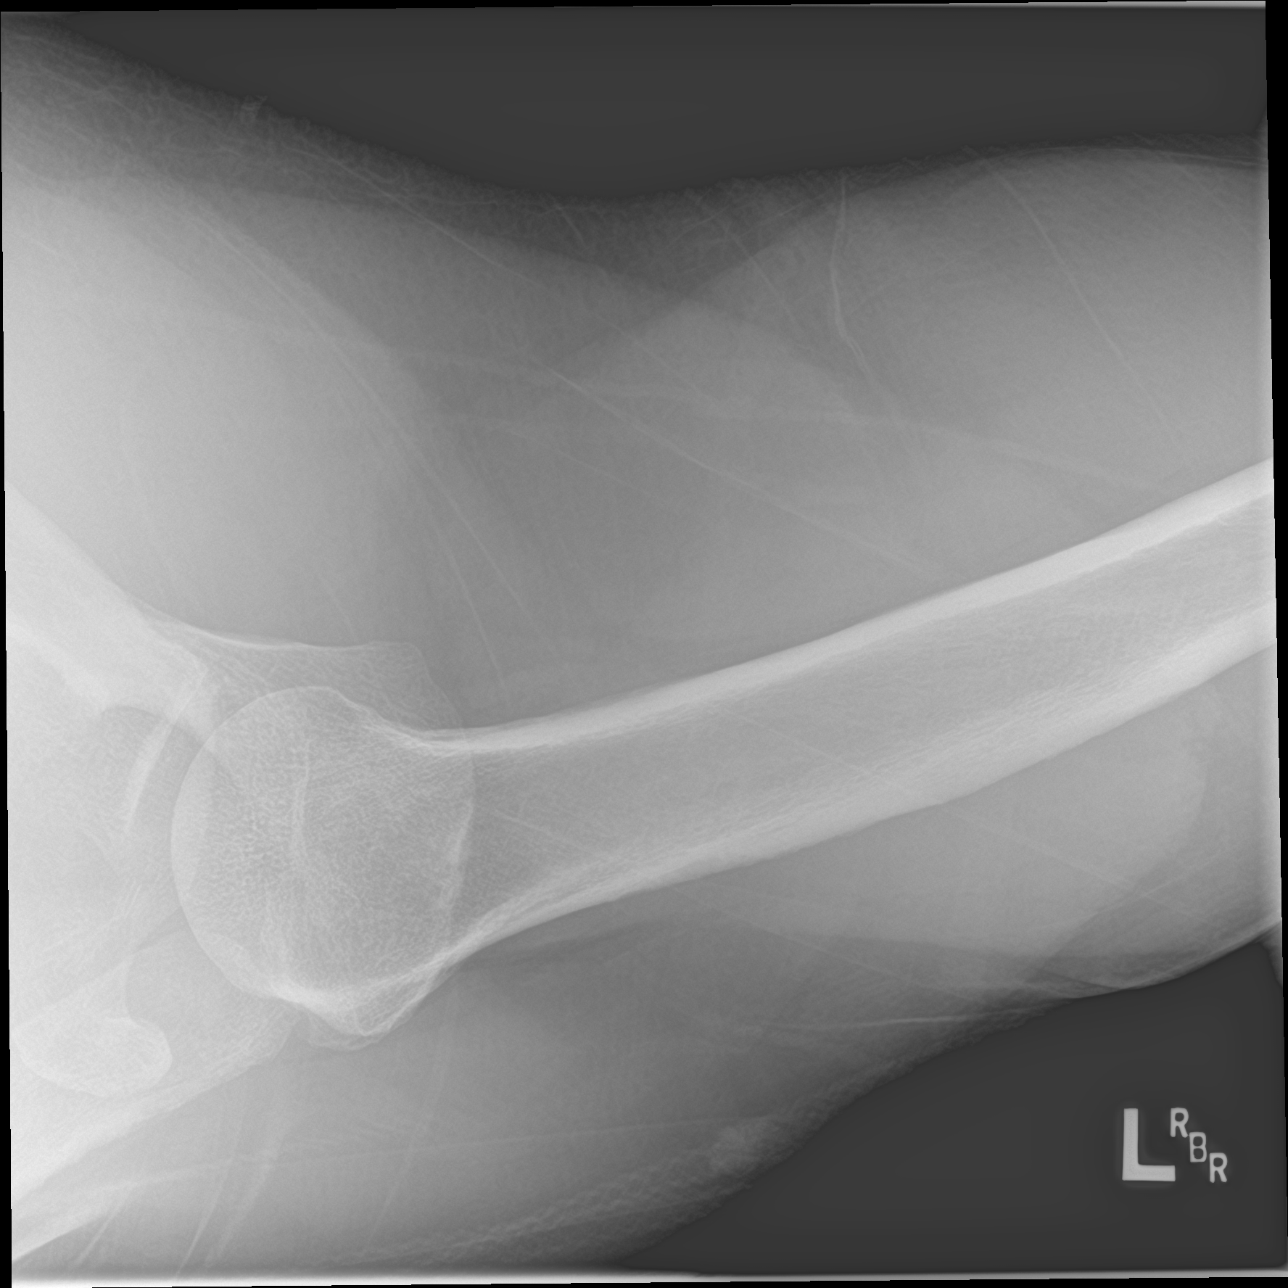

[3 of 3 positions shown; findings below may reference images not displayed]

FINDINGS: The left humeral head is in normal position and the left
glenohumeral joint space appears normal. No significant degenerative
change is seen. The left AC joint appears normally aligned.
IMPRESSION: Negative.

## 2019-10-09 ENCOUNTER — Encounter: Payer: Self-pay | Admitting: Medical

## 2019-10-10 ENCOUNTER — Other Ambulatory Visit: Payer: Self-pay

## 2019-10-10 MED ORDER — PANTOPRAZOLE SODIUM 40 MG PO TBEC: 40.0000 mg | DELAYED_RELEASE_TABLET | Freq: Two times a day (BID) | ORAL | 0 refills | Status: DC

## 2019-11-14 ENCOUNTER — Encounter: Payer: Self-pay | Admitting: Medical

## 2019-11-14 MED ORDER — LOSARTAN POTASSIUM 25 MG PO TABS: 25.0000 mg | ORAL_TABLET | Freq: Every day | ORAL | 3 refills | Status: DC

## 2019-12-29 ENCOUNTER — Encounter: Payer: Self-pay | Admitting: Gastroenterology

## 2020-03-26 IMAGING — US US ABDOMEN LIMITED
1 series · 14 of 25 positions shown · non-contrast
Comparison: MRI abdomen 01/20/2017.  CT abdomen 01/08/2017.

CLINICAL DATA: Follow-up LEFT lobe enhancing liver mass identified
on prior CT and MRI.

EXAM:
ULTRASOUND ABDOMEN LIMITED RIGHT UPPER QUADRANT

[Series 1: us abdomen limited · 0.23mm/px · 14 of 84 slices shown]
[im 1/84]
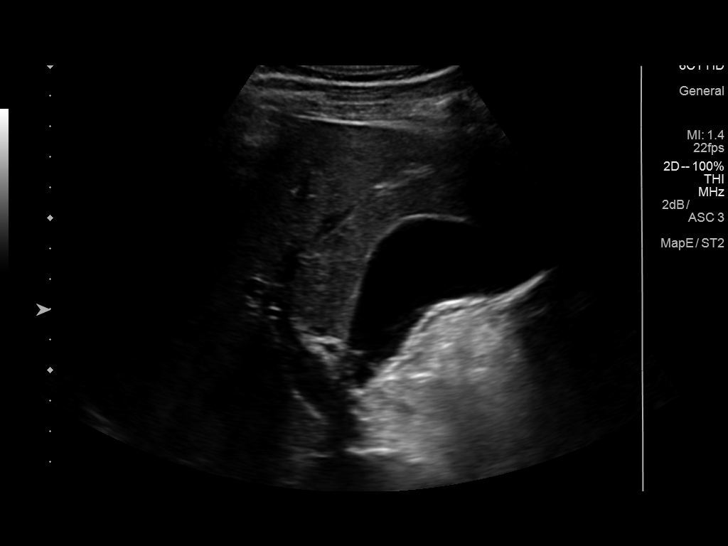
[im 7/84]
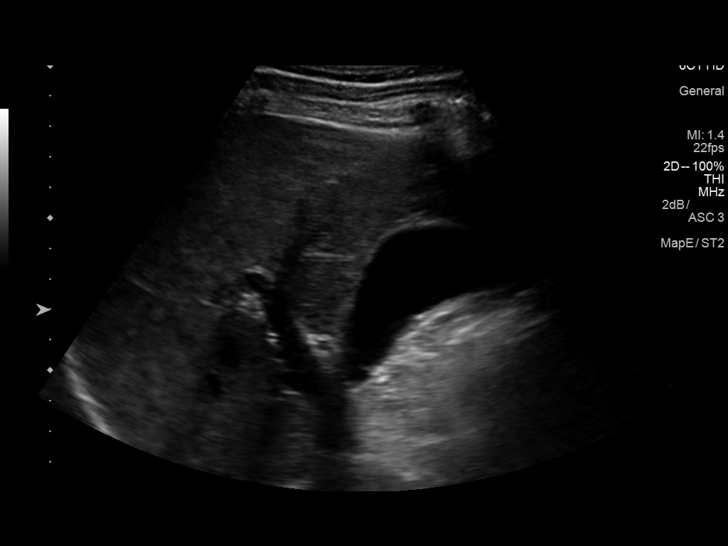
[im 14/84]
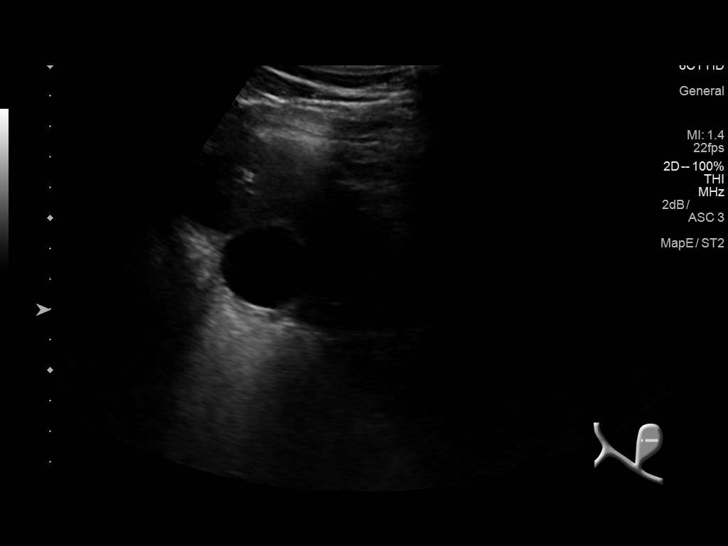
[im 21/84]
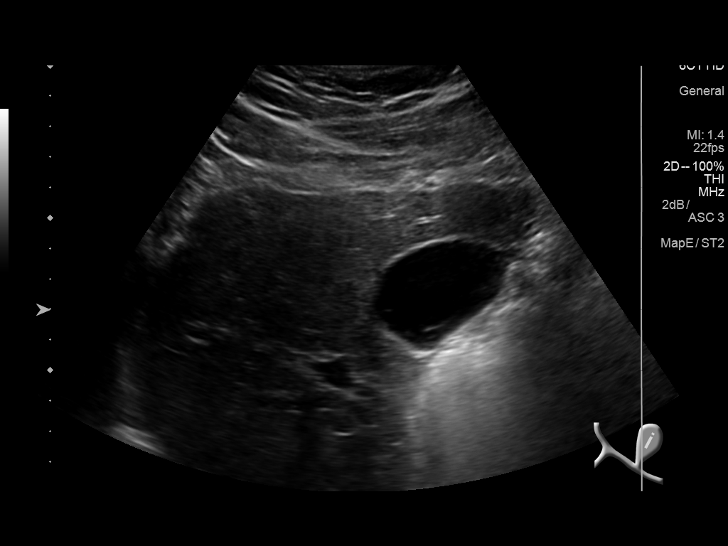
[im 28/84]
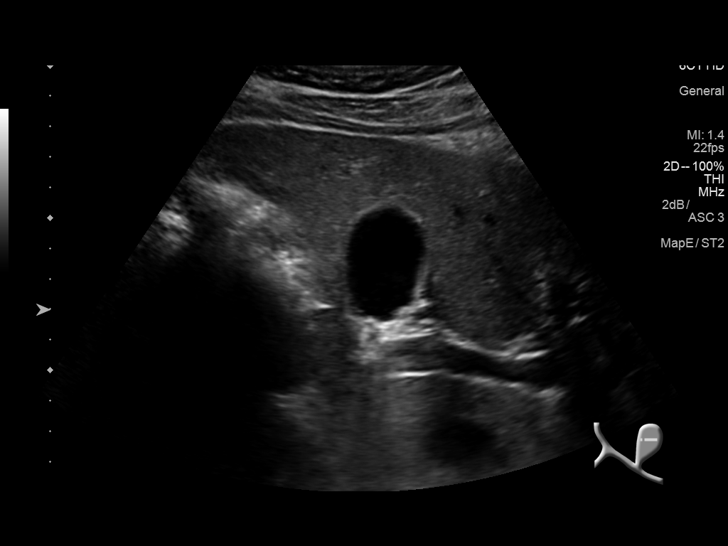
[im 32/84]
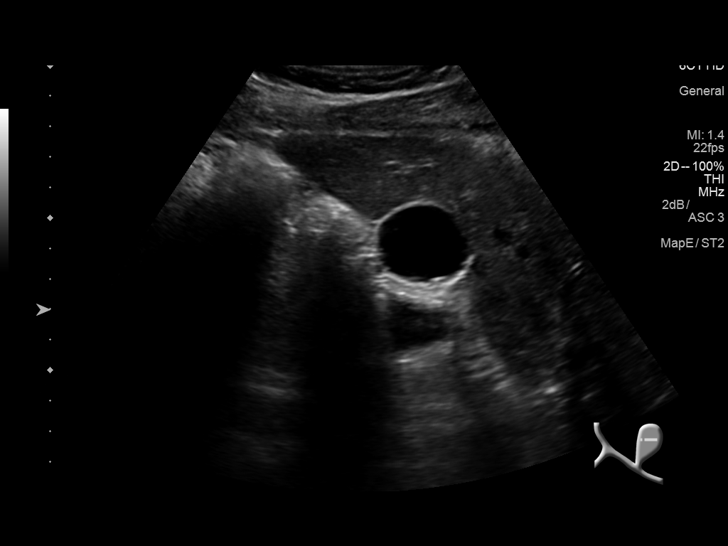
[im 39/84]
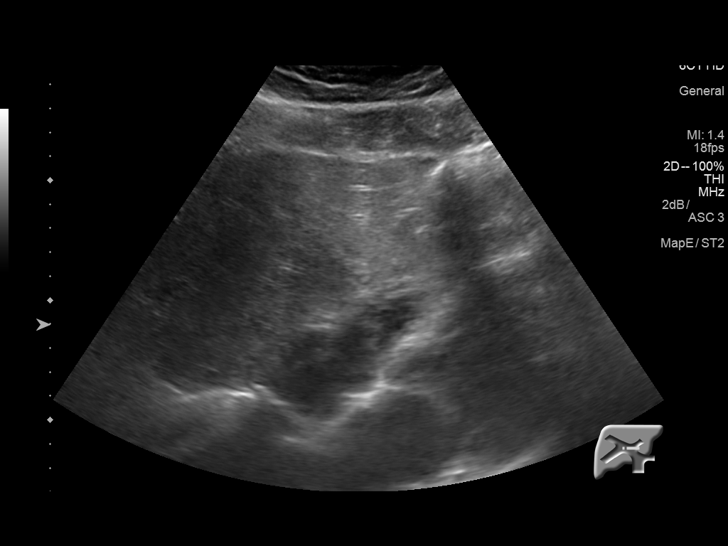
[im 45/84]
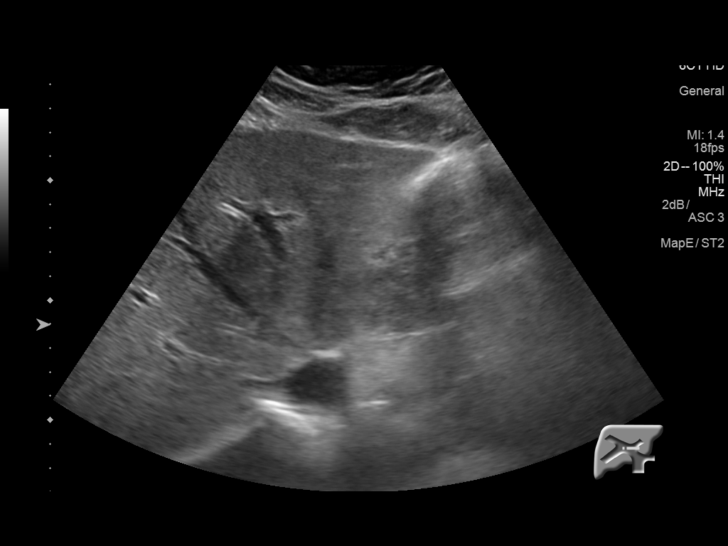
[im 52/84]
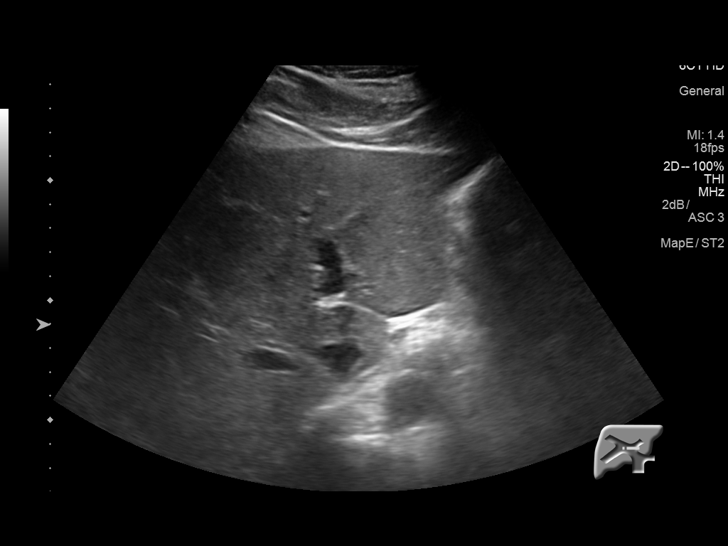
[im 56/84]
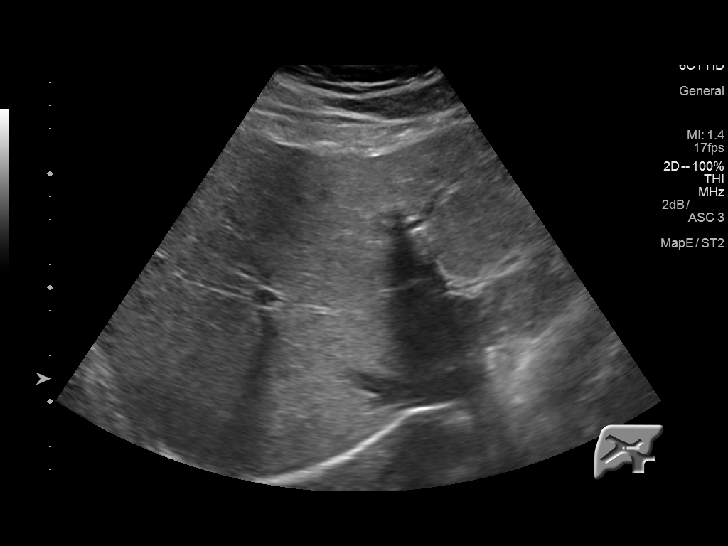
[im 63/84]
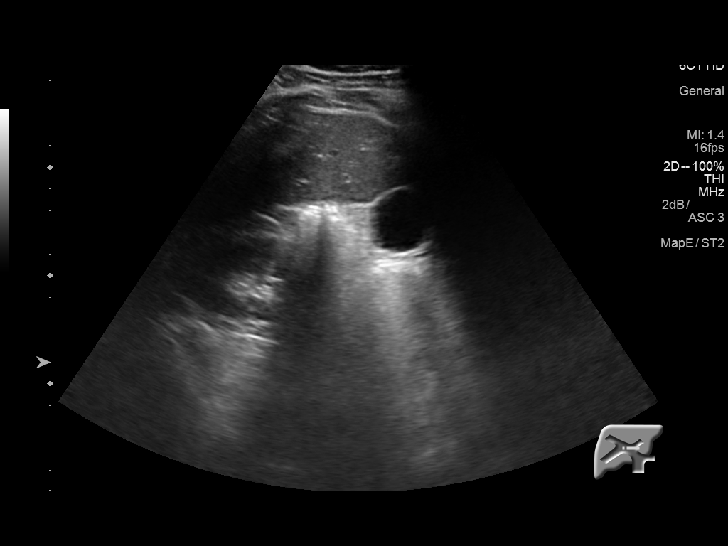
[im 70/84]
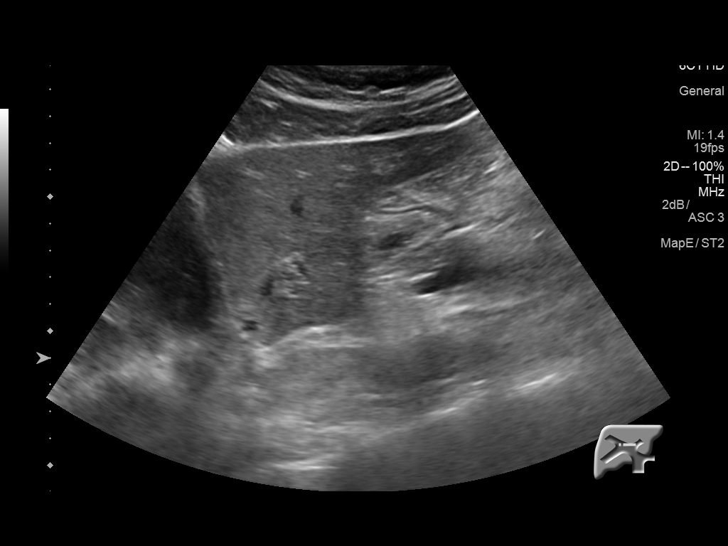
[im 77/84]
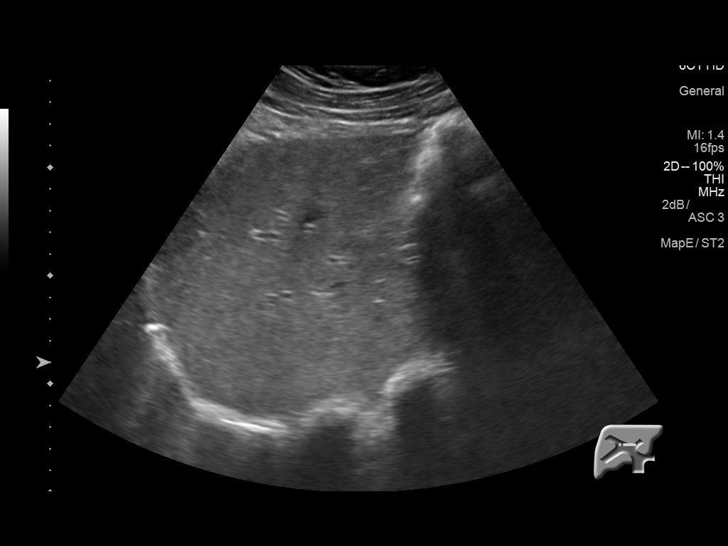
[im 84/84]
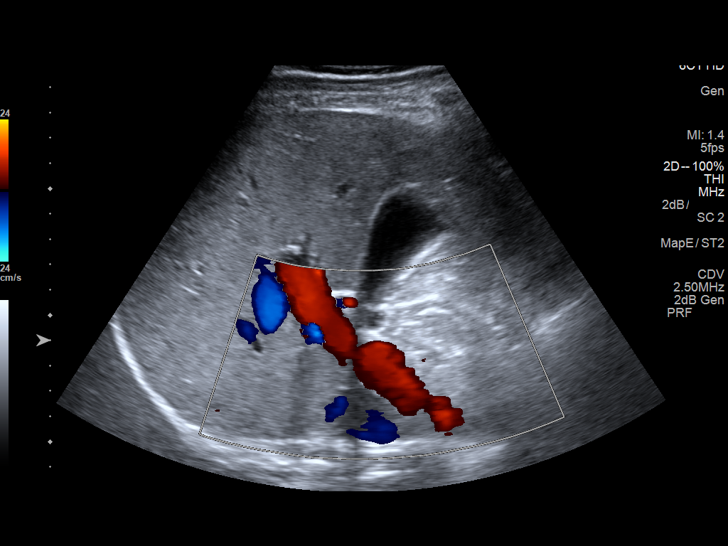

[14 of 25 positions shown; findings below may reference images not displayed]

FINDINGS: Gallbladder:

Gallbladder normal in appearance without calcified gallstones. No
biliary ductal dilation.

Common bile duct:

Diameter: Approximately 3 mm.

Liver:

The mass in the LEFT lobe of the liver identified on the prior
studies is not visible at ultrasound. Liver normal in size and
echotexture without focal parenchymal abnormality. Portal vein is
patent on color Doppler imaging with normal direction of blood flow
towards the liver.
IMPRESSION: 1. The mass in the LEFT lobe of the liver identified on prior CT and
MRI is not visible on ultrasound. Therefore, triple phase CT of the
abdomen is recommended in further evaluation to confirm stability.
2.  Normal examination.

## 2020-04-11 ENCOUNTER — Other Ambulatory Visit: Payer: Self-pay | Admitting: Medical

## 2020-04-11 ENCOUNTER — Encounter: Payer: Self-pay | Admitting: Medical

## 2020-04-11 MED ORDER — LOSARTAN POTASSIUM 25 MG PO TABS: 25.0000 mg | ORAL_TABLET | Freq: Every day | ORAL | 3 refills | Status: AC

## 2020-04-12 MED ORDER — PANTOPRAZOLE SODIUM 40 MG PO TBEC: 40.0000 mg | DELAYED_RELEASE_TABLET | Freq: Two times a day (BID) | ORAL | 0 refills | Status: DC

## 2020-07-27 ENCOUNTER — Encounter: Payer: Self-pay | Admitting: Medical

## 2020-07-30 ENCOUNTER — Encounter: Payer: Self-pay | Admitting: Family Medicine

## 2020-07-30 ENCOUNTER — Telehealth (INDEPENDENT_AMBULATORY_CARE_PROVIDER_SITE_OTHER): Payer: HRSA Program | Admitting: Family Medicine

## 2020-07-30 VITALS — BP 127/85 | Ht 71.0 in | Wt 233.0 lb

## 2020-07-30 DIAGNOSIS — U071 COVID-19: Secondary | ICD-10-CM

## 2020-07-30 NOTE — Progress Notes (Signed)
Virtual Visit via Video Note  I connected with William Fuller  on 07/30/20 at  1:20 PM EST by a video enabled telemedicine application and verified that I am speaking with the correct person using two identifiers.  Location patient: home, [place] Location provider:work or home office Persons participating in the virtual visit: patient, provider  I discussed the limitations of evaluation and management by telemedicine and the availability of in person appointments. The patient expressed understanding and agreed to proceed.   HPI:  Acute telemedicine visit for COVID19: -Onset: 1 week ago - positive covid test on 12/25 -Symptoms include: nasal congestion, ears were hurting, a little HA, mild cough, some diarrhea the first few days -Denies: fever, CP, SOB, vomiting -Has tried:his migraine medication for the headache, currently mild and denies severe headache at any point -Pertinent past medical history:denies any, denies obesity or overweight -Pertinent medication allergies: nkda -COVID-19 vaccine status: not vaccinated, had covid 2 years ago  ROS: See pertinent positives and negatives per HPI.  Past Medical History:  Diagnosis Date  . Allergy   . Esophageal varices (HCC)   . Focal nodular hyperplasia of liver   . GERD (gastroesophageal reflux disease)   . Hypertension   . Intestinal metaplasia of gastric mucosa     Past Surgical History:  Procedure Laterality Date  . HERNIA REPAIR       Current Outpatient Medications:  .  famotidine (PEPCID) 20 MG tablet, TAKE 1 TO 2 TABLETS BY MOUTH ONCE DAILY, Disp: 60 tablet, Rfl: 0 .  ibuprofen (ADVIL,MOTRIN) 600 MG tablet, Take 1 tablet (600 mg total) by mouth every 6 (six) hours as needed., Disp: 30 tablet, Rfl: 0 .  losartan (COZAAR) 25 MG tablet, Take 1 tablet (25 mg total) by mouth daily., Disp: 30 tablet, Rfl: 3 .  pantoprazole (PROTONIX) 40 MG tablet, Take 1 tablet (40 mg total) by mouth 2 (two) times daily., Disp: 120 tablet, Rfl: 0 .   SUMAtriptan (IMITREX) 50 MG tablet, Take 1 tablet (50 mg total) by mouth every 2 (two) hours as needed for migraine. May repeat in 2 hours if headache persists or recurs.(max 2 tab in 24 hour period), Disp: 10 tablet, Rfl: 0  EXAM:  VITALS per patient if applicable:  GENERAL: alert, oriented, appears well and in no acute distress  HEENT: atraumatic, conjunttiva clear, no obvious abnormalities on inspection of external nose and ears  NECK: normal movements of the head and neck  LUNGS: on inspection no signs of respiratory distress, breathing rate appears normal, no obvious gross SOB, gasping or wheezing  CV: no obvious cyanosis  MS: moves all visible extremities without noticeable abnormality  PSYCH/NEURO: pleasant and cooperative, no obvious depression or anxiety, speech and thought processing grossly intact  ASSESSMENT AND PLAN:  Discussed the following assessment and plan:  COVID-19  -we discussed possible serious and likely etiologies, options for evaluation and workup, limitations of telemedicine visit vs in person visit, treatment, treatment risks and precautions. Pt prefers to treat via telemedicine empirically rather than in person at this moment.  Did discuss treatment, potential complications, isolation and precautions.  He opted for continue over-the-counter symptomatic care this point, including analgesic if needed, nasal saline, short course nasal decongestant and over-the-counter cough medication. Work/School slipped offered: declined Scheduled follow up with PCP offered: Agrees to follow-up if needed Advised to seek prompt in person care if worsening, new symptoms arise, or if is not improving with treatment. Discussed options for inperson care if PCP office not available. Did  let this patient know that I only do telemedicine on Tuesdays and Thursdays for [place]. Advised to schedule follow up visit with PCP or [facility] if any further questions or concerns to avoid delays in  care.   I discussed the assessment and treatment plan with the patient. The patient was provided an opportunity to ask questions and all were answered. The patient agreed with the plan and demonstrated an understanding of the instructions.     Terressa Koyanagi, DO

## 2020-07-30 NOTE — Patient Instructions (Signed)
   HOME CARE TIPS:     -can use tylenol or aleve if needed for fevers, aches and pains per instructions  -can use nasal saline a few times per day if nasal congestion, sometime a short course of Afrin nasal spray for 3 days can help as well  -stay hydrated, drink plenty of fluids and eat small healthy meals - avoid dairy  -can take 1000 IU Vit D3 and Vit C lozenges per instructions   -follow up with your doctor in 2-3 days unless improving and feeling better  -stay home while sick, except to seek medical care, and if you have COVID19 please stay home for a full 10 days since the onset of symptoms PLUS one day of no fever and feeling better.  It was nice to meet you today, and I really hope you are feeling better soon. I help [facility] out with telemedicine visits on Tuesdays and Thursdays and am available for visits on those days. If you have any concerns or questions following this visit please schedule a follow up visit with your Primary Care doctor or seek care at a local urgent care clinic to avoid delays in care.    Seek in person care promptly if your symptoms worsen, new concerns arise or you are not improving with treatment. Call 911 and/or seek emergency care if you symptoms are severe or life threatening.

## 2020-09-20 ENCOUNTER — Other Ambulatory Visit: Payer: Self-pay | Admitting: Medical

## 2020-09-20 MED ORDER — PANTOPRAZOLE SODIUM 40 MG PO TBEC: 40.0000 mg | DELAYED_RELEASE_TABLET | Freq: Two times a day (BID) | ORAL | 3 refills | Status: DC

## 2020-09-25 ENCOUNTER — Other Ambulatory Visit: Payer: Self-pay

## 2020-09-26 ENCOUNTER — Other Ambulatory Visit: Payer: Self-pay

## 2020-09-26 ENCOUNTER — Telehealth: Payer: Self-pay | Admitting: Medical

## 2020-09-26 ENCOUNTER — Encounter: Payer: Self-pay | Admitting: Medical

## 2020-09-26 ENCOUNTER — Ambulatory Visit (INDEPENDENT_AMBULATORY_CARE_PROVIDER_SITE_OTHER): Payer: PRIVATE HEALTH INSURANCE | Admitting: Medical

## 2020-09-26 VITALS — BP 130/80 | HR 105 | Temp 98.4°F | Resp 18 | Ht 71.0 in | Wt 246.0 lb

## 2020-09-26 DIAGNOSIS — I10 Essential (primary) hypertension: Secondary | ICD-10-CM

## 2020-09-26 DIAGNOSIS — Z0000 Encounter for general adult medical examination without abnormal findings: Secondary | ICD-10-CM | POA: Diagnosis not present

## 2020-09-26 DIAGNOSIS — K635 Polyp of colon: Secondary | ICD-10-CM

## 2020-09-26 DIAGNOSIS — Z125 Encounter for screening for malignant neoplasm of prostate: Secondary | ICD-10-CM | POA: Diagnosis not present

## 2020-09-26 DIAGNOSIS — K219 Gastro-esophageal reflux disease without esophagitis: Secondary | ICD-10-CM

## 2020-09-26 DIAGNOSIS — Z113 Encounter for screening for infections with a predominantly sexual mode of transmission: Secondary | ICD-10-CM

## 2020-09-26 DIAGNOSIS — D75839 Thrombocytosis, unspecified: Secondary | ICD-10-CM

## 2020-09-26 LAB — PSA: PSA: 1.88 ng/mL (ref 0.10–4.00)

## 2020-09-26 LAB — COMPREHENSIVE METABOLIC PANEL
ALT: 27 U/L (ref 0–53)
AST: 28 U/L (ref 0–37)
Albumin: 4.3 g/dL (ref 3.5–5.2)
Alkaline Phosphatase: 39 U/L (ref 39–117)
BUN: 16 mg/dL (ref 6–23)
CO2: 30 mEq/L (ref 19–32)
Calcium: 9.7 mg/dL (ref 8.4–10.5)
Chloride: 102 mEq/L (ref 96–112)
Creatinine, Ser: 1.24 mg/dL (ref 0.40–1.50)
GFR: 67.63 mL/min (ref >60.00)
Glucose, Bld: 86 mg/dL (ref 70–99)
Potassium: 4.4 mEq/L (ref 3.5–5.1)
Sodium: 139 mEq/L (ref 135–145)
Total Bilirubin: 0.5 mg/dL (ref 0.2–1.2)
Total Protein: 7.2 g/dL (ref 6.0–8.3)

## 2020-09-26 LAB — CBC WITH DIFFERENTIAL/PLATELET
Basophils Absolute: 0 10*3/uL (ref 0.0–0.1)
Basophils Relative: 0.6 % (ref 0.0–3.0)
Eosinophils Absolute: 0.1 10*3/uL (ref 0.0–0.7)
Eosinophils Relative: 1.5 % (ref 0.0–5.0)
HCT: 45.2 % (ref 39.0–52.0)
Hemoglobin: 14.8 g/dL (ref 13.0–17.0)
Lymphocytes Relative: 48.7 % — ABNORMAL HIGH (ref 12.0–46.0)
Lymphs Abs: 3.8 10*3/uL (ref 0.7–4.0)
MCHC: 32.7 g/dL (ref 30.0–36.0)
MCV: 92 fl (ref 78.0–100.0)
Monocytes Absolute: 0.7 10*3/uL (ref 0.1–1.0)
Monocytes Relative: 8.7 % (ref 3.0–12.0)
Neutro Abs: 3.1 10*3/uL (ref 1.4–7.7)
Neutrophils Relative %: 40.5 % — ABNORMAL LOW (ref 43.0–77.0)
Platelets: 545 10*3/uL — ABNORMAL HIGH (ref 150.0–400.0)
RBC: 4.91 Mil/uL (ref 4.22–5.81)
RDW: 13.6 % (ref 11.5–15.5)
WBC: 7.7 10*3/uL (ref 4.0–10.5)

## 2020-09-26 LAB — LIPID PANEL
Cholesterol: 182 mg/dL (ref 0–200)
HDL: 53.7 mg/dL (ref >39.00)
LDL Cholesterol: 114 mg/dL — ABNORMAL HIGH (ref 0–99)
NonHDL: 128.7
Total CHOL/HDL Ratio: 3
Triglycerides: 76 mg/dL (ref 0.0–149.0)
VLDL: 15.2 mg/dL (ref 0.0–40.0)

## 2020-09-26 NOTE — Telephone Encounter (Signed)
I put in referral to GI. He saw  Dr. Havery Moros GI MD in the past. Do I need to put in referral again. [place] [facility].

## 2020-09-26 NOTE — Telephone Encounter (Signed)
Refer back to Dr. Havery Moros

## 2020-09-26 NOTE — Patient Instructions (Addendum)
For you wellness exam today I have ordered cbc, cmp, lipid panel and hiv.  Flu vaccine and covid declined.  Recommend exercise and healthy diet.  We will let you know lab results as they come in.  Follow up date appointment will be determined after lab review.    History of colon polyp so did go ahead and place referral to GI as you might be do.  Also history of GERD with daily mild to moderate symptoms despite use of Protonix.  So going to include on the referral evaluate and treat possible EGD.  History of hypertension and on repeat blood pressure check today blood pressure is controlled.  Continue losartan 25 mg daily.  Would recommend checking your blood pressure periodically to make sure BP less than 140/90.  I would like to see that your blood pressure is 130/80 or close to when you check.   Preventive Care 55-81 Years Old, Male Preventive care refers to lifestyle choices and visits with your health care provider that can promote health and wellness. This includes:  A yearly physical exam. This is also called an annual wellness visit.  Regular dental and eye exams.  Immunizations.  Screening for certain conditions.  Healthy lifestyle choices, such as: ? Eating a healthy diet. ? Getting regular exercise. ? Not using drugs or products that contain nicotine and tobacco. ? Limiting alcohol use. What can I expect for my preventive care visit? Physical exam Your health care provider will check your:  Height and weight. These may be used to calculate your BMI (body mass index). BMI is a measurement that tells if you are at a healthy weight.  Heart rate and blood pressure.  Body temperature.  Skin for abnormal spots. Counseling Your health care provider may ask you questions about your:  Past medical problems.  Family's medical history.  Alcohol, tobacco, and drug use.  Emotional well-being.  Home life and relationship well-being.  Sexual activity.  Diet,  exercise, and sleep habits.  Work and work Statistician.  Access to firearms. What immunizations do I need? Vaccines are usually given at various ages, according to a schedule. Your health care provider will recommend vaccines for you based on your age, medical history, and lifestyle or other factors, such as travel or where you work.   What tests do I need? Blood tests  Lipid and cholesterol levels. These may be checked every 5 years, or more often if you are over 66 years old.  Hepatitis C test.  Hepatitis B test. Screening  Lung cancer screening. You may have this screening every year starting at age 60 if you have a 30-pack-year history of smoking and currently smoke or have quit within the past 15 years.  Prostate cancer screening. Recommendations will vary depending on your family history and other risks.  Genital exam to check for testicular cancer or hernias.  Colorectal cancer screening. ? All adults should have this screening starting at age 42 and continuing until age 25. ? Your health care provider may recommend screening at age 41 if you are at increased risk. ? You will have tests every 1-10 years, depending on your results and the type of screening test.  Diabetes screening. ? This is done by checking your blood sugar (glucose) after you have not eaten for a while (fasting). ? You may have this done every 1-3 years.  STD (sexually transmitted disease) testing, if you are at risk. Follow these instructions at home: Eating and drinking  Eat a  diet that includes fresh fruits and vegetables, whole grains, lean protein, and low-fat dairy products.  Take vitamin and mineral supplements as recommended by your health care provider.  Do not drink alcohol if your health care provider tells you not to drink.  If you drink alcohol: ? Limit how much you have to 0-2 drinks a day. ? Be aware of how much alcohol is in your drink. In the [place] one drink equals one 12 oz  bottle of beer (355 mL), one 5 oz glass of wine (148 mL), or one 1 oz glass of hard liquor (44 mL).   Lifestyle  Take daily care of your teeth and gums. Brush your teeth every morning and night with fluoride toothpaste. Floss one time each day.  Stay active. Exercise for at least 30 minutes 5 or more days each week.  Do not use any products that contain nicotine or tobacco, such as cigarettes, e-cigarettes, and chewing tobacco. If you need help quitting, ask your health care provider.  Do not use drugs.  If you are sexually active, practice safe sex. Use a condom or other form of protection to prevent STIs (sexually transmitted infections).  If told by your health care provider, take low-dose aspirin daily starting at age 9.  Find healthy ways to cope with stress, such as: ? Meditation, yoga, or listening to music. ? Journaling. ? Talking to a trusted person. ? Spending time with friends and family. Safety  Always wear your seat belt while driving or riding in a vehicle.  Do not drive: ? If you have been drinking alcohol. Do not ride with someone who has been drinking. ? When you are tired or distracted. ? While texting.  Wear a helmet and other protective equipment during sports activities.  If you have firearms in your house, make sure you follow all gun safety procedures. What's next?  Go to your health care provider once a year for an annual wellness visit.  Ask your health care provider how often you should have your eyes and teeth checked.  Stay up to date on all vaccines. This information is not intended to replace advice given to you by your health care provider. Make sure you discuss any questions you have with your health care provider. Document Revised: 04/18/2019 Document Reviewed: 07/14/2018 Elsevier Patient Education  2021 [employer].

## 2020-09-26 NOTE — Telephone Encounter (Signed)
Referral to hematologist placed. °

## 2020-09-26 NOTE — Progress Notes (Signed)
Subjective:    Patient ID: William Fuller, male    DOB: Feb 17, 1971, 50 y.o.   MRN: [id number]  HPI  Pt in for wellness exam/cpe.  Pt is fasting.   Pt works for [employer]. Pt is exercising 3 days. Pt admits not eating well. Eating less since has 2 jobs. Pt is drinking 2 cups of coffee a day. Non smoker. Rare alcohol use.  Pt has not had covid vaccine. He had covid twice. Pt got covid 1st year and this likely omicron. He indicates does see need to get vaccine.  Declines flu vaccine.  Pt has hx of colonoscopy in 2018. Polyp found. In epic states tentative repeat 2028? With polyp not sure that is correct.   Pt has hx of gerd that is persist mildly. If were to slop he states would be severe     Review of Systems  Constitutional: Negative for chills, fatigue and fever.  HENT: Negative for congestion and drooling.   Respiratory: Negative for cough, chest tightness, shortness of breath and wheezing.   Cardiovascular: Negative for chest pain and palpitations.  Gastrointestinal: Positive for abdominal pain. Negative for abdominal distention, anal bleeding, diarrhea, nausea and vomiting.  Genitourinary: Negative for flank pain and frequency.  Musculoskeletal: Negative for back pain, myalgias and neck stiffness.  Skin: Negative for rash.  Neurological: Negative for dizziness, syncope, weakness, light-headedness, numbness and headaches.  Hematological: Negative for adenopathy. Does not bruise/bleed easily.  Psychiatric/Behavioral: Negative for behavioral problems, dysphoric mood, hallucinations, sleep disturbance and suicidal ideas. The patient is not nervous/anxious and is not hyperactive.     Past Medical History:  Diagnosis Date   Allergy    Esophageal varices (HCC)    Focal nodular hyperplasia of liver    GERD (gastroesophageal reflux disease)    Hypertension    Intestinal metaplasia of gastric mucosa      Social History   Socioeconomic History   Marital status:  Single    Spouse name: Not on file   Number of children: Not on file   Years of education: Not on file   Highest education level: Not on file  Occupational History   Not on file  Tobacco Use   Smoking status: Former Smoker    Types: Cigarettes    Quit date: 06/12/2015    Years since quitting: 5.2   Smokeless tobacco: Never Used  Vaping Use   Vaping Use: Never used  Substance and Sexual Activity   Alcohol use: No    Alcohol/week: 0.0 standard drinks   Drug use: Yes    Types: Marijuana    Comment: pt smokes twice a week. Each time 2 joints.   Sexual activity: Yes  Other Topics Concern   Not on file  Social History Narrative   Not on file   Social Determinants of Health   Financial Resource Strain: Not on file  Food Insecurity: Not on file  Transportation Needs: Not on file  Physical Activity: Not on file  Stress: Not on file  Social Connections: Not on file  Intimate Partner Violence: Not on file    Past Surgical History:  Procedure Laterality Date   HERNIA REPAIR      Family History  Problem Relation Age of Onset   Hypertension Other    CAD Other    Breast cancer Mother    Heart disease Mother    Heart disease Brother    Heart disease Maternal Grandmother    Hypertension Maternal Grandmother    Healthy  Father    Colon cancer Neg Hx     No Known Allergies  Current Outpatient Medications on File Prior to Visit  Medication Sig Dispense Refill   famotidine (PEPCID) 20 MG tablet TAKE 1 TO 2 TABLETS BY MOUTH ONCE DAILY 60 tablet 0   ibuprofen (ADVIL,MOTRIN) 600 MG tablet Take 1 tablet (600 mg total) by mouth every 6 (six) hours as needed. 30 tablet 0   losartan (COZAAR) 25 MG tablet Take 1 tablet (25 mg total) by mouth daily. 30 tablet 3   pantoprazole (PROTONIX) 40 MG tablet Take 1 tablet (40 mg total) by mouth 2 (two) times daily. 180 tablet 3   SUMAtriptan (IMITREX) 50 MG tablet Take 1 tablet (50 mg total) by mouth every 2 (two)  hours as needed for migraine. May repeat in 2 hours if headache persists or recurs.(max 2 tab in 24 hour period) 10 tablet 0   [DISCONTINUED] fluticasone (FLONASE) 50 MCG/ACT nasal spray Place 2 sprays into both nostrils daily. 16 g 0   [DISCONTINUED] sertraline (ZOLOFT) 25 MG tablet Take 1 tablet (25 mg total) by mouth daily. 30 tablet 3   No current facility-administered medications on file prior to visit.    BP (!) 147/91    Pulse (!) 105    Temp 98.4 F (36.9 C) (Oral)    Resp 18    Ht 5\' 11"  (1.803 m)    Wt 246 lb (111.6 kg)    SpO2 99%    BMI 34.31 kg/m       Objective:   Physical Exam  General Mental Status- Alert. General Appearance- Not in acute distress.   Skin General: Color- Normal Color. Moisture- Normal Moisture.  Neck Carotid Arteries- Normal color. Moisture- Normal Moisture. No carotid bruits. No JVD.  Chest and Lung Exam Auscultation: Breath Sounds:-Normal.  Cardiovascular Auscultation:Rythm- Regular. Murmurs & Other Heart Sounds:Auscultation of the heart reveals- No Murmurs.  Abdomen Inspection:-Inspeection Normal. Palpation/Percussion:Note:No mass. Palpation and Percussion of the abdomen reveal- Non Tender, Non Distended + BS, no rebound or guarding.    Neurologic Cranial Nerve exam:- CN III-XII intact(No nystagmus), symmetric smile. Strength:- 5/5 equal and symmetric strength both upper and lower extremities.      Assessment & Plan:  For you wellness exam today I have ordered cbc, cmp, lipid panel and hiv.  Flu vaccine and covid declined.  Recommend exercise and healthy diet.  We will let you know lab results as they come in.  Follow up date appointment will be determined after lab review.    History of colon polyp so did go ahead and place referral to GI as you might be do.  Also history of GERD with daily mild to moderate symptoms despite use of Protonix.  So going to include on the referral evaluate and treat possible EGD.  History  of hypertension and on repeat blood pressure check today blood pressure is controlled.  Continue losartan 25 mg daily.  Would recommend checking your blood pressure periodically to make sure BP less than 140/90.  I would like to see that your blood pressure is 130/80 or close to when you check.  [name], [place]   [id number] charge in addition to wellness exam as addressed history of colon polyp, persisting GERD and hypertension.

## 2020-09-27 ENCOUNTER — Telehealth: Payer: Self-pay | Admitting: *Deleted

## 2020-09-27 LAB — HIV ANTIBODY (ROUTINE TESTING W REFLEX): HIV 1&2 Ab, 4th Generation: NONREACTIVE

## 2020-09-27 NOTE — Telephone Encounter (Signed)
Referral forwarded to [facility] - per Roselyn Reef

## 2020-09-27 NOTE — Telephone Encounter (Signed)
Referral changed

## 2020-10-02 ENCOUNTER — Telehealth: Payer: Self-pay | Admitting: Hematology and Oncology

## 2020-10-02 NOTE — Telephone Encounter (Signed)
Received a new hem referral from [facility] at [facility] for thrombocytosis. Pt has been cld and scheduled to see Dr. Lorenso Courier on 3/10 at 9am. Pt aware to arrive 20 minutes early.

## 2020-10-10 ENCOUNTER — Inpatient Hospital Stay: Payer: PRIVATE HEALTH INSURANCE | Attending: Hematology and Oncology | Admitting: Hematology and Oncology

## 2020-10-10 ENCOUNTER — Encounter: Payer: Self-pay | Admitting: Hematology and Oncology

## 2020-10-10 ENCOUNTER — Inpatient Hospital Stay: Payer: PRIVATE HEALTH INSURANCE

## 2020-10-10 ENCOUNTER — Other Ambulatory Visit: Payer: Self-pay

## 2020-10-10 VITALS — BP 140/84 | HR 70 | Temp 98.0°F | Resp 17 | Ht 71.0 in | Wt 248.1 lb

## 2020-10-10 DIAGNOSIS — D75839 Thrombocytosis, unspecified: Secondary | ICD-10-CM | POA: Insufficient documentation

## 2020-10-10 DIAGNOSIS — F1290 Cannabis use, unspecified, uncomplicated: Secondary | ICD-10-CM | POA: Diagnosis not present

## 2020-10-10 DIAGNOSIS — Z8249 Family history of ischemic heart disease and other diseases of the circulatory system: Secondary | ICD-10-CM | POA: Diagnosis not present

## 2020-10-10 DIAGNOSIS — Z79899 Other long term (current) drug therapy: Secondary | ICD-10-CM | POA: Insufficient documentation

## 2020-10-10 DIAGNOSIS — I252 Old myocardial infarction: Secondary | ICD-10-CM | POA: Insufficient documentation

## 2020-10-10 DIAGNOSIS — Z87891 Personal history of nicotine dependence: Secondary | ICD-10-CM | POA: Diagnosis not present

## 2020-10-10 DIAGNOSIS — Z803 Family history of malignant neoplasm of breast: Secondary | ICD-10-CM | POA: Insufficient documentation

## 2020-10-10 DIAGNOSIS — D72820 Lymphocytosis (symptomatic): Secondary | ICD-10-CM | POA: Insufficient documentation

## 2020-10-10 DIAGNOSIS — R5383 Other fatigue: Secondary | ICD-10-CM | POA: Diagnosis not present

## 2020-10-10 LAB — CMP (CANCER CENTER ONLY)
ALT: 37 U/L (ref 0–44)
AST: 35 U/L (ref 15–41)
Albumin: 4 g/dL (ref 3.5–5.0)
Alkaline Phosphatase: 42 U/L (ref 38–126)
Anion gap: 6 (ref 5–15)
BUN: 15 mg/dL (ref 6–20)
CO2: 25 mmol/L (ref 22–32)
Calcium: 9.1 mg/dL (ref 8.9–10.3)
Chloride: 106 mmol/L (ref 98–111)
Creatinine: 1.11 mg/dL (ref 0.61–1.24)
GFR, Estimated: >60 mL/min (ref >60)
Glucose, Bld: 100 mg/dL — ABNORMAL HIGH (ref 70–99)
Potassium: 4 mmol/L (ref 3.5–5.1)
Sodium: 137 mmol/L (ref 135–145)
Total Bilirubin: 0.2 mg/dL — ABNORMAL LOW (ref 0.3–1.2)
Total Protein: 7.5 g/dL (ref 6.5–8.1)

## 2020-10-10 LAB — CBC WITH DIFFERENTIAL (CANCER CENTER ONLY)
Abs Immature Granulocytes: 0.02 10*3/uL (ref 0.00–0.07)
Basophils Absolute: 0.1 10*3/uL (ref 0.0–0.1)
Basophils Relative: 1 %
Eosinophils Absolute: 0.3 10*3/uL (ref 0.0–0.5)
Eosinophils Relative: 4 %
HCT: 43.2 % (ref 39.0–52.0)
Hemoglobin: 14.7 g/dL (ref 13.0–17.0)
Immature Granulocytes: 0 %
Lymphocytes Relative: 42 %
Lymphs Abs: 3.1 10*3/uL (ref 0.7–4.0)
MCH: 30.4 pg (ref 26.0–34.0)
MCHC: 34 g/dL (ref 30.0–36.0)
MCV: 89.3 fL (ref 80.0–100.0)
Monocytes Absolute: 0.5 10*3/uL (ref 0.1–1.0)
Monocytes Relative: 7 %
Neutro Abs: 3.4 10*3/uL (ref 1.7–7.7)
Neutrophils Relative %: 46 %
Platelet Count: 525 10*3/uL — ABNORMAL HIGH (ref 150–400)
RBC: 4.84 MIL/uL (ref 4.22–5.81)
RDW: 12.8 % (ref 11.5–15.5)
WBC Count: 7.4 10*3/uL (ref 4.0–10.5)
nRBC: 0 % (ref 0.0–0.2)

## 2020-10-10 LAB — IRON AND TIBC
Iron: 70 ug/dL (ref 42–163)
Saturation Ratios: 22 % (ref 20–55)
TIBC: 323 ug/dL (ref 202–409)
UIBC: 252 ug/dL (ref 117–376)

## 2020-10-10 LAB — FERRITIN: Ferritin: 220 ng/mL (ref 24–336)

## 2020-10-10 LAB — SEDIMENTATION RATE: Sed Rate: 2 mm/hr (ref 0–16)

## 2020-10-10 NOTE — Progress Notes (Signed)
[place] [phone]   Fax:([phone] [place] NOTE  Patient Care Team: [name], Hershal Coria as PCP - General (Internal Medicine)  Hematological/Oncological History # Thrombocytosis # Relative Lymphocytosis 10/18/2009: WBC 8.9, Hgb 15.5, MCV 93.5, Plt 578 (first CBC on record) 02/22/2015: WBC 5.9, Hgb 14.0, MCV 89.7, Plt 478 10/07/2018: WBC 7.2, Hgb 14.8, MCV 90.7, Plt 755 09/26/2020: WBC 7.7, Hgb 14.8, MCV 92, Plt 545 10/10/2020: establish care with Dr. Lorenso Courier   CHIEF COMPLAINTS/PURPOSE OF CONSULTATION:  "Thrombocytosis "  HISTORY OF PRESENTING ILLNESS:  William Fuller 50 y.o. male with medical history significant for GERD, HTN, allergies who presents for evaluation of longstanding thrombocytosis.   On review of the previous records William Fuller has a longstanding thrombocytosis dating back to at least 10/18/2009 at which time his platelet count was 578.  He had a maximum platelet count of 755 on 10/07/2018.  Most recently on 09/26/2020 the patient had a white blood cell count 7.7, hemoglobin 14.8, MCV of 92, and a platelet count of 434.  Of note he has never had an abnormal white blood cell count, hemoglobin, or MCV.  Due to concern for this longstanding thrombocytosis he was referred to hematology for further evaluation management.  On exam today William Fuller reports he is not quite sure why he is here today.  He reports that he had a "mild heart attack" in [place] back in 2006-2007 but was told here that he did not have a heart attack.  He notes that he previously lived in [place].  He also reports that his legs tend to bother him with sleep and takes about 30 minutes to get to sleep before the legs settle down.  He has never had any major surgeries other than dental extractions.  On further discussion he had a brother who had 4 heart attacks and his mother passed away of breast cancer.  He also had aunts that had unspecified cancer.  He has 2  children who are healthy and is currently single.  He is a former smoker and smoked for about 15 years and quit in 2002.  He does some recreational marijuana use.  Furthermore he endorses having tiredness, fatigue, but no bleeding, bruising, or dark stools.  He denies any fevers, chills, sweats, nausea, vomiting or diarrhea.  A full 10 point ROS is listed below.  MEDICAL HISTORY:  Past Medical History:  Diagnosis Date  . Allergy   . Esophageal varices ([place])   . Focal nodular hyperplasia of liver   . GERD (gastroesophageal reflux disease)   . Hypertension   . Intestinal metaplasia of gastric mucosa     SURGICAL HISTORY: Past Surgical History:  Procedure Laterality Date  . HERNIA REPAIR      SOCIAL HISTORY: Social History   Socioeconomic History  . Marital status: Single    Spouse name: Not on file  . Number of children: Not on file  . Years of education: Not on file  . Highest education level: Not on file  Occupational History  . Not on file  Tobacco Use  . Smoking status: Former Smoker    Types: Cigarettes    Quit date: 06/12/2015    Years since quitting: 5.3  . Smokeless tobacco: Never Used  Vaping Use  . Vaping Use: Never used  Substance and Sexual Activity  . Alcohol use: No    Alcohol/week: 0.0 standard drinks  . Drug use: Yes    Types: Marijuana    Comment:  pt smokes twice a week. Each time 2 joints.  . Sexual activity: Yes  Other Topics Concern  . Not on file  Social History Narrative  . Not on file   Social Determinants of Health   Financial Resource Strain: Not on file  Food Insecurity: Not on file  Transportation Needs: Not on file  Physical Activity: Not on file  Stress: Not on file  Social Connections: Not on file  Intimate Partner Violence: Not on file    FAMILY HISTORY: Family History  Problem Relation Age of Onset  . Hypertension Other   . CAD Other   . Breast cancer Mother   . Heart disease Mother   . Heart disease Brother   . Heart  disease Maternal Grandmother   . Hypertension Maternal Grandmother   . Healthy Father   . Colon cancer Neg Hx     ALLERGIES:  has No Known Allergies.  MEDICATIONS:  Current Outpatient Medications  Medication Sig Dispense Refill  . famotidine (PEPCID) 20 MG tablet TAKE 1 TO 2 TABLETS BY MOUTH ONCE DAILY (Patient not taking: Reported on 10/10/2020) 60 tablet 0  . ibuprofen (ADVIL,MOTRIN) 600 MG tablet Take 1 tablet (600 mg total) by mouth every 6 (six) hours as needed. 30 tablet 0  . losartan (COZAAR) 25 MG tablet Take 1 tablet (25 mg total) by mouth daily. 30 tablet 3  . pantoprazole (PROTONIX) 40 MG tablet Take 1 tablet (40 mg total) by mouth 2 (two) times daily. 180 tablet 3  . SUMAtriptan (IMITREX) 50 MG tablet Take 1 tablet (50 mg total) by mouth every 2 (two) hours as needed for migraine. May repeat in 2 hours if headache persists or recurs.(max 2 tab in 24 hour period) 10 tablet 0   No current facility-administered medications for this visit.    REVIEW OF SYSTEMS:   Constitutional: ( - ) fevers, ( - )  chills , ( - ) night sweats Eyes: ( - ) blurriness of vision, ( - ) double vision, ( - ) watery eyes Ears, nose, mouth, throat, and face: ( - ) mucositis, ( - ) sore throat Respiratory: ( - ) cough, ( - ) dyspnea, ( - ) wheezes Cardiovascular: ( - ) palpitation, ( - ) chest discomfort, ( - ) lower extremity swelling Gastrointestinal:  ( - ) nausea, ( - ) heartburn, ( - ) change in bowel habits Skin: ( - ) abnormal skin rashes Lymphatics: ( - ) new lymphadenopathy, ( - ) easy bruising Neurological: ( - ) numbness, ( - ) tingling, ( - ) new weaknesses Behavioral/Psych: ( - ) mood change, ( - ) new changes  All other systems were reviewed with the patient and are negative.  PHYSICAL EXAMINATION:  Vitals:   10/10/20 0913  BP: 140/84  Pulse: 70  Resp: 17  Temp: 98 F (36.7 C)  SpO2: 100%   Filed Weights   10/10/20 0913  Weight: 248 lb 1.6 oz (112.5 kg)    GENERAL: well  appearing middle aged [place] American male in NAD  SKIN: skin color, texture, turgor are normal, no rashes or significant lesions EYES: conjunctiva are pink and non-injected, sclera clear LUNGS: clear to auscultation and percussion with normal breathing effort HEART: regular rate & rhythm and no murmurs and no lower extremity edema Musculoskeletal: no cyanosis of digits and no clubbing  PSYCH: alert & oriented x 3, fluent speech NEURO: no focal motor/sensory deficits  LABORATORY DATA:  I have reviewed the data as  listed CBC Latest Ref Rng & Units 09/26/2020 10/25/2018 10/07/2018  WBC 4.0 - 10.5 K/uL 7.7 5.9 7.2  Hemoglobin 13.0 - 17.0 g/dL 14.8 14.6 14.8  Hematocrit 39.0 - 52.0 % 45.2 44.8 44.7  Platelets 150.0 - 400.0 K/uL 545.0(H) 565(H) 755.0(H)    CMP Latest Ref Rng & Units 09/26/2020 10/25/2018 10/07/2018  Glucose 70 - 99 mg/dL 86 110(H) 82  BUN 6 - 23 mg/dL '16 8 12  ' Creatinine 0.40 - 1.50 mg/dL 1.24 1.14 1.12  Sodium 135 - 145 mEq/L 139 141 137  Potassium 3.5 - 5.1 mEq/L 4.4 4.3 4.8  Chloride 96 - 112 mEq/L 102 104 100  CO2 19 - 32 mEq/L '30 29 31  ' Calcium 8.4 - 10.5 mg/dL 9.7 8.9 9.6  Total Protein 6.0 - 8.3 g/dL 7.2 7.0 7.1  Total Bilirubin 0.2 - 1.2 mg/dL 0.5 0.6 0.5  Alkaline Phos 39 - 117 U/L 39 37(L) 41  AST 0 - 37 U/L '28 21 20  ' ALT 0 - 53 U/L '27 20 18    ' RADIOGRAPHIC STUDIES: No results found.  ASSESSMENT & PLAN William Fuller 50 y.o. male with medical history significant for GERD, HTN, allergies who presents for evaluation of longstanding thrombocytosis.   After review the labs, the records, schedule the patient the findings most consistent with a longstanding thrombocytosis ranging anywhere from 460 up to nearly 800.  This is been present for approximately 11 years.  Given these findings I do believe this is most likely a myeloproliferative neoplasm.  We will order a JAK2 panel with reflex as well as a BCR able fish in order to help sort this out.  Alternative causes  could include inflammation or iron deficiency and therefore we will order those labs today as well.  In the interim while awaiting for these labs return I would recommend that the patient begin aspirin 81 mg p.o. daily.  In the event he is found to have a driver mutation we will need to perform a bone marrow biopsy in order to confirm the diagnosis.  The patient voiced his understanding of the plan moving forward.  # Thrombocytosis # Relative Lymphocytosis --findings have been present since at least 2011, concerning for a longstanding MPN --will order JAK2 panel w/ reflex and BCR/ABL FISH --additionally will order iron panel, ferritin, and ESR --if above labs show a driver mutation the patient will need a bone marrow biopsy --given his age and lack of prior VTEs he would be considered low risk and could be managed on ASA 63m PO alone --RTC scheduled for 3 months, sooner if Bmbx is required.   No orders of the defined types were placed in this encounter.   All questions were answered. The patient knows to call the clinic with any problems, questions or concerns.  A total of more than 60 minutes were spent on this encounter and over half of that time was spent on counseling and coordination of care as outlined above.   JLedell Peoples MD Department of Hematology/Oncology [facility] [facility] HospitalPhone: [phone]: [phone]: [email or web address]  10/10/2020 9:17 AM

## 2020-10-14 ENCOUNTER — Telehealth: Payer: Self-pay | Admitting: Hematology and Oncology

## 2020-10-14 NOTE — Telephone Encounter (Signed)
Scheduled per los. Called and left msg. Mailed printout  °

## 2020-10-17 LAB — BCR ABL1 FISH (GENPATH)

## 2020-10-17 LAB — JAK2 (INCLUDING V617F AND EXON 12), MPL,& CALR W/RFL MPN PANEL (NGS)

## 2020-10-18 ENCOUNTER — Telehealth: Payer: Self-pay | Admitting: *Deleted

## 2020-10-18 NOTE — Telephone Encounter (Signed)
TCT patient regarding recent lab results.  Spoke with patient and advised that his labs did not reveal a cause for his elevated platelet count. Pt is to return in 3 months for repeat labs and MD visit. Pt to continue to take his daily baby aspirin. Pt voiced understanding to the above.

## 2020-10-18 NOTE — Telephone Encounter (Signed)
-----   Message from Orson Slick, MD sent at 10/17/2020 11:44 AM EDT ----- Please let William Fuller know that we did not find a clear cause for his high platelet count. He had normal iron levels, no inflammation, and no evidence of a genetic mutation. We will see him back in clinic in 3 months to re-evaluate.  ----- Message ----- From: [place]: 10/10/2020  10:04 AM EDT To: Orson Slick, MD

## 2020-12-01 ENCOUNTER — Encounter: Payer: Self-pay | Admitting: Medical

## 2020-12-03 ENCOUNTER — Other Ambulatory Visit: Payer: Self-pay

## 2020-12-03 ENCOUNTER — Ambulatory Visit (INDEPENDENT_AMBULATORY_CARE_PROVIDER_SITE_OTHER): Payer: PRIVATE HEALTH INSURANCE | Admitting: Medical

## 2020-12-03 VITALS — BP 134/82 | HR 65 | Resp 20 | Ht 71.0 in | Wt 238.6 lb

## 2020-12-03 DIAGNOSIS — S29011A Strain of muscle and tendon of front wall of thorax, initial encounter: Secondary | ICD-10-CM

## 2020-12-03 DIAGNOSIS — M25511 Pain in right shoulder: Secondary | ICD-10-CM | POA: Diagnosis not present

## 2020-12-03 DIAGNOSIS — S46819A Strain of other muscles, fascia and tendons at shoulder and upper arm level, unspecified arm, initial encounter: Secondary | ICD-10-CM

## 2020-12-03 MED ORDER — CYCLOBENZAPRINE HCL 10 MG PO TABS: 10.0000 mg | ORAL_TABLET | Freq: Every day | ORAL | 0 refills | Status: DC

## 2020-12-03 MED ORDER — DICLOFENAC SODIUM 75 MG PO TBEC: 75.0000 mg | DELAYED_RELEASE_TABLET | Freq: Two times a day (BID) | ORAL | 0 refills | Status: DC

## 2020-12-03 NOTE — Progress Notes (Signed)
Subjective:    Patient ID: William Fuller, male    DOB: 07/23/1971, 50 y.o.   MRN: [id number]  HPI  Pt in for fall off ladder on Saturday. He was clearning gutters on house.  He was actually on the roof of one story home. He was slight dizzy for a second lost balance and fell. When he landed on grass he tried to break fall with rt arm. Since then his rt shoulder and upper chest hurts. Pain on rom motion of shoulder and rt pectoralis sore. Today some left trapezius area pain.  Pt tried muscle relaxer later in the day and that helped when it was in his system.  Pt is rt handed. Pt tried to work second job but required lifting moderate heavy items so could not work.  Review of Systems  Constitutional: Negative for chills, fatigue and fever.  HENT: Negative for congestion and ear pain.   Respiratory: Negative for cough, chest tightness, shortness of breath and wheezing.   Cardiovascular: Negative for chest pain and palpitations.  Musculoskeletal: Negative for back pain, joint swelling and neck pain.       See hpi.  Skin: Negative for rash.  Neurological: Negative for dizziness, speech difficulty, weakness, numbness and headaches.  Hematological: Negative for adenopathy. Does not bruise/bleed easily.  Psychiatric/Behavioral: Negative for behavioral problems and confusion. The patient is not nervous/anxious.     Past Medical History:  Diagnosis Date  . Allergy   . Esophageal varices ([place])   . Focal nodular hyperplasia of liver   . GERD (gastroesophageal reflux disease)   . Hypertension   . Intestinal metaplasia of gastric mucosa      Social History   Socioeconomic History  . Marital status: Single    Spouse name: Not on file  . Number of children: Not on file  . Years of education: Not on file  . Highest education level: Not on file  Occupational History  . Not on file  Tobacco Use  . Smoking status: Former Smoker    Types: Cigarettes    Quit date: 06/12/2015    Years  since quitting: 5.4  . Smokeless tobacco: Never Used  Vaping Use  . Vaping Use: Never used  Substance and Sexual Activity  . Alcohol use: No    Alcohol/week: 0.0 standard drinks  . Drug use: Yes    Types: Marijuana    Comment: pt smokes twice a week. Each time 2 joints.  . Sexual activity: Yes  Other Topics Concern  . Not on file  Social History Narrative  . Not on file   Social Determinants of Health   Financial Resource Strain: Not on file  Food Insecurity: Not on file  Transportation Needs: Not on file  Physical Activity: Not on file  Stress: Not on file  Social Connections: Not on file  Intimate Partner Violence: Not on file    Past Surgical History:  Procedure Laterality Date  . HERNIA REPAIR      Family History  Problem Relation Age of Onset  . Hypertension Other   . CAD Other   . Breast cancer Mother   . Heart disease Mother   . Heart disease Brother   . Heart disease Maternal Grandmother   . Hypertension Maternal Grandmother   . Healthy Father   . Colon cancer Neg Hx     No Known Allergies  Current Outpatient Medications on File Prior to Visit  Medication Sig Dispense Refill  . losartan (COZAAR) 25 MG  tablet Take 1 tablet (25 mg total) by mouth daily. 30 tablet 3  . pantoprazole (PROTONIX) 40 MG tablet Take 1 tablet (40 mg total) by mouth 2 (two) times daily. 180 tablet 3  . SUMAtriptan (IMITREX) 50 MG tablet Take 1 tablet (50 mg total) by mouth every 2 (two) hours as needed for migraine. May repeat in 2 hours if headache persists or recurs.(max 2 tab in 24 hour period) 10 tablet 0  . [DISCONTINUED] fluticasone (FLONASE) 50 MCG/ACT nasal spray Place 2 sprays into both nostrils daily. 16 g 0  . [DISCONTINUED] sertraline (ZOLOFT) 25 MG tablet Take 1 tablet (25 mg total) by mouth daily. 30 tablet 3   No current facility-administered medications on file prior to visit.    BP 134/82   Pulse 65   Resp 20   Ht 5\' 11"  (1.803 m)   Wt 238 lb 9.6 oz (108.2  kg)   SpO2 98%   BMI 33.28 kg/m       Objective:   Physical Exam  General- No acute distress. Pleasant patient. Neck- Full range of motion, no jvd. No mid cspine tenderness. Bilateral trapezius tender on palpation. Left side more than rt side. Lungs- Clear, even and unlabored. Heart- regular rate and rhythm. Neurologic- CNII- XII grossly intact.  Rt shoulder- anterior aspect tender to palpation Good rom but pain on abduction.  Rt clavicle and left clavicle no pain on palpation. Rt upper outer pectoralis tenderness to palpatin.  Rt side ribs- no pain on palpation at all.        Assessment & Plan:  For rt shoulder pain and rt pectoralis pain since fall will give diclofenac rx and flexeril rx. Muscle relaxant to use at night.  Since your one of your jobs does not allow light duty will refer to sports medicine in attempt to expedite recovery and assess injury. I don't think bone injury but do think soft tissue type injury.   If delay in referral and shoulder pain persists then can go ahead and get xray later this week friday. Making available to do but not necessary immediately.  Follow up date to be determined after sports med evaluation.  [name], PA-C

## 2020-12-03 NOTE — Patient Instructions (Addendum)
For rt shoulder pain and rt pectoralis pain since fall will give diclofenac rx and flexeril rx. Muscle relaxant to use at night.  Since your one of your jobs does not allow light duty will refer to sports medicine in attempt to expedite recovery and assess injury. I don't think bone injury but do think soft tissue type injury.   If delay in referral and shoulder pain persists then can go ahead and get xray later this week friday. Making available to do but not necessary immediately.  Follow up date to be determined after sports med evaluation.  William Fuller

## 2020-12-05 ENCOUNTER — Other Ambulatory Visit: Payer: Self-pay

## 2020-12-05 ENCOUNTER — Encounter: Payer: Self-pay | Admitting: Family Medicine

## 2020-12-05 ENCOUNTER — Ambulatory Visit: Payer: Self-pay

## 2020-12-05 ENCOUNTER — Ambulatory Visit (INDEPENDENT_AMBULATORY_CARE_PROVIDER_SITE_OTHER): Payer: PRIVATE HEALTH INSURANCE | Admitting: Family Medicine

## 2020-12-05 ENCOUNTER — Encounter: Payer: Self-pay | Admitting: Medical

## 2020-12-05 VITALS — BP 116/78 | Ht 71.0 in | Wt 238.0 lb

## 2020-12-05 DIAGNOSIS — M7551 Bursitis of right shoulder: Secondary | ICD-10-CM | POA: Insufficient documentation

## 2020-12-05 DIAGNOSIS — M25511 Pain in right shoulder: Secondary | ICD-10-CM

## 2020-12-05 DIAGNOSIS — M778 Other enthesopathies, not elsewhere classified: Secondary | ICD-10-CM | POA: Diagnosis not present

## 2020-12-05 MED ORDER — PREDNISONE 5 MG PO TABS: ORAL_TABLET | ORAL | 0 refills | Status: DC

## 2020-12-05 NOTE — Progress Notes (Signed)
William Fuller - 50 y.o. male MRN [id number]  Date of birth: Apr 24, 1971  SUBJECTIVE:  Including CC & ROS.  No chief complaint on file.   William Fuller is a 50 y.o. male that is presenting with acute right shoulder pain.  He had a fall off of his referral cleaning the gutters a few days ago.  Having some stiffness and pain anteriorly and posteriorly.  No history of dislocation.  Has had injections into the shoulder previously.   Review of Systems See HPI   HISTORY: Past Medical, Surgical, Social, and Family History Reviewed & Updated per EMR.   Pertinent Historical Findings include:  Past Medical History:  Diagnosis Date  . Allergy   . Esophageal varices ([place])   . Focal nodular hyperplasia of liver   . GERD (gastroesophageal reflux disease)   . Hypertension   . Intestinal metaplasia of gastric mucosa     Past Surgical History:  Procedure Laterality Date  . HERNIA REPAIR      Family History  Problem Relation Age of Onset  . Hypertension Other   . CAD Other   . Breast cancer Mother   . Heart disease Mother   . Heart disease Brother   . Heart disease Maternal Grandmother   . Hypertension Maternal Grandmother   . Healthy Father   . Colon cancer Neg Hx     Social History   Socioeconomic History  . Marital status: Single    Spouse name: Not on file  . Number of children: Not on file  . Years of education: Not on file  . Highest education level: Not on file  Occupational History  . Not on file  Tobacco Use  . Smoking status: Former Smoker    Types: Cigarettes    Quit date: 06/12/2015    Years since quitting: 5.4  . Smokeless tobacco: Never Used  Vaping Use  . Vaping Use: Never used  Substance and Sexual Activity  . Alcohol use: No    Alcohol/week: 0.0 standard drinks  . Drug use: Yes    Types: Marijuana    Comment: pt smokes twice a week. Each time 2 joints.  . Sexual activity: Yes  Other Topics Concern  . Not on file  Social History Narrative  . Not  on file   Social Determinants of Health   Financial Resource Strain: Not on file  Food Insecurity: Not on file  Transportation Needs: Not on file  Physical Activity: Not on file  Stress: Not on file  Social Connections: Not on file  Intimate Partner Violence: Not on file     PHYSICAL EXAM:  VS: BP 116/78 (BP Location: Left Arm, Patient Position: Sitting, Cuff Size: Large)   Ht 5\' 11"  (1.803 m)   Wt 238 lb (108 kg)   BMI 33.19 kg/m  Physical Exam Gen: NAD, alert, cooperative with exam, well-appearing MSK:  Right shoulder: Normal range of motion. Limited external rotation. Normal abduction. Pain with empty can test. Positive O'Brien's test. Neurovascular intact  Limited ultrasound: Right shoulder:  No change biceps tendon long and short axis. Normal subscapularis. Normal-appearing supraspinatus with mild subacromial bursitis. Mild degenerative changes of the [facility] joint. No change appreciated the posterior glenohumeral joint.  Summary: Mild subacromial bursitis.  Ultrasound and interpretation by Clearance Coots, MD    ASSESSMENT & PLAN:   Capsulitis of right shoulder Having some stiffness after his fall.  No signs of labral tear or rotator cuff tear on clinic or ultrasound exam. -Counseled on home  exercise therapy and supportive care. -Prednisone. -Could consider imaging or physical therapy or injection.

## 2020-12-05 NOTE — Assessment & Plan Note (Signed)
Having some stiffness after his fall.  No signs of labral tear or rotator cuff tear on clinic or ultrasound exam. -Counseled on home exercise therapy and supportive care. -Prednisone. -Could consider imaging or physical therapy or injection.

## 2020-12-05 NOTE — Patient Instructions (Signed)
Nice to meet you Please try heat to loosen the area up.  Please use ice after work or exercise   Please try the stretches  Please send me a message in [place] with any questions or updates.  Please see me back in 4 weeks.   --Dr. Raeford Razor

## 2021-01-02 ENCOUNTER — Ambulatory Visit: Payer: PRIVATE HEALTH INSURANCE | Admitting: Family Medicine

## 2021-01-02 NOTE — Progress Notes (Deleted)
  William Fuller - 50 y.o. male MRN [id number]  Date of birth: 07-20-1971  SUBJECTIVE:  Including CC & ROS.  No chief complaint on file.   William Fuller is a 50 y.o. male that is  ***.  ***   Review of Systems See HPI   HISTORY: Past Medical, Surgical, Social, and Family History Reviewed & Updated per EMR.   Pertinent Historical Findings include:  Past Medical History:  Diagnosis Date  . Allergy   . Esophageal varices ([place])   . Focal nodular hyperplasia of liver   . GERD (gastroesophageal reflux disease)   . Hypertension   . Intestinal metaplasia of gastric mucosa     Past Surgical History:  Procedure Laterality Date  . HERNIA REPAIR      Family History  Problem Relation Age of Onset  . Hypertension Other   . CAD Other   . Breast cancer Mother   . Heart disease Mother   . Heart disease Brother   . Heart disease Maternal Grandmother   . Hypertension Maternal Grandmother   . Healthy Father   . Colon cancer Neg Hx     Social History   Socioeconomic History  . Marital status: Single    Spouse name: Not on file  . Number of children: Not on file  . Years of education: Not on file  . Highest education level: Not on file  Occupational History  . Not on file  Tobacco Use  . Smoking status: Former Smoker    Types: Cigarettes    Quit date: 06/12/2015    Years since quitting: 5.5  . Smokeless tobacco: Never Used  Vaping Use  . Vaping Use: Never used  Substance and Sexual Activity  . Alcohol use: No    Alcohol/week: 0.0 standard drinks  . Drug use: Yes    Types: Marijuana    Comment: pt smokes twice a week. Each time 2 joints.  . Sexual activity: Yes  Other Topics Concern  . Not on file  Social History Narrative  . Not on file   Social Determinants of Health   Financial Resource Strain: Not on file  Food Insecurity: Not on file  Transportation Needs: Not on file  Physical Activity: Not on file  Stress: Not on file  Social Connections: Not on file   Intimate Partner Violence: Not on file     PHYSICAL EXAM:  VS: There were no vitals taken for this visit. Physical Exam Gen: NAD, alert, cooperative with exam, well-appearing MSK:  ***      ASSESSMENT & PLAN:   No problem-specific Assessment & Plan notes found for this encounter.

## 2021-01-13 ENCOUNTER — Inpatient Hospital Stay: Payer: PRIVATE HEALTH INSURANCE | Attending: Hematology and Oncology | Admitting: Hematology and Oncology

## 2021-01-13 ENCOUNTER — Inpatient Hospital Stay: Payer: PRIVATE HEALTH INSURANCE

## 2021-01-13 ENCOUNTER — Other Ambulatory Visit: Payer: Self-pay | Admitting: Hematology and Oncology

## 2021-01-13 ENCOUNTER — Other Ambulatory Visit: Payer: Self-pay

## 2021-01-13 ENCOUNTER — Encounter: Payer: Self-pay | Admitting: Hematology and Oncology

## 2021-01-13 VITALS — BP 134/72 | HR 107 | Temp 99.2°F | Resp 19 | Wt 242.8 lb

## 2021-01-13 DIAGNOSIS — F1290 Cannabis use, unspecified, uncomplicated: Secondary | ICD-10-CM | POA: Diagnosis not present

## 2021-01-13 DIAGNOSIS — Z803 Family history of malignant neoplasm of breast: Secondary | ICD-10-CM | POA: Diagnosis not present

## 2021-01-13 DIAGNOSIS — D75839 Thrombocytosis, unspecified: Secondary | ICD-10-CM

## 2021-01-13 DIAGNOSIS — Z8249 Family history of ischemic heart disease and other diseases of the circulatory system: Secondary | ICD-10-CM | POA: Diagnosis not present

## 2021-01-13 DIAGNOSIS — D72820 Lymphocytosis (symptomatic): Secondary | ICD-10-CM | POA: Insufficient documentation

## 2021-01-13 DIAGNOSIS — K921 Melena: Secondary | ICD-10-CM | POA: Diagnosis not present

## 2021-01-13 DIAGNOSIS — Z79899 Other long term (current) drug therapy: Secondary | ICD-10-CM | POA: Insufficient documentation

## 2021-01-13 DIAGNOSIS — R519 Headache, unspecified: Secondary | ICD-10-CM | POA: Insufficient documentation

## 2021-01-13 LAB — CBC WITH DIFFERENTIAL (CANCER CENTER ONLY)
Abs Immature Granulocytes: 0.03 10*3/uL (ref 0.00–0.07)
Basophils Absolute: 0 10*3/uL (ref 0.0–0.1)
Basophils Relative: 1 %
Eosinophils Absolute: 0.1 10*3/uL (ref 0.0–0.5)
Eosinophils Relative: 2 %
HCT: 45.9 % (ref 39.0–52.0)
Hemoglobin: 15.4 g/dL (ref 13.0–17.0)
Immature Granulocytes: 1 %
Lymphocytes Relative: 46 %
Lymphs Abs: 3.1 10*3/uL (ref 0.7–4.0)
MCH: 30.4 pg (ref 26.0–34.0)
MCHC: 33.6 g/dL (ref 30.0–36.0)
MCV: 90.5 fL (ref 80.0–100.0)
Monocytes Absolute: 0.4 10*3/uL (ref 0.1–1.0)
Monocytes Relative: 7 %
Neutro Abs: 2.8 10*3/uL (ref 1.7–7.7)
Neutrophils Relative %: 43 %
Platelet Count: 539 10*3/uL — ABNORMAL HIGH (ref 150–400)
RBC: 5.07 MIL/uL (ref 4.22–5.81)
RDW: 13.2 % (ref 11.5–15.5)
WBC Count: 6.5 10*3/uL (ref 4.0–10.5)
nRBC: 0 % (ref 0.0–0.2)

## 2021-01-13 LAB — CMP (CANCER CENTER ONLY)
ALT: 24 U/L (ref 0–44)
AST: 21 U/L (ref 15–41)
Albumin: 4.1 g/dL (ref 3.5–5.0)
Alkaline Phosphatase: 42 U/L (ref 38–126)
Anion gap: 8 (ref 5–15)
BUN: 11 mg/dL (ref 6–20)
CO2: 27 mmol/L (ref 22–32)
Calcium: 9.3 mg/dL (ref 8.9–10.3)
Chloride: 106 mmol/L (ref 98–111)
Creatinine: 1.21 mg/dL (ref 0.61–1.24)
GFR, Estimated: >60 mL/min (ref >60)
Glucose, Bld: 108 mg/dL — ABNORMAL HIGH (ref 70–99)
Potassium: 4.1 mmol/L (ref 3.5–5.1)
Sodium: 141 mmol/L (ref 135–145)
Total Bilirubin: 0.5 mg/dL (ref 0.3–1.2)
Total Protein: 7.7 g/dL (ref 6.5–8.1)

## 2021-01-13 NOTE — Progress Notes (Signed)
[facility] [phone]   [phone]  PROGRESS NOTE  Patient Care Team: [name] as PCP - General (Internal Medicine)  Hematological/Oncological History # Thrombocytosis # Relative Lymphocytosis 10/18/2009: WBC 8.9, Hgb 15.5, MCV 93.5, Plt 578 (first CBC on record) 02/22/2015: WBC 5.9, Hgb 14.0, MCV 89.7, Plt 478 10/07/2018: WBC 7.2, Hgb 14.8, MCV 90.7, Plt 755 09/26/2020: WBC 7.7, Hgb 14.8, MCV 92, Plt 545 10/10/2020: establish care with Dr. Lorenso Courier. Plt 525 01/13/2021: Plt 539   Interval History:  William Fuller 50 y.o. William with medical history significant for thrombocytosis who presents for a follow up visit. The patient's last visit was on 10/10/2020 at which time he established care. In the interim since the last visit William Fuller has had no changes in his health.   On exam today William Fuller reports that he has not had any major changes in his health since her last visit.  He does continue to have headaches but he thinks this may be due to having 2 full-time jobs and working quite hard.  He denies having issues with bleeding, bruising, or dark stools.  He does occasionally have some bright red blood in the stool.  He is not prone to constipation but does note that he has hemorrhoids.  He reports that his energy is "crappy".  He notes that he has never had his spleen removed and Surgeries had his hernia surgery.  He otherwise denies any fevers, chills, sweats, nausea, vomiting or diarrhea.  A full 10 point ROS is listed below.  MEDICAL HISTORY:  Past Medical History:  Diagnosis Date   Allergy    Esophageal varices (HCC)    Focal nodular hyperplasia of liver    GERD (gastroesophageal reflux disease)    Hypertension    Intestinal metaplasia of gastric mucosa     SURGICAL HISTORY: Past Surgical History:  Procedure Laterality Date   HERNIA REPAIR      SOCIAL HISTORY: Social History   Socioeconomic History   Marital status:  Single    Spouse name: Not on file   Number of children: Not on file   Years of education: Not on file   Highest education level: Not on file  Occupational History   Not on file  Tobacco Use   Smoking status: Former    Pack years: 0.00    Types: Cigarettes    Quit date: 06/12/2015    Years since quitting: 5.5   Smokeless tobacco: Never  Vaping Use   Vaping Use: Never used  Substance and Sexual Activity   Alcohol use: No    Alcohol/week: 0.0 standard drinks   Drug use: Yes    Types: Marijuana    Comment: pt smokes twice a week. Each time 2 joints.   Sexual activity: Yes  Other Topics Concern   Not on file  Social History Narrative   Not on file   Social Determinants of Health   Financial Resource Strain: Not on file  Food Insecurity: Not on file  Transportation Needs: Not on file  Physical Activity: Not on file  Stress: Not on file  Social Connections: Not on file  Intimate Partner Violence: Not on file    FAMILY HISTORY: Family History  Problem Relation Age of Onset   Hypertension Other    CAD Other    Breast cancer Mother    Heart disease Mother    Heart disease Brother    Heart disease Maternal Grandmother    Hypertension Maternal Grandmother  Healthy Father    Colon cancer Neg Hx     ALLERGIES:  has No Known Allergies.  MEDICATIONS:  Current Outpatient Medications  Medication Sig Dispense Refill   cyclobenzaprine (FLEXERIL) 10 MG tablet Take 1 tablet (10 mg total) by mouth at bedtime. 7 tablet 0   diclofenac (VOLTAREN) 75 MG EC tablet Take 1 tablet (75 mg total) by mouth 2 (two) times daily. 20 tablet 0   losartan (COZAAR) 25 MG tablet Take 1 tablet (25 mg total) by mouth daily. 30 tablet 3   pantoprazole (PROTONIX) 40 MG tablet Take 1 tablet (40 mg total) by mouth 2 (two) times daily. 180 tablet 3   predniSONE (DELTASONE) 5 MG tablet Take 6 pills for first day, 5 pills second day, 4 pills third day, 3 pills fourth day, 2 pills the fifth day, and 1  pill sixth day. 21 tablet 0   SUMAtriptan (IMITREX) 50 MG tablet Take 1 tablet (50 mg total) by mouth every 2 (two) hours as needed for migraine. May repeat in 2 hours if headache persists or recurs.(max 2 tab in 24 hour period) 10 tablet 0   No current facility-administered medications for this visit.    REVIEW OF SYSTEMS:   Constitutional: ( - ) fevers, ( - )  chills , ( - ) night sweats Eyes: ( - ) blurriness of vision, ( - ) double vision, ( - ) watery eyes Ears, nose, mouth, throat, and face: ( - ) mucositis, ( - ) sore throat Respiratory: ( - ) cough, ( - ) dyspnea, ( - ) wheezes Cardiovascular: ( - ) palpitation, ( - ) chest discomfort, ( - ) lower extremity swelling Gastrointestinal:  ( - ) nausea, ( - ) heartburn, ( - ) change in bowel habits Skin: ( - ) abnormal skin rashes Lymphatics: ( - ) new lymphadenopathy, ( - ) easy bruising Neurological: ( - ) numbness, ( - ) tingling, ( - ) new weaknesses Behavioral/Psych: ( - ) mood change, ( - ) new changes  All other systems were reviewed with the patient and are negative.  PHYSICAL EXAMINATION: Vitals:   01/13/21 1508  BP: 134/72  Pulse: (!) 107  Resp: 19  Temp: 99.2 F (37.3 C)  SpO2: 100%   Filed Weights   01/13/21 1508  Weight: 242 lb 12.8 oz (110.1 kg)    GENERAL: Well-appearing middle-aged African-American William ,alert, no distress and comfortable SKIN: skin color, texture, turgor are normal, no rashes or significant lesions EYES: conjunctiva are pink and non-injected, sclera clear LUNGS: clear to auscultation and percussion with normal breathing effort HEART: regular rate & rhythm and no murmurs and no lower extremity edema Musculoskeletal: no cyanosis of digits and no clubbing  PSYCH: alert & oriented x 3, fluent speech NEURO: no focal motor/sensory deficits  LABORATORY DATA:  I have reviewed the data as listed CBC Latest Ref Rng & Units 01/13/2021 10/10/2020 09/26/2020  WBC 4.0 - 10.5 K/uL 6.5 7.4 7.7   Hemoglobin 13.0 - 17.0 g/dL 15.4 14.7 14.8  Hematocrit 39.0 - 52.0 % 45.9 43.2 45.2  Platelets 150 - 400 K/uL 539(H) 525(H) 545.0(H)    CMP Latest Ref Rng & Units 01/13/2021 10/10/2020 09/26/2020  Glucose 70 - 99 mg/dL 108(H) 100(H) 86  BUN 6 - 20 mg/dL '11 15 16  ' Creatinine 0.61 - 1.24 mg/dL 1.21 1.11 1.24  Sodium 135 - 145 mmol/L 141 137 139  Potassium 3.5 - 5.1 mmol/L 4.1 4.0 4.4  Chloride 98 -  111 mmol/L 106 106 102  CO2 22 - 32 mmol/L '27 25 30  ' Calcium 8.9 - 10.3 mg/dL 9.3 9.1 9.7  Total Protein 6.5 - 8.1 g/dL 7.7 7.5 7.2  Total Bilirubin 0.3 - 1.2 mg/dL 0.5 0.2(L) 0.5  Alkaline Phos 38 - 126 U/L 42 42 39  AST 15 - 41 U/L 21 35 28  ALT 0 - 44 U/L 24 37 27    RADIOGRAPHIC STUDIES: No results found.  ASSESSMENT & PLAN Latoya Diskin 50 y.o. William with medical history significant for thrombocytosis who presents for a follow up visit.   At this time there is no clear explanation for the patient's thrombocytosis.  He has no evidence of an MPN, no evidence of inflammation, and no evidence of iron deficiency.  These of the 3 primary causes of thrombocytosis.  Additionally the patient is never had a splenectomy.  I would recommend because we are unclear on the cause that he continue his aspirin 81 mg p.o. daily.  The patient voices understanding of this plan.  We will plan to see him back in 6 months time for labs and 12 months time for repeat clinic visit.  # Thrombocytosis, stable.  # Relative Lymphocytosis, stable --findings have been present since at least 2011,however JAK2 panel w/ reflex and BCR/ABL FISH were negative.  -- iron panel, ferritin, and ESR were unremarkable --no sign of a driver mutation the patient, it does not appear that a bone marrow biopsy is necessary.  --recommend continuing ASA 5m PO at this time.  --RTC in 6 months labs and 12 months clinic visit for continued monitoring  Orders Placed This Encounter  Procedures   CBC with Differential ([facility]  Only)    Standing Status:   Standing    Number of Occurrences:   2    Standing Expiration Date:   01/13/2022    All questions were answered. The patient knows to call the clinic with any problems, questions or concerns.  A total of more than 30 minutes were spent on this encounter with face-to-face time and non-face-to-face time, including preparing to see the patient, ordering tests and/or medications, counseling the patient and coordination of care as outlined above.   JLedell Peoples MD Department of Hematology/Oncology [facility] IncPhone: [phone]: [phone]: [email or web address]  01/13/2021 3:29 PM

## 2021-01-16 ENCOUNTER — Telehealth: Payer: Self-pay | Admitting: Hematology and Oncology

## 2021-01-16 NOTE — Telephone Encounter (Signed)
Scheduled per los. Called, not able to leave msg. Mailed printout  

## 2021-07-02 ENCOUNTER — Ambulatory Visit: Payer: PRIVATE HEALTH INSURANCE | Admitting: Medical

## 2021-07-07 ENCOUNTER — Ambulatory Visit: Payer: PRIVATE HEALTH INSURANCE | Admitting: Medical

## 2021-07-08 ENCOUNTER — Ambulatory Visit (INDEPENDENT_AMBULATORY_CARE_PROVIDER_SITE_OTHER): Payer: Self-pay | Admitting: Family Medicine

## 2021-07-08 ENCOUNTER — Ambulatory Visit (INDEPENDENT_AMBULATORY_CARE_PROVIDER_SITE_OTHER): Payer: Self-pay | Admitting: Family

## 2021-07-08 ENCOUNTER — Encounter: Payer: Self-pay | Admitting: Family Medicine

## 2021-07-08 ENCOUNTER — Encounter: Payer: Self-pay | Admitting: Family

## 2021-07-08 VITALS — BP 130/96 | Ht 70.0 in | Wt 254.0 lb

## 2021-07-08 DIAGNOSIS — M7551 Bursitis of right shoulder: Secondary | ICD-10-CM

## 2021-07-08 DIAGNOSIS — M778 Other enthesopathies, not elsewhere classified: Secondary | ICD-10-CM

## 2021-07-08 DIAGNOSIS — M7712 Lateral epicondylitis, left elbow: Secondary | ICD-10-CM

## 2021-07-08 HISTORY — DX: Lateral epicondylitis, left elbow: M77.12

## 2021-07-08 MED ORDER — MELOXICAM 7.5 MG PO TABS: 7.5000 mg | ORAL_TABLET | Freq: Two times a day (BID) | ORAL | 1 refills | Status: DC | PRN

## 2021-07-08 MED ORDER — PREDNISONE 5 MG PO TABS: ORAL_TABLET | ORAL | 0 refills | Status: DC

## 2021-07-08 NOTE — Progress Notes (Signed)
  William Fuller - 50 y.o. male MRN [id number]  Date of birth: [birth year]  SUBJECTIVE:  Including CC & ROS.  No chief complaint on file.   William Fuller is a 50 y.o. male that is presenting with acute on chronic right shoulder pain and acute left elbow pain.  He has been doing creative activities at work as he did exacerbate his pain.  He is having pain with the left elbow anytime that he lifts anything.    Review of Systems See HPI   HISTORY: Past Medical, Surgical, Social, and Family History Reviewed & Updated per EMR.   Pertinent Historical Findings include:  Past Medical History:  Diagnosis Date   Allergy    Esophageal varices ([place])    Focal nodular hyperplasia of liver    GERD (gastroesophageal reflux disease)    Hypertension    Intestinal metaplasia of gastric mucosa    Lateral epicondylitis of left elbow 07/08/2021    Past Surgical History:  Procedure Laterality Date   HERNIA REPAIR      Family History  Problem Relation Age of Onset   Hypertension Other    CAD Other    Breast cancer Mother    Heart disease Mother    Heart disease Brother    Heart disease Maternal Grandmother    Hypertension Maternal Grandmother    Healthy Father    Colon cancer Neg Hx     Social History   Socioeconomic History   Marital status: Single    Spouse name: Not on file   Number of children: Not on file   Years of education: Not on file   Highest education level: Not on file  Occupational History   Not on file  Tobacco Use   Smoking status: Former    Types: Cigarettes    Quit date: 06/12/2015    Years since quitting: 6.0   Smokeless tobacco: Never  Vaping Use   Vaping Use: Never used  Substance and Sexual Activity   Alcohol use: No    Alcohol/week: 0.0 standard drinks   Drug use: Yes    Types: Marijuana    Comment: pt smokes twice a week. Each time 2 joints.   Sexual activity: Yes  Other Topics Concern   Not on file  Social History Narrative   Not on file    Social Determinants of Health   Financial Resource Strain: Not on file  Food Insecurity: Not on file  Transportation Needs: Not on file  Physical Activity: Not on file  Stress: Not on file  Social Connections: Not on file  Intimate Partner Violence: Not on file     PHYSICAL EXAM:  VS: BP (!) 130/96 (BP Location: Left Arm, Patient Position: Sitting)   Ht 5\' 10"  (1.778 m)   Wt 254 lb (115.2 kg)   BMI 36.45 kg/m  Physical Exam Gen: NAD, alert, cooperative with exam, well-appearing    ASSESSMENT & PLAN:   Lateral epicondylitis of left elbow Acutely occurring.  Has been doing repetitive activities that are likely the source of his pain. -Counseled on home exercise therapy and supportive care -Can take prednisone. -Counseled on meloxicam. -Could consider nitro or injection.  Subacromial bursitis of right shoulder joint Symptoms were consistent with bursitis -Counseled on home exercise therapy and supportive care. -Mobic. -Could consider injection physical therapy.

## 2021-07-08 NOTE — Assessment & Plan Note (Signed)
Symptoms were consistent with bursitis -Counseled on home exercise therapy and supportive care. -Mobic. -Could consider injection physical therapy.

## 2021-07-08 NOTE — Patient Instructions (Signed)
Good to see you Please use ice as needed  Please use the prednisone and then you can use the mobic after the prednisone is finished. You can use the mobic as needed  Please try the exercises   Please send me a message in MyChart with any questions or updates.  Please see me back in 4-6 weeks.   --Dr. Raeford Razor

## 2021-07-08 NOTE — Assessment & Plan Note (Addendum)
Acutely occurring.  Has been doing repetitive activities that are likely the source of his pain. -Counseled on home exercise therapy and supportive care -Can take prednisone. -Counseled on meloxicam. -Could consider nitro or injection.

## 2021-07-08 NOTE — Progress Notes (Signed)
OV cancelled- patient see his sports medicine provider today/ he cannot afford to pay for 2 visits out of pocket today.

## 2021-07-17 ENCOUNTER — Inpatient Hospital Stay: Payer: Self-pay | Attending: Hematology and Oncology

## 2021-08-12 ENCOUNTER — Ambulatory Visit: Payer: Managed Care, Other (non HMO) | Admitting: Family Medicine

## 2021-08-12 ENCOUNTER — Ambulatory Visit (HOSPITAL_BASED_OUTPATIENT_CLINIC_OR_DEPARTMENT_OTHER)
Admission: RE | Admit: 2021-08-12 | Discharge: 2021-08-12 | Disposition: A | Discharge status: Home / Self Care | Payer: Managed Care, Other (non HMO) | Source: Ambulatory Visit | Attending: Family Medicine | Admitting: Family Medicine

## 2021-08-12 ENCOUNTER — Other Ambulatory Visit: Payer: Self-pay

## 2021-08-12 ENCOUNTER — Encounter: Payer: Self-pay | Admitting: Family Medicine

## 2021-08-12 VITALS — BP 128/78 | Ht 70.0 in | Wt 254.0 lb

## 2021-08-12 DIAGNOSIS — M5412 Radiculopathy, cervical region: Secondary | ICD-10-CM

## 2021-08-12 MED ORDER — HYDROCODONE-ACETAMINOPHEN 5-325 MG PO TABS: 1.0000 | ORAL_TABLET | Freq: Three times a day (TID) | ORAL | 0 refills | Status: DC | PRN

## 2021-08-12 NOTE — Progress Notes (Signed)
°  William Fuller - 51 y.o. male MRN [id number]  Date of birth: [birth year]  SUBJECTIVE:  Including CC & ROS.  No chief complaint on file.   William Fuller is a 51 y.o. male that is presenting with more left arm pain and altered sensation.  The pain seems to be occurring more at night.  He is able to work with no issues.  His symptoms caused him to lose sleep.  He has paresthesia in his hands intermittently.  Review of the right shoulder x-ray from 2017 shows no acute changes.   Review of Systems See HPI   HISTORY: Past Medical, Surgical, Social, and Family History Reviewed & Updated per EMR.   Pertinent Historical Findings include:  Past Medical History:  Diagnosis Date   Allergy    Esophageal varices (HCC)    Focal nodular hyperplasia of liver    GERD (gastroesophageal reflux disease)    Hypertension    Intestinal metaplasia of gastric mucosa    Lateral epicondylitis of left elbow 07/08/2021    Past Surgical History:  Procedure Laterality Date   HERNIA REPAIR       PHYSICAL EXAM:  VS: BP 128/78 (BP Location: Left Arm, Patient Position: Sitting)    Ht 5\' 10"  (1.778 m)    Wt 254 lb (115.2 kg)    BMI 36.45 kg/m  Physical Exam Gen: NAD, alert, cooperative with exam, well-appearing MSK:  Neurovascularly intact       ASSESSMENT & PLAN:   Cervical radiculopathy Symptoms are more consistent with radicular type pain in the left arm.  Less likely to be ulnar nerve related. -Counseled on home exercise therapy and supportive care. -X-ray. Lebron Quam. -Could consider further imaging or physical therapy.

## 2021-08-12 NOTE — Patient Instructions (Signed)
Good to see you Please try heat  Please try the exercises  I will call with the results from today  Please use the pain medicine at night   Please send me a message in MyChart with any questions or updates.  Please see me back in 4 weeks.   --Dr. Raeford Razor

## 2021-08-12 NOTE — Assessment & Plan Note (Signed)
Symptoms are more consistent with radicular type pain in the left arm.  Less likely to be ulnar nerve related. -Counseled on home exercise therapy and supportive care. -X-ray. William Fuller. -Could consider further imaging or physical therapy.

## 2021-08-14 ENCOUNTER — Telehealth: Payer: Self-pay | Admitting: Family Medicine

## 2021-08-14 NOTE — Telephone Encounter (Signed)
Left VM for patient. If he calls back please have him speak with a nurse/CMA and inform that his xrays are showing minimal degenerative changes.   If any questions then please take the best time and phone number to call and I will try to call him back.   Rosemarie Ax, MD [facility] 08/14/2021, 1:06 PM

## 2021-09-09 ENCOUNTER — Ambulatory Visit (INDEPENDENT_AMBULATORY_CARE_PROVIDER_SITE_OTHER): Payer: Managed Care, Other (non HMO) | Admitting: Family Medicine

## 2021-09-09 ENCOUNTER — Encounter: Payer: Self-pay | Admitting: Family Medicine

## 2021-09-09 VITALS — BP 132/82 | Ht 70.0 in | Wt 254.0 lb

## 2021-09-09 DIAGNOSIS — M5412 Radiculopathy, cervical region: Secondary | ICD-10-CM

## 2021-09-09 DIAGNOSIS — M7712 Lateral epicondylitis, left elbow: Secondary | ICD-10-CM | POA: Diagnosis not present

## 2021-09-09 MED ORDER — DICLOFENAC SODIUM 2 % EX SOLN: 1.0000 "application " | Freq: Two times a day (BID) | CUTANEOUS | 2 refills | Status: DC

## 2021-09-09 NOTE — Progress Notes (Signed)
°  William Fuller - 51 y.o. male MRN [id number]  Date of birth: [birth year]  SUBJECTIVE:  Including CC & ROS.  No chief complaint on file.   William Fuller is a 51 y.o. male that is following up for his right sided arm pain and left elbow pain.  His left elbow is causing him more pain at this time.  He tends the hit it intermittently.  He still has intermittent paresthesia in the right hand but the pain is improving.   Review of Systems See HPI   HISTORY: Past Medical, Surgical, Social, and Family History Reviewed & Updated per EMR.   Pertinent Historical Findings include:  Past Medical History:  Diagnosis Date   Allergy    Esophageal varices (HCC)    Focal nodular hyperplasia of liver    GERD (gastroesophageal reflux disease)    Hypertension    Intestinal metaplasia of gastric mucosa    Lateral epicondylitis of left elbow 07/08/2021    Past Surgical History:  Procedure Laterality Date   HERNIA REPAIR       PHYSICAL EXAM:  VS: BP 132/82 (BP Location: Left Arm, Patient Position: Sitting)    Ht 5\' 10"  (1.778 m)    Wt 254 lb (115.2 kg)    BMI 36.45 kg/m  Physical Exam Gen: NAD, alert, cooperative with exam, well-appearing MSK:  Neurovascularly intact       ASSESSMENT & PLAN:   Cervical radiculopathy Improving.  Has intermittent paresthesia of the right hand. -Counseled on home exercise therapy and supportive care. -Could consider further imaging or physical therapy.  Lateral epicondylitis of left elbow Acutely worsening.  Reports having repetitive trauma to the area. -Counseled on home exercise therapy and supportive care. -Pennsaid. -Could consider injection.

## 2021-09-09 NOTE — Assessment & Plan Note (Signed)
Acutely worsening.  Reports having repetitive trauma to the area. -Counseled on home exercise therapy and supportive care. -Pennsaid. -Could consider injection.

## 2021-09-09 NOTE — Patient Instructions (Signed)
Good to see you Please try the rub on medicine  Please try the exercises   Please send me a message in MyChart with any questions or updates.  Please see me back in 4-6 weeks.   --Dr. Raeford Razor

## 2021-09-09 NOTE — Assessment & Plan Note (Signed)
Improving.  Has intermittent paresthesia of the right hand. -Counseled on home exercise therapy and supportive care. -Could consider further imaging or physical therapy.

## 2021-10-07 ENCOUNTER — Ambulatory Visit: Payer: Managed Care, Other (non HMO) | Admitting: Family Medicine

## 2021-11-11 ENCOUNTER — Ambulatory Visit: Payer: Managed Care, Other (non HMO) | Admitting: Medical

## 2021-11-17 ENCOUNTER — Ambulatory Visit: Payer: Managed Care, Other (non HMO) | Admitting: Family Medicine

## 2021-12-01 ENCOUNTER — Other Ambulatory Visit: Payer: Self-pay | Admitting: Medical

## 2021-12-01 MED ORDER — DICLOFENAC SODIUM 75 MG PO TBEC: 75.0000 mg | DELAYED_RELEASE_TABLET | Freq: Two times a day (BID) | ORAL | 0 refills | Status: DC

## 2021-12-31 ENCOUNTER — Emergency Department (HOSPITAL_COMMUNITY): Payer: Self-pay

## 2021-12-31 ENCOUNTER — Other Ambulatory Visit: Payer: Self-pay

## 2021-12-31 ENCOUNTER — Emergency Department (HOSPITAL_COMMUNITY)
Admission: EM | Admit: 2021-12-31 | Discharge: 2022-01-01 | Disposition: A | Discharge status: Home / Self Care | Payer: Self-pay | Attending: Student | Admitting: Student

## 2021-12-31 DIAGNOSIS — S93401A Sprain of unspecified ligament of right ankle, initial encounter: Secondary | ICD-10-CM

## 2021-12-31 DIAGNOSIS — R55 Syncope and collapse: Secondary | ICD-10-CM | POA: Insufficient documentation

## 2021-12-31 DIAGNOSIS — Z87891 Personal history of nicotine dependence: Secondary | ICD-10-CM | POA: Insufficient documentation

## 2021-12-31 DIAGNOSIS — M25571 Pain in right ankle and joints of right foot: Secondary | ICD-10-CM | POA: Insufficient documentation

## 2021-12-31 DIAGNOSIS — Z79899 Other long term (current) drug therapy: Secondary | ICD-10-CM | POA: Insufficient documentation

## 2021-12-31 DIAGNOSIS — I10 Essential (primary) hypertension: Secondary | ICD-10-CM | POA: Insufficient documentation

## 2021-12-31 LAB — BASIC METABOLIC PANEL
Anion gap: 6 (ref 5–15)
BUN: 10 mg/dL (ref 6–20)
CO2: 27 mmol/L (ref 22–32)
Calcium: 9.2 mg/dL (ref 8.9–10.3)
Chloride: 106 mmol/L (ref 98–111)
Creatinine, Ser: 1.18 mg/dL (ref 0.61–1.24)
GFR, Estimated: >60 mL/min (ref >60)
Glucose, Bld: 94 mg/dL (ref 70–99)
Potassium: 3.9 mmol/L (ref 3.5–5.1)
Sodium: 139 mmol/L (ref 135–145)

## 2021-12-31 LAB — CBC WITH DIFFERENTIAL/PLATELET
Abs Immature Granulocytes: 0.02 10*3/uL (ref 0.00–0.07)
Basophils Absolute: 0 10*3/uL (ref 0.0–0.1)
Basophils Relative: 1 %
Eosinophils Absolute: 0.1 10*3/uL (ref 0.0–0.5)
Eosinophils Relative: 1 %
HCT: 45.7 % (ref 39.0–52.0)
Hemoglobin: 14.9 g/dL (ref 13.0–17.0)
Immature Granulocytes: 0 %
Lymphocytes Relative: 34 %
Lymphs Abs: 2.7 10*3/uL (ref 0.7–4.0)
MCH: 29.9 pg (ref 26.0–34.0)
MCHC: 32.6 g/dL (ref 30.0–36.0)
MCV: 91.8 fL (ref 80.0–100.0)
Monocytes Absolute: 0.6 10*3/uL (ref 0.1–1.0)
Monocytes Relative: 7 %
Neutro Abs: 4.7 10*3/uL (ref 1.7–7.7)
Neutrophils Relative %: 57 %
Platelets: 522 10*3/uL — ABNORMAL HIGH (ref 150–400)
RBC: 4.98 MIL/uL (ref 4.22–5.81)
RDW: 13.2 % (ref 11.5–15.5)
WBC: 8.1 10*3/uL (ref 4.0–10.5)
nRBC: 0 % (ref 0.0–0.2)

## 2021-12-31 LAB — URINALYSIS, ROUTINE W REFLEX MICROSCOPIC
Bilirubin Urine: NEGATIVE
Glucose, UA: NEGATIVE mg/dL
Hgb urine dipstick: NEGATIVE
Ketones, ur: 5 mg/dL — AB
Leukocytes,Ua: NEGATIVE
Nitrite: NEGATIVE
Protein, ur: NEGATIVE mg/dL
Specific Gravity, Urine: 1.023 (ref 1.005–1.030)
pH: 6 (ref 5.0–8.0)

## 2021-12-31 NOTE — ED Provider Triage Note (Signed)
Emergency Medicine Provider Triage Evaluation Note  William Fuller , a 51 y.o. male  was evaluated in triage.  Pt complains of right foot pain after a syncopal episode.  Patient states that he was drinking coffee this afternoon when he felt that some "went down the wrong way".  Patient felt that he was going to vomit and went to the bathroom and had a coughing fit.  He had a syncopal episode at this time and does not know how he ended up injuring his right foot.  Pain is in the right foot/possibly ankle.  Patient states that hurts to bear weight patient states that he has had syncopal episode like this before but has never been evaluated  Review of Systems  Positive: Syncope, right foot pain Negative: Chest pain, shortness of breath  Physical Exam  BP (!) 152/94 (BP Location: Right Arm)   Pulse 85   Temp 98.3 F (36.8 C) (Oral)   Resp 19   SpO2 96%  Gen:   Awake, no distress   Resp:  Normal effort  MSK:   Moves extremities without difficulty  Other:    Medical Decision Making  Medically screening exam initiated at 8:46 PM.  Appropriate orders placed.  Charleton Deyoung was informed that the remainder of the evaluation will be completed by another provider, this initial triage assessment does not replace that evaluation, and the importance of remaining in the ED until their evaluation is complete.     Dorothyann Peng, PA-C 12/31/21 2048

## 2021-12-31 NOTE — ED Triage Notes (Signed)
Pt here for R foot pain. Pt had a syncopal episode today and fell, woke up w/ R foot pain and is unable to bear weight. Pt states he got dizzy and felt like he was going to throw up, then remembers waking up on the floor.

## 2022-01-01 MED ORDER — NAPROXEN 375 MG PO TABS: 375.0000 mg | ORAL_TABLET | Freq: Two times a day (BID) | ORAL | 0 refills | Status: DC

## 2022-01-01 MED ORDER — NAPROXEN 250 MG PO TABS
500.0000 mg | ORAL_TABLET | Freq: Once | ORAL | Status: AC
  Administered 2022-01-01: 500 mg via ORAL
  Filled 2022-01-01: qty 2

## 2022-01-01 NOTE — ED Provider Notes (Signed)
[facility] EMERGENCY DEPARTMENT Provider Note  CSN: [id number] Arrival date & time: 12/31/21 2009  Chief Complaint(s) Foot Pain and Loss of Consciousness  HPI William Fuller is a 51 y.o. male chronic thrombocytosis, HTN, GERD who presents emergency department for evaluation of a syncopal event and right ankle pain.  Patient states that he was drinking coffee this morning when a small amount of coffee went into his trachea and he had a coughing fit that progressed into posttussive emesis.  He then woke up on the ground.  His partner then found him on the ground and states that he returned to normal mental status baseline within 2 to 3 minutes of this happening.  Patient states that he has significant right ankle pain since the fall but denies headache, numbness, tingling, weakness or other neurologic or traumatic complaints.   Past Medical History Past Medical History:  Diagnosis Date   Allergy    Esophageal varices (HCC)    Focal nodular hyperplasia of liver    GERD (gastroesophageal reflux disease)    Hypertension    Intestinal metaplasia of gastric mucosa    Lateral epicondylitis of left elbow 07/08/2021   Patient Active Problem List   Diagnosis Date Noted   Cervical radiculopathy 08/12/2021   Lateral epicondylitis of left elbow 07/08/2021   Subacromial bursitis of right shoulder joint 12/05/2020   Thrombocytosis 10/21/2018   Bilateral shoulder pain 09/15/2017   Home Medication(s) Prior to Admission medications   Medication Sig Start Date End Date Taking? Authorizing Provider  cyclobenzaprine (FLEXERIL) 10 MG tablet Take 1 tablet (10 mg total) by mouth at bedtime. Patient not taking: Reported on 07/08/2021 12/03/20   Saguier, Percell Miller, PA-C  diclofenac (VOLTAREN) 75 MG EC tablet Take 1 tablet (75 mg total) by mouth 2 (two) times daily. 12/01/21   Saguier, Percell Miller, PA-C  Diclofenac Sodium (PENNSAID) 2 % SOLN Place 1 application onto the skin 2 (two) times daily.  09/09/21   Rosemarie Ax, MD  HYDROcodone-acetaminophen (NORCO/VICODIN) 5-325 MG tablet Take 1 tablet by mouth every 8 (eight) hours as needed. 08/12/21   Rosemarie Ax, MD  losartan (COZAAR) 25 MG tablet Take 1 tablet (25 mg total) by mouth daily. 04/11/20   Saguier, Percell Miller, PA-C  meloxicam (MOBIC) 7.5 MG tablet Take 1 tablet (7.5 mg total) by mouth 2 (two) times daily as needed. 07/08/21   Rosemarie Ax, MD  pantoprazole (PROTONIX) 40 MG tablet Take 1 tablet (40 mg total) by mouth 2 (two) times daily. Patient not taking: Reported on 07/08/2021 09/20/20   Saguier, Percell Miller, PA-C  predniSONE (DELTASONE) 5 MG tablet Take 6 pills for first day, 5 pills second day, 4 pills third day, 3 pills fourth day, 2 pills the fifth day, and 1 pill sixth day. 07/08/21   Marrian Salvage, FNP  SUMAtriptan (IMITREX) 50 MG tablet Take 1 tablet (50 mg total) by mouth every 2 (two) hours as needed for migraine. May repeat in 2 hours if headache persists or recurs.(max 2 tab in 24 hour period) 08/12/18   Saguier, Percell Miller, PA-C  fluticasone [facility]) 50 MCG/ACT nasal spray Place 2 sprays into both nostrils daily. 09/27/18 06/15/19  Melynda Ripple, MD  sertraline (ZOLOFT) 25 MG tablet Take 1 tablet (25 mg total) by mouth daily. 02/24/19 06/15/19  Saguier, Percell Miller, PA-C  Past Surgical History Past Surgical History:  Procedure Laterality Date   HERNIA REPAIR     Family History Family History  Problem Relation Age of Onset   Hypertension Other    CAD Other    Breast cancer Mother    Heart disease Mother    Heart disease Brother    Heart disease Maternal Grandmother    Hypertension Maternal Grandmother    Healthy Father    Colon cancer Neg Hx     Social History Social History   Tobacco Use   Smoking status: Former    Types: Cigarettes    Quit date: 06/12/2015    Years since  quitting: 6.5   Smokeless tobacco: Never  Vaping Use   Vaping Use: Never used  Substance Use Topics   Alcohol use: No    Alcohol/week: 0.0 standard drinks   Drug use: Yes    Types: Marijuana    Comment: pt smokes twice a week. Each time 2 joints.   Allergies Patient has no known allergies.  Review of Systems Review of Systems  Musculoskeletal:  Positive for arthralgias and joint swelling.  Neurological:  Positive for syncope.   Physical Exam Vital Signs  I have reviewed the triage vital signs BP (!) 149/94 (BP Location: Left Arm)   Pulse 84   Temp 98.9 F (37.2 C)   Resp 16   SpO2 98%   Physical Exam Constitutional:      General: He is not in acute distress.    Appearance: Normal appearance.  HENT:     Head: Normocephalic and atraumatic.     Nose: No congestion or rhinorrhea.  Eyes:     General:        Right eye: No discharge.        Left eye: No discharge.     Extraocular Movements: Extraocular movements intact.     Pupils: Pupils are equal, round, and reactive to light.  Cardiovascular:     Rate and Rhythm: Normal rate and regular rhythm.     Heart sounds: No murmur heard. Pulmonary:     Effort: No respiratory distress.     Breath sounds: No wheezing or rales.  Abdominal:     General: There is no distension.     Tenderness: There is no abdominal tenderness.  Musculoskeletal:        General: Swelling and tenderness (Right ankle) present. Normal range of motion.     Cervical back: Normal range of motion.  Skin:    General: Skin is warm and dry.  Neurological:     General: No focal deficit present.     Mental Status: He is alert.    ED Results and Treatments Labs (all labs ordered are listed, but only abnormal results are displayed) Labs Reviewed  CBC WITH DIFFERENTIAL/PLATELET - Abnormal; Notable for the following components:      Result Value   Platelets 522 (*)    All other components within normal limits  URINALYSIS, ROUTINE W REFLEX MICROSCOPIC  - Abnormal; Notable for the following components:   Ketones, ur 5 (*)    All other components within normal limits  BASIC METABOLIC PANEL  Radiology DG Ankle Complete Right  Result Date: 12/31/2021 CLINICAL DATA:  Fall, pain EXAM: RIGHT ANKLE - COMPLETE 3+ VIEW COMPARISON:  None Available. FINDINGS: Bone fragments are noted a off the anterior aspect of the navicular and adjacent cuneiform on the lateral view. No tibial or fibular fracture. Ankle joint spaces maintained. Soft tissues are intact. IMPRESSION: Walls fragments off the anterior surface of the navicular and adjacent cuneiform. Electronically Signed   By: Rolm Baptise M.D.   On: 12/31/2021 21:32   DG Foot Complete Right  Result Date: 12/31/2021 CLINICAL DATA:  Fall, pain EXAM: RIGHT FOOT COMPLETE - 3+ VIEW COMPARISON:  None Available. FINDINGS: There are avulsion fractures off the anterior aspect of the navicular and adjacent medial cuneiform. No additional fracture. No subluxation or dislocation. Soft tissues are intact. IMPRESSION: Avulsion fragments off the anterior aspect of the navicular and medial cuneiform Electronically Signed   By: Rolm Baptise M.D.   On: 12/31/2021 21:33    Pertinent labs & imaging results that were available during my care of the patient were reviewed by me and considered in my medical decision making (see MDM for details).  Medications Ordered in ED Medications - No data to display                                                                                                                                   Procedures Procedures  (including critical care time)  Medical Decision Making / ED Course   This patient presents to the ED for concern of syncope, ankle pain, this involves an extensive number of treatment options, and is a complaint that carries with it a high risk of  complications and morbidity.  The differential diagnosis includes vasovagal syncope, orthostatic syncope, fracture, ankle sprain  MDM: Patient seen emergency room for evaluation of syncope and ankle pain.  Physical exam reveals swelling and tenderness to the right ankle but pulses intact with no focal motor or sensory deficits.  Laboratory evaluation unremarkable outside of an elevated platelet count which is chronic for this patient x-ray with avulsion fractures to the navicular and cuneiform bones on the right consistent with a fairly severe ankle sprain.  Patient presentation appears consistent with vasovagal syncope in the setting of a coughing fit with posttussive emesis.  Very low suspicion for cardiogenic syncope at this time.  Ace wrap placed, patient placed in an Aircast and given crutches.  Patient already follows with a sports medicine physician and thus he will call to set up follow-up.  Patient then discharged with Naprosyn.   Additional history obtained: -Additional history obtained from partner -External records from outside source obtained and reviewed including: Chart review including previous notes, labs, imaging, consultation notes   Lab Tests: -I ordered, reviewed, and interpreted labs.   The pertinent results include:   Labs Reviewed  CBC WITH DIFFERENTIAL/PLATELET - Abnormal; Notable for the following components:  Result Value   Platelets 522 (*)    All other components within normal limits  URINALYSIS, ROUTINE W REFLEX MICROSCOPIC - Abnormal; Notable for the following components:   Ketones, ur 5 (*)    All other components within normal limits  BASIC METABOLIC PANEL     Imaging Studies ordered: I ordered imaging studies including foot x-ray, ankle x-ray I independently visualized and interpreted imaging. I agree with the radiologist interpretation   Medicines ordered and prescription drug management: No orders of the defined types were placed in this  encounter.   -I have reviewed the patients home medicines and have made adjustments as needed  Critical interventions none    Cardiac Monitoring: The patient was maintained on a cardiac monitor.  I personally viewed and interpreted the cardiac monitored which showed an underlying rhythm of: NSR  Social Determinants of Health:  Factors impacting patients care include: none   Reevaluation: After the interventions noted above, I reevaluated the patient and found that they have :improved  Co morbidities that complicate the patient evaluation  Past Medical History:  Diagnosis Date   Allergy    Esophageal varices ([place])    Focal nodular hyperplasia of liver    GERD (gastroesophageal reflux disease)    Hypertension    Intestinal metaplasia of gastric mucosa    Lateral epicondylitis of left elbow 07/08/2021      Dispostion: I considered admission for this patient, but he does not meet inpatient criteria for admission and is safe for discharge with outpatient follow-up     Final Clinical Impression(s) / ED Diagnoses Final diagnoses:  None     '@PCDICTATION'$ @    Teressa Lower, MD 01/01/22 [phone]

## 2022-01-14 ENCOUNTER — Ambulatory Visit (HOSPITAL_BASED_OUTPATIENT_CLINIC_OR_DEPARTMENT_OTHER)
Admission: RE | Admit: 2022-01-14 | Discharge: 2022-01-14 | Disposition: A | Discharge status: Home / Self Care | Payer: Self-pay | Source: Ambulatory Visit | Attending: Family Medicine | Admitting: Family Medicine

## 2022-01-14 ENCOUNTER — Inpatient Hospital Stay: Payer: Self-pay

## 2022-01-14 ENCOUNTER — Encounter: Payer: Self-pay | Admitting: Family Medicine

## 2022-01-14 ENCOUNTER — Other Ambulatory Visit: Payer: Self-pay

## 2022-01-14 ENCOUNTER — Inpatient Hospital Stay: Payer: Self-pay | Attending: Hematology and Oncology | Admitting: Hematology and Oncology

## 2022-01-14 ENCOUNTER — Ambulatory Visit (INDEPENDENT_AMBULATORY_CARE_PROVIDER_SITE_OTHER): Payer: Self-pay | Admitting: Family Medicine

## 2022-01-14 VITALS — BP 127/81 | HR 70 | Temp 98.0°F | Resp 15 | Wt 262.5 lb

## 2022-01-14 VITALS — BP 150/80 | Ht 70.0 in | Wt 254.0 lb

## 2022-01-14 DIAGNOSIS — Z79899 Other long term (current) drug therapy: Secondary | ICD-10-CM | POA: Insufficient documentation

## 2022-01-14 DIAGNOSIS — Z87891 Personal history of nicotine dependence: Secondary | ICD-10-CM | POA: Insufficient documentation

## 2022-01-14 DIAGNOSIS — D75839 Thrombocytosis, unspecified: Secondary | ICD-10-CM | POA: Insufficient documentation

## 2022-01-14 DIAGNOSIS — S92241A Displaced fracture of medial cuneiform of right foot, initial encounter for closed fracture: Secondary | ICD-10-CM

## 2022-01-14 DIAGNOSIS — S92251A Displaced fracture of navicular [scaphoid] of right foot, initial encounter for closed fracture: Secondary | ICD-10-CM

## 2022-01-14 DIAGNOSIS — Z886 Allergy status to analgesic agent status: Secondary | ICD-10-CM | POA: Insufficient documentation

## 2022-01-14 DIAGNOSIS — F1290 Cannabis use, unspecified, uncomplicated: Secondary | ICD-10-CM | POA: Insufficient documentation

## 2022-01-14 DIAGNOSIS — K219 Gastro-esophageal reflux disease without esophagitis: Secondary | ICD-10-CM | POA: Insufficient documentation

## 2022-01-14 DIAGNOSIS — Z803 Family history of malignant neoplasm of breast: Secondary | ICD-10-CM | POA: Insufficient documentation

## 2022-01-14 DIAGNOSIS — Z8249 Family history of ischemic heart disease and other diseases of the circulatory system: Secondary | ICD-10-CM | POA: Insufficient documentation

## 2022-01-14 NOTE — Progress Notes (Signed)
[name] [phone]   Fax:([phone]  PROGRESS NOTE  Patient Care Team: [name] as PCP - General (Internal Medicine)  Hematological/Oncological History # Thrombocytosis # Relative Lymphocytosis 10/18/2009: WBC 8.9, Hgb 15.5, MCV 93.5, Plt 578 (first CBC on record) 02/22/2015: WBC 5.9, Hgb 14.0, MCV 89.7, Plt 478 10/07/2018: WBC 7.2, Hgb 14.8, MCV 90.7, Plt 755 09/26/2020: WBC 7.7, Hgb 14.8, MCV 92, Plt 545 10/10/2020: establish care with Dr. Lorenso Courier. Plt 525 01/13/2021: Plt 539   Interval History:  William Fuller 51 y.o. male with medical history significant for thrombocytosis who presents for a follow up visit. The patient's last visit was on 01/13/2022. In the interim since the last visit [name] has had no changes in his health.   On exam today William Fuller reports he unfortunately has fractured his foot and is currently in a leg boot for the next 6 to 8 weeks.  He notes his weight is increasing is up to 262 pounds from 235 pounds and this is due to his decrease in walking.  He notes that he is trying to keep away from pasta and tomato sauce due to his acid reflux.  He notes he is not having any issues with bleeding or bruising.  His energy levels have been "okay".  Other than trying to get his acid under control he has had no other new symptoms.  He denies any chest pain or shortness of breath.  He currently denies any fevers, chills, sweats, nausea, vomiting or diarrhea.  Full 10 point ROS is listed below.  MEDICAL HISTORY:  Past Medical History:  Diagnosis Date   Allergy    Esophageal varices (HCC)    Focal nodular hyperplasia of liver    GERD (gastroesophageal reflux disease)    Hypertension    Intestinal metaplasia of gastric mucosa    Lateral epicondylitis of left elbow 07/08/2021    SURGICAL HISTORY: Past Surgical History:  Procedure Laterality Date   HERNIA REPAIR      SOCIAL HISTORY: Social History   Socioeconomic  History   Marital status: Single    Spouse name: Not on file   Number of children: Not on file   Years of education: Not on file   Highest education level: Not on file  Occupational History   Not on file  Tobacco Use   Smoking status: Former    Types: Cigarettes    Quit date: 06/12/2015    Years since quitting: 6.6   Smokeless tobacco: Never  Vaping Use   Vaping Use: Never used  Substance and Sexual Activity   Alcohol use: No    Alcohol/week: 0.0 standard drinks of alcohol   Drug use: Yes    Types: Marijuana    Comment: pt smokes twice a week. Each time 2 joints.   Sexual activity: Yes  Other Topics Concern   Not on file  Social History Narrative   Not on file   Social Determinants of Health   Financial Resource Strain: Not on file  Food Insecurity: Not on file  Transportation Needs: Not on file  Physical Activity: Not on file  Stress: Not on file  Social Connections: Not on file  Intimate Partner Violence: Not on file    FAMILY HISTORY: Family History  Problem Relation Age of Onset   Hypertension Other    CAD Other    Breast cancer Mother    Heart disease Mother    Heart disease Brother    Heart disease  Maternal Grandmother    Hypertension Maternal Grandmother    Healthy Father    Colon cancer Neg Hx     ALLERGIES:  is allergic to naprosyn [naproxen].  MEDICATIONS:  Current Outpatient Medications  Medication Sig Dispense Refill   cyclobenzaprine (FLEXERIL) 10 MG tablet Take 1 tablet (10 mg total) by mouth at bedtime. (Patient not taking: Reported on 07/08/2021) 7 tablet 0   Diclofenac Sodium (PENNSAID) 2 % SOLN Place 1 application onto the skin 2 (two) times daily. 112 g 2   HYDROcodone-acetaminophen (NORCO/VICODIN) 5-325 MG tablet Take 1 tablet by mouth every 8 (eight) hours as needed. 15 tablet 0   losartan (COZAAR) 25 MG tablet Take 1 tablet (25 mg total) by mouth daily. 30 tablet 3   naproxen (NAPROSYN) 375 MG tablet Take 1 tablet (375 mg total) by  mouth 2 (two) times daily. (Patient not taking: Reported on 01/14/2022) 20 tablet 0   pantoprazole (PROTONIX) 40 MG tablet Take 1 tablet (40 mg total) by mouth 2 (two) times daily. 180 tablet 3   predniSONE (DELTASONE) 5 MG tablet Take 6 pills for first day, 5 pills second day, 4 pills third day, 3 pills fourth day, 2 pills the fifth day, and 1 pill sixth day. 21 tablet 0   SUMAtriptan (IMITREX) 50 MG tablet Take 1 tablet (50 mg total) by mouth every 2 (two) hours as needed for migraine. May repeat in 2 hours if headache persists or recurs.(max 2 tab in 24 hour period) 10 tablet 0   No current facility-administered medications for this visit.    REVIEW OF SYSTEMS:   Constitutional: ( - ) fevers, ( - )  chills , ( - ) night sweats Eyes: ( - ) blurriness of vision, ( - ) double vision, ( - ) watery eyes Ears, nose, mouth, throat, and face: ( - ) mucositis, ( - ) sore throat Respiratory: ( - ) cough, ( - ) dyspnea, ( - ) wheezes Cardiovascular: ( - ) palpitation, ( - ) chest discomfort, ( - ) lower extremity swelling Gastrointestinal:  ( - ) nausea, ( - ) heartburn, ( - ) change in bowel habits Skin: ( - ) abnormal skin rashes Lymphatics: ( - ) new lymphadenopathy, ( - ) easy bruising Neurological: ( - ) numbness, ( - ) tingling, ( - ) new weaknesses Behavioral/Psych: ( - ) mood change, ( - ) new changes  All other systems were reviewed with the patient and are negative.  PHYSICAL EXAMINATION: Vitals:   01/14/22 1518  BP: 127/81  Pulse: 70  Resp: 15  Temp: 98 F (36.7 C)  SpO2: 95%   Filed Weights   01/14/22 1518  Weight: 262 lb 8 oz (119.1 kg)    GENERAL: Well-appearing middle-aged African-American male ,alert, no distress and comfortable SKIN: skin color, texture, turgor are normal, no rashes or significant lesions EYES: conjunctiva are pink and non-injected, sclera clear LUNGS: clear to auscultation and percussion with normal breathing effort HEART: regular rate & rhythm and no  murmurs and no lower extremity edema Musculoskeletal: no cyanosis of digits and no clubbing  PSYCH: alert & oriented x 3, fluent speech NEURO: no focal motor/sensory deficits  LABORATORY DATA:  I have reviewed the data as listed    Latest Ref Rng & Units 12/31/2021    9:02 PM 01/13/2021    2:54 PM 10/10/2020    9:50 AM  CBC  WBC 4.0 - 10.5 K/uL 8.1  6.5  7.4   Hemoglobin  13.0 - 17.0 g/dL 14.9  15.4  14.7   Hematocrit 39.0 - 52.0 % 45.7  45.9  43.2   Platelets 150 - 400 K/uL 522  539  525        Latest Ref Rng & Units 12/31/2021    9:02 PM 01/13/2021    2:54 PM 10/10/2020    9:50 AM  CMP  Glucose 70 - 99 mg/dL 94  108  100   BUN 6 - 20 mg/dL _0 Creatinine 0.61 - 1.24 mg/dL 1.18  1.21  1.11   Sodium 135 - 145 mmol/L 139  141  137   Potassium 3.5 - 5.1 mmol/L 3.9  4.1  4.0   Chloride 98 - 111 mmol/L 106  106  106   CO2 22 - 32 mmol/L _1 Calcium 8.9 - 10.3 mg/dL 9.2  9.3  9.1   Total Protein 6.5 - 8.1 g/dL  7.7  7.5   Total Bilirubin 0.3 - 1.2 mg/dL  0.5  0.2   Alkaline Phos 38 - 126 U/L  42  42   AST 15 - 41 U/L  21  35   ALT 0 - 44 U/L  24  37     RADIOGRAPHIC STUDIES: DG Foot Complete Right  Result Date: 01/15/2022 CLINICAL DATA:  [address] injuring right foot 12/31/2021. Closed displaced fracture of medial cuneiform of right foot. Initial encounter. Closed avulsion fracture of the navicular bone of right foot. Initial encounter. EXAM: RIGHT FOOT COMPLETE - 3+ VIEW COMPARISON:  Right ankle and foot radiographs 12/31/2021 FINDINGS: There again small avulsion bone fragments at the posterior dorsal aspect of the navicular and posterior dorsal aspect and of an adjacent cuneiform seen on lateral view. It is again difficult to visualize which cuneiform is affected on the other views. Minimal early healing sclerosis at these avulsion fractures. No dislocation.  Joint spaces are preserved. IMPRESSION: Minimal early healing sclerosis at the dorsal navicular and dorsal  cuneiform avulsion fractures seen previously. Electronically Signed   By: Yvonne Kendall M.D.   On: 01/15/2022 07:58   DG Foot Complete Right  Result Date: 12/31/2021 CLINICAL DATA:  Fall, pain EXAM: RIGHT FOOT COMPLETE - 3+ VIEW COMPARISON:  None Available. FINDINGS: There are avulsion fractures off the anterior aspect of the navicular and adjacent medial cuneiform. No additional fracture. No subluxation or dislocation. Soft tissues are intact. IMPRESSION: Avulsion fragments off the anterior aspect of the navicular and medial cuneiform Electronically Signed   By: Rolm Baptise M.D.   On: 12/31/2021 21:33   DG Ankle Complete Right  Result Date: 12/31/2021 CLINICAL DATA:  Fall, pain EXAM: RIGHT ANKLE - COMPLETE 3+ VIEW COMPARISON:  None Available. FINDINGS: Bone fragments are noted a off the anterior aspect of the navicular and adjacent cuneiform on the lateral view. No tibial or fibular fracture. Ankle joint spaces maintained. Soft tissues are intact. IMPRESSION: Walls fragments off the anterior surface of the navicular and adjacent cuneiform. Electronically Signed   By: Rolm Baptise M.D.   On: 12/31/2021 21:32    ASSESSMENT & PLAN Laydon Martis 51 y.o. male with medical history significant for thrombocytosis who presents for a follow up visit.   At this time there is no clear explanation for the patient's thrombocytosis.  He has no evidence of an MPN, no evidence of inflammation, and no evidence of iron deficiency.  These of the 3 primary causes of thrombocytosis.  Additionally the  patient is never had a splenectomy.  I would recommend because we are unclear on the cause that he continue his aspirin 81 mg p.o. daily.  The patient voices understanding of this plan.  We will plan to see him back in 6 months time for labs and 12 months time for repeat clinic visit.  # Thrombocytosis, stable.  # Relative Lymphocytosis, resolved --findings have been present since at least 2011, however JAK2 panel w/  reflex and BCR/ABL FISH were negative.  -- iron panel, ferritin, and ESR were unremarkable --no sign of a driver mutation the patient, it does not appear that a bone marrow biopsy is necessary.  --recommend continuing ASA 14m PO at this time.  --Labs today show white blood cell count 8.1, hemoglobin 14.9, MCV 91.8, and platelets of 522. --RTC in 12 months clinic visit for continued monitoring  No orders of the defined types were placed in this encounter.   All questions were answered. The patient knows to call the clinic with any problems, questions or concerns.  A total of more than 25 minutes were spent on this encounter with face-to-face time and non-face-to-face time, including preparing to see the patient, ordering tests and/or medications, counseling the patient and coordination of care as outlined above.   JLedell Peoples MD Department of Hematology/Oncology [facility] LLCPhone: [phone]: [phone]: [email or web address]  01/20/2022 5:22 PM

## 2022-01-14 NOTE — Patient Instructions (Signed)
Good to see you Please use ice as needed. Please use the boot. You can consider taking vitamin K2 and D3 to help with bone healing. Please send me a message in MyChart with any questions or updates.  Please see me back in 3 to 4 weeks.   --Dr. Raeford Razor

## 2022-01-14 NOTE — Assessment & Plan Note (Signed)
Acutely occurring.  Initial injury on 5/31.  Appears to have a dorsal avulsion fracture. -Counseled on home exercise therapy and supportive care. -Cam walker. -X-ray

## 2022-01-14 NOTE — Assessment & Plan Note (Signed)
Initial injury on 5/31.  Having pain and swelling in the midfoot as well as forefoot. -Counseled on home exercise therapy and supportive care. -Placed in cam walker. -X-ray.

## 2022-01-14 NOTE — Progress Notes (Signed)
  William Fuller - 51 y.o. male MRN [id number]  Date of birth: 1971-04-18  SUBJECTIVE:  Including CC & ROS.  No chief complaint on file.   William Fuller is a 51 y.o. male that is presenting with acute right foot pain.  He had an episode of syncope a few days ago.  He was found to have a fracture of his midfoot.  He is having pain and swelling in this area.  He continues to have pain through this area.  Review of the emergency department note from 5/31 shows he was found to have avulsion fracture provided ASO brace and crutches. Independent review of the right foot x-ray shows an avulsion fracture off the Navicular and medial cuneiform. Independent review of the right ankle x-ray from 5/21 shows changes of the navicular cuneiform.  Review of Systems See HPI   HISTORY: Past Medical, Surgical, Social, and Family History Reviewed & Updated per EMR.   Pertinent Historical Findings include:  Past Medical History:  Diagnosis Date   Allergy    Esophageal varices (HCC)    Focal nodular hyperplasia of liver    GERD (gastroesophageal reflux disease)    Hypertension    Intestinal metaplasia of gastric mucosa    Lateral epicondylitis of left elbow 07/08/2021    Past Surgical History:  Procedure Laterality Date   HERNIA REPAIR       PHYSICAL EXAM:  VS: BP (!) 150/80 (BP Location: Left Arm, Patient Position: Sitting)   Ht '5\' 10"'$  (1.778 m)   Wt 254 lb (115.2 kg)   BMI 36.45 kg/m  Physical Exam Gen: NAD, alert, cooperative with exam, well-appearing MSK:  Neurovascularly intact       ASSESSMENT & PLAN:   Closed displaced fracture of medial cuneiform of right foot Initial injury on 5/31.  Having pain and swelling in the midfoot as well as forefoot. -Counseled on home exercise therapy and supportive care. -Placed in cam walker. -X-ray.   Closed avulsion fracture of navicular bone of right foot Acutely occurring.  Initial injury on 5/31.  Appears to have a dorsal avulsion  fracture. -Counseled on home exercise therapy and supportive care. -Cam walker. -X-ray

## 2022-01-15 ENCOUNTER — Telehealth: Payer: Self-pay | Admitting: Family Medicine

## 2022-01-15 NOTE — Telephone Encounter (Signed)
Left VM for patient. If he calls back please have him speak with a nurse/CMA and inform that his x-rays are showing some healing.  We will continue with CAM Walker and follow-up as scheduled.   If any questions then please take the best time and phone number to call and I will try to call him back.   Rosemarie Ax, MD [facility] 01/15/2022, 10:50 AM

## 2022-01-16 ENCOUNTER — Ambulatory Visit (INDEPENDENT_AMBULATORY_CARE_PROVIDER_SITE_OTHER): Payer: Self-pay | Admitting: Medical

## 2022-01-16 ENCOUNTER — Encounter: Payer: Self-pay | Admitting: Medical

## 2022-01-16 VITALS — BP 130/80 | HR 86 | Resp 18 | Ht 70.0 in | Wt 264.0 lb

## 2022-01-16 DIAGNOSIS — K219 Gastro-esophageal reflux disease without esophagitis: Secondary | ICD-10-CM

## 2022-01-16 DIAGNOSIS — S92251A Displaced fracture of navicular [scaphoid] of right foot, initial encounter for closed fracture: Secondary | ICD-10-CM

## 2022-01-16 DIAGNOSIS — I10 Essential (primary) hypertension: Secondary | ICD-10-CM

## 2022-01-16 DIAGNOSIS — S93401A Sprain of unspecified ligament of right ankle, initial encounter: Secondary | ICD-10-CM

## 2022-01-16 DIAGNOSIS — R1310 Dysphagia, unspecified: Secondary | ICD-10-CM

## 2022-01-16 DIAGNOSIS — S92241A Displaced fracture of medial cuneiform of right foot, initial encounter for closed fracture: Secondary | ICD-10-CM

## 2022-01-16 MED ORDER — PANTOPRAZOLE SODIUM 40 MG PO TBEC: 40.0000 mg | DELAYED_RELEASE_TABLET | Freq: Two times a day (BID) | ORAL | 3 refills | Status: DC

## 2022-01-16 NOTE — Patient Instructions (Addendum)
For rt ankle sprain and foot fracture continue to use boot and follow up with Sports med MD.  William Fuller- bp better on recheck. Continue losartan 25 mg daily.  For persisting gerd refilling your protonix and refer to GI MD(also has been on med twice daily). Also refer for 2 vasovagal episodes following difficulty swallowing.  Follow up late summer/early fall for wellness exam. Sooner if needed.

## 2022-01-16 NOTE — Progress Notes (Signed)
Subjective:    Patient ID: William Fuller, male    DOB: 1970/08/26, 51 y.o.   MRN: [id number]  HPI  Pt in for follow up from in the ED. Below is ED visit note in ".  Arrival date & time: 12/31/21 2009   Chief Complaint(s) Foot Pain and Loss of Consciousness   HPI William Fuller is a 51 y.o. male chronic thrombocytosis, HTN, GERD who presents emergency department for evaluation of a syncopal event and right ankle pain.  Patient states that he was drinking coffee this morning when a small amount of coffee went into his trachea and he had a coughing fit that progressed into posttussive emesis.  He then woke up on the ground.  His partner then found him on the ground and states that he returned to normal mental status baseline within 2 to 3 minutes of this happening.  Patient states that he has significant right ankle pain since the fall but denies headache, numbness, tingling, weakness or other neurologic or traumatic complaints.  DG Foot Complete Right   Result Date: 12/31/2021 CLINICAL DATA:  Fall, pain EXAM: RIGHT FOOT COMPLETE - 3+ VIEW COMPARISON:  None Available. FINDINGS: There are avulsion fractures off the anterior aspect of the navicular and adjacent medial cuneiform. No additional fracture. No subluxation or dislocation. Soft tissues are intact. IMPRESSION: Avulsion fragments off the anterior aspect of the navicular and medial cuneiform    Review of Systems  Constitutional:  Negative for chills, fatigue and fever.  Respiratory:  Negative for cough, chest tightness, shortness of breath and wheezing.   Cardiovascular:  Negative for chest pain and palpitations.  Gastrointestinal:  Negative for abdominal pain.  Musculoskeletal:  Negative for back pain.       Rt ankle avulsion fracture and sprain.  Skin:  Negative for rash.  Neurological:  Negative for dizziness, seizures, syncope, weakness and light-headedness.  Psychiatric/Behavioral:  Negative for behavioral problems and  dysphoric mood. The patient is not nervous/anxious.     This patient presents to the ED for concern of syncope, ankle pain, this involves an extensive number of treatment options, and is a complaint that carries with it a high risk of complications and morbidity.  The differential diagnosis includes vasovagal syncope, orthostatic syncope, fracture, ankle sprain   MDM: Patient seen emergency room for evaluation of syncope and ankle pain.  Physical exam reveals swelling and tenderness to the right ankle but pulses intact with no focal motor or sensory deficits.  Laboratory evaluation unremarkable outside of an elevated platelet count which is chronic for this patient x-ray with avulsion fractures to the navicular and cuneiform bones on the right consistent with a fairly severe ankle sprain.  Patient presentation appears consistent with vasovagal syncope in the setting of a coughing fit with posttussive emesis.  Very low suspicion for cardiogenic syncope at this time.  Ace wrap placed, patient placed in an Aircast and given crutches.  Patient already follows with a sports medicine physician and thus he will call to set up follow-up.  Patient then discharged with Naprosyn."  Pt has seen sport med MD.  William Fuller- pt bp 2 days ago well controlled. Pt on losartan 25 mg daily.    Pt tells me 2 month ago also was drinking some fruit juice. He states he feel like it went down wrong. He was coughing a lot and then passed out out for seconds.   Pt has hx of gerd. He states most of time if not on pantoprazole his  reflux will more.     Objective:   Physical Exam  General Mental Status- Alert. General Appearance- Not in acute distress.   Skin General: Color- Normal Color. Moisture- Normal Moisture.  Neck Carotid Arteries- Normal color. Moisture- Normal Moisture. No carotid bruits. No JVD.  Chest and Lung Exam Auscultation: Breath Sounds:-Normal.  Cardiovascular Auscultation:Rythm- Regular. Murmurs &  Other Heart Sounds:Auscultation of the heart reveals- No Murmurs.    Neurologic Cranial Nerve exam:- CN III-XII intact(No nystagmus), symmetric smile. Strength:- 5/5 equal and symmetric strength both upper and lower extremities.   Rt ankle- in boot.     Assessment & Plan:   Patient Instructions  For rt ankle sprain and foot fracture continue to use boot and follow up with Sports med MD.  William Fuller- bp better on recheck. Continue losartan 25 mg daily.  For persisting gerd refilling your protonix and refer to GI MD(also has been on med twice daily). Also refer for 2 vasovagal episodes following difficulty swallowing.  Follow up late summer/early fall for wellness exam. Sooner if needed.    William Pai, PA-C

## 2022-02-10 ENCOUNTER — Encounter: Payer: Self-pay | Admitting: Physician Assistant

## 2022-02-13 ENCOUNTER — Encounter: Payer: Self-pay | Admitting: Family Medicine

## 2022-02-13 ENCOUNTER — Ambulatory Visit (HOSPITAL_BASED_OUTPATIENT_CLINIC_OR_DEPARTMENT_OTHER)
Admission: RE | Admit: 2022-02-13 | Discharge: 2022-02-13 | Disposition: A | Discharge status: Home / Self Care | Payer: Self-pay | Source: Ambulatory Visit | Attending: Family Medicine | Admitting: Family Medicine

## 2022-02-13 ENCOUNTER — Ambulatory Visit (INDEPENDENT_AMBULATORY_CARE_PROVIDER_SITE_OTHER): Payer: Self-pay | Admitting: Family Medicine

## 2022-02-13 VITALS — BP 128/82 | Ht 70.0 in | Wt 264.0 lb

## 2022-02-13 DIAGNOSIS — S92241D Displaced fracture of medial cuneiform of right foot, subsequent encounter for fracture with routine healing: Secondary | ICD-10-CM | POA: Insufficient documentation

## 2022-02-13 DIAGNOSIS — S92251D Displaced fracture of navicular [scaphoid] of right foot, subsequent encounter for fracture with routine healing: Secondary | ICD-10-CM | POA: Insufficient documentation

## 2022-02-13 NOTE — Progress Notes (Signed)
  William Fuller - 51 y.o. male MRN [id number]  Date of birth: October 22, 1970  SUBJECTIVE:  Including CC & ROS.  No chief complaint on file.   William Fuller is a 51 y.o. male that is following up for his right foot fracture.  Denies any pain and has been walking without the cam walker.  Swelling has improved.  Notices some altered sensation over the dorsum of the foot.   Review of Systems See HPI   HISTORY: Past Medical, Surgical, Social, and Family History Reviewed & Updated per EMR.   Pertinent Historical Findings include:  Past Medical History:  Diagnosis Date   Allergy    Esophageal varices (HCC)    Focal nodular hyperplasia of liver    GERD (gastroesophageal reflux disease)    Hypertension    Intestinal metaplasia of gastric mucosa    Lateral epicondylitis of left elbow 07/08/2021    Past Surgical History:  Procedure Laterality Date   HERNIA REPAIR       PHYSICAL EXAM:  VS: BP 128/82 (BP Location: Left Arm, Patient Position: Sitting)   Ht '5\' 10"'$  (1.778 m)   Wt 264 lb (119.7 kg)   BMI 37.88 kg/m  Physical Exam Gen: NAD, alert, cooperative with exam, well-appearing MSK:  Neurovascularly intact       ASSESSMENT & PLAN:   Closed avulsion fracture of navicular bone of right foot Initial injury on 5/31.  No pain today and no swelling. -Counseled on home exercise therapy and supportive care. -X-ray. -Counseled on weaning out of the cam walker. -Green sport insoles.  Closed displaced fracture of medial cuneiform of right foot Initial injury on 5/31.  No pain today and no swelling. -Counseled on home exercise therapy and supportive care. -X-ray. -Counseled on weaning out of the cam walker. -Green sport insoles.

## 2022-02-13 NOTE — Assessment & Plan Note (Signed)
Initial injury on 5/31.  No pain today and no swelling. -Counseled on home exercise therapy and supportive care. -X-ray. -Counseled on weaning out of the cam walker. -Green sport insoles.

## 2022-02-13 NOTE — Patient Instructions (Signed)
Good to see you Please use ice as needed  Please try the insoles  I will call with the xray results.   Please send me a message in MyChart with any questions or updates.  Please see me back as needed.   --Dr. Raeford Razor

## 2022-02-16 ENCOUNTER — Ambulatory Visit: Payer: Self-pay | Admitting: Medical

## 2022-02-16 ENCOUNTER — Telehealth: Payer: Self-pay | Admitting: Family Medicine

## 2022-02-16 NOTE — Telephone Encounter (Signed)
Left VM for patient. If he calls back please have him speak with a nurse/CMA and inform that his fractures have not healed completely yet.  It can take a little while longer.  He can continue without the cam walker if there is no pain or swelling..   If any questions then please take the best time and phone number to call and I will try to call him back.   Rosemarie Ax, MD [facility] 02/16/2022, 8:56 AM

## 2022-03-09 NOTE — Progress Notes (Signed)
03/10/2022 William Fuller [id number] 1971-04-02  Referring provider: [name], PA-C Primary GI doctor: Dr. Luvenia Starch  ASSESSMENT AND PLAN:   Assessment: 51 y.o. male here for assessment of the following: 1. Dysphagia, unspecified type   2. Gastroesophageal reflux disease with esophagitis without hemorrhage   3. History of colonic polyps    Dysphagia worse with liquids, had syncopal episode with drinking liquids.  History of GERD, poorly controlled on pantoprazole 40 mg BID.  Last EGD showed 01/01/2017 - 3cm hiatal hernia, LA grade A esophagitis, ? Trace varices, gastritis - focal GIM on path, HP negative. No ETOH, no NSAIDS.  No melena, no weight loss, no dysphagia to solids.   Colonoscopy 01/01/2017 - 22m sigmoid polyp, internal hemorrhoids, cecal AVM, hemorrhoids - benign hyperplastic changes on bx  Plan: Patient needs repeat EGD to evaluate for previous EGD with focal GIM/gastritis/esophagitis/varies and current symptoms.  Discussed with patient, he has insurance appears with [facility] and would like to try to pursue EGD there due [place] Will get barium swallow in the mean time due to issue with liquids to evaluate for zenker's, motility, achalasia, strictures, etc.  ER precautions discussed with the patient.  Suggest getting evaluated for OSA with exam and history.  Complaining of refractory GERD symptoms despite PPI therapy Continue with antireflux measures and lifestyle modifications, continue on PPI emphasizing timing prior to meals. Add on carafate 1 gram up to 4 x a day for 10 days.   Colonoscopy recall 01/2027   Orders Placed This Encounter  Procedures   DG ESOPHAGUS W DOUBLE CM (HD)    Meds ordered this encounter  Medications   sucralfate (CARAFATE) 1 g tablet    Sig: Take 1 tablet (1 g total) by mouth 4 (four) times daily -  with meals and at bedtime for 10 days.    Dispense:  40 tablet    Refill:  0      Patient Care  Team: Saguier, EIris Pertas PCP - General (Internal Medicine)  HISTORY OF PRESENT ILLNESS: 51y.o. male with a past medical history of thrombocytosis, GERD with hiatal hernia, obesity, [place] MRCP 2019 and others listed below presents for evaluation of swallowing issues.   He has been having trouble swallowing, getting progressively worse. Has been worse x 2 months.  Twice he has choked on liquid and had syncopal episode, broke his foot with on of the episodes, happened May 31st. No issues with solids.  States about 2 hours after eating will have burping/belching with food coming up, no nausea with this.  He states his reflux has been severely worse, has been on pantoprazole twice a day before food.  He denies melena, AB pain.Can have chest pain and SOB with bloating, not with exertion.    He reports nausea, worse in the AM and right before eating/thought of getting ready to eat, with only one episode of vomiting.  He  reports AB bloating.  No unintentional weight loss, no night sweats. He reports nocturnal symptoms, will wake up with burning sensation in chest, can choke himself awake. He does snore, has not been evaluated for OSA.  Will eat around 830 or 9 pm, going to bed around 11pm.   He denies NSAID use, took some naprosyn in June for his foot but very shortly due to worsening AB pain with it.  He denies ETOH use.   He denies family history of gallbladder issues.  20 year smoking history  1/2 pack a day, quit about 6 years ago.  Denies weight loss, has had weight gain.  Patient has insurance with [facility].   Prior workup EGD 01/01/2017 - 3cm hiatal hernia, LA grade A esophagitis, ? 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 (Patient taking differently: Take 25 mg by mouth daily. As needed)   Current Outpatient Medications (Analgesics):    SUMAtriptan (IMITREX) 50 MG tablet, Take 1 tablet (50 mg total) by mouth every 2 (two) hours as needed for migraine. May repeat in 2 hours if headache persists or recurs.(max 2 tab in 24 hour period)   Current Outpatient Medications (Other):    pantoprazole (PROTONIX) 40 MG tablet, Take 1 tablet (40 mg total) by mouth 2 (two) times daily.  Medical History:  Past Medical History:  Diagnosis Date   Allergy    Esophageal varices (HCC)    Focal nodular hyperplasia of liver    GERD (gastroesophageal reflux disease)    Hypertension    Intestinal metaplasia of gastric mucosa    Lateral epicondylitis of left elbow 07/08/2021   Allergies:  Allergies  Allergen Reactions   Naprosyn [Naproxen] Itching and Rash     Surgical History:  He  has a past surgical history that includes Hernia repair; Colonoscopy; and Esophagogastroduodenoscopy. Family History:  His family history includes Blindness in his father; Breast cancer in his mother; CAD in an other family member; Cancer in his maternal grandfather; Heart disease in his brother, maternal grandmother, and mother; Hypertension in his maternal grandmother and another family member. Social History:   reports that he quit smoking about 6 years ago. His smoking use included cigarettes. He has never used smokeless tobacco. He reports current drug use. Drug: Marijuana. He reports that he does not drink alcohol.  REVIEW OF  SYSTEMS  : All other systems reviewed and negative except where noted in the History of Present Illness.   PHYSICAL EXAM: BP (!) 140/90   Pulse 66   Ht '5\' 11"'$  (1.803 m)   Wt 268 lb (121.6 kg)   BMI 37.38 kg/m  General:   Pleasant, obese male in no acute distress Head:   Normocephalic and atraumatic. Small oropharynx. No lymphadenopathy  Eyes:  sclerae anicteric,conjunctive pink  Heart:   regular rate and rhythm Pulm:  Clear anteriorly; no wheezing Abdomen:   Soft, Obese AB, Active bowel sounds. No tenderness . Without guarding and Without rebound, No organomegaly appreciated. Rectal: Not evaluated Extremities:  Without edema. Msk: Symmetrical without gross deformities. Peripheral pulses intact.  Neurologic:  Alert and  oriented x4;  No focal deficits.  Skin:   Dry and intact without significant lesions or rashes. Psychiatric:  Cooperative. Normal mood and affect.    AVladimir Crofts PA-C 10:56 AM

## 2022-03-10 ENCOUNTER — Ambulatory Visit (INDEPENDENT_AMBULATORY_CARE_PROVIDER_SITE_OTHER): Payer: Self-pay | Admitting: Physician Assistant

## 2022-03-10 ENCOUNTER — Encounter: Payer: Self-pay | Admitting: Physician Assistant

## 2022-03-10 VITALS — BP 140/90 | HR 66 | Ht 71.0 in | Wt 268.0 lb

## 2022-03-10 DIAGNOSIS — K2100 Gastro-esophageal reflux disease with esophagitis, without bleeding: Secondary | ICD-10-CM

## 2022-03-10 DIAGNOSIS — R1310 Dysphagia, unspecified: Secondary | ICD-10-CM

## 2022-03-10 DIAGNOSIS — Z86010 Personal history of colonic polyps: Secondary | ICD-10-CM

## 2022-03-10 MED ORDER — SUCRALFATE 1 G PO TABS: 1.0000 g | ORAL_TABLET | Freq: Three times a day (TID) | ORAL | 0 refills | Status: DC

## 2022-03-10 NOTE — Patient Instructions (Addendum)
Check with PCP about sleep apnea/getting evaluated for this.  Any chest pain, shortness of breath go to ER.   If you are unable to get in with [facility], please contact us so we can set you up for an EGD which I think you need.  Will set up for barium swallow in the mean time with symptoms worse with liquids.   Behavioral and Dietary Strategies for Management of Esophageal Dysmotility/dysphagia 1. Take reflux medications 30+ minutes before food in the morning.  2. Begin meals with warm beverage 3. Eat smaller more frequent meals 4. Eat slowly, taking small bites and sips 5. Alternate solids and liquids 6. Avoid foods/liquids that increase acid production 7. Sit upright during and for 30+ minutes after meals to facilitate esophageal clearing 8. Can try altoid melting in mouth before food  Please take your proton pump inhibitor medication, continue the protonix twice a day. Will add on carafate pills up to 4 x a day for 4 weeks.   Please take this medication 30 minutes to 1 hour before meals- this makes it more effective.  Avoid spicy and acidic foods Avoid fatty foods Limit your intake of coffee, tea, alcohol, and carbonated drinks Work to maintain a healthy weight Keep the head of the bed elevated at least 3 inches with blocks or a wedge pillow if you are having any nighttime symptoms Stay upright for 2 hours after eating Avoid meals and snacks three to four hours before bedtime  You will be contacted by [place] in the next 2 days to arrange a Barium Esophagram.  The number on your caller ID will be [phone], please answer when they call.  If you have not heard from them in 2 days please call [phone] to schedule.      You have been scheduled for a Barium Esophogram at [facility] (1st floor of the hospital) on _________ at ________. Please arrive 15 minutes prior to your appointment for registration. Make certain not to have anything to eat or drink  3 hours prior to your test. If you need to reschedule for any reason, please contact radiology at [phone] to do so. __________________________________________________________________ A barium swallow is an examination that concentrates on views of the esophagus. This tends to be a double contrast exam (barium and two liquids which, when combined, create a gas to distend the wall of the oesophagus) or single contrast (non-ionic iodine based). The study is usually tailored to your symptoms so a good history is essential. Attention is paid during the study to the form, structure and configuration of the esophagus, looking for functional disorders (such as aspiration, dysphagia, achalasia, motility and reflux) EXAMINATION You may be asked to change into a gown, depending on the type of swallow being performed. A radiologist and radiographer will perform the procedure. The radiologist will advise you of the type of contrast selected for your procedure and direct you during the exam. You will be asked to stand, sit or lie in several different positions and to hold a small amount of fluid in your mouth before being asked to swallow while the imaging is performed .In some instances you may be asked to swallow barium coated marshmallows to assess the motility of a solid food bolus. The exam can be recorded as a digital or video fluoroscopy procedure. POST PROCEDURE It will take 1-2 days for the barium to pass through your system. To facilitate this, it is important, unless otherwise directed, to increase your fluids  for the next 24-48hrs and to resume your normal diet.  This test typically takes about 30 minutes to perform. __________________________________________________________________________________   Due to recent changes in healthcare laws, you may see the results of your imaging and laboratory studies on MyChart before your provider has had a chance to review them.  We understand that in some cases  there may be results that are confusing or concerning to you. Not all laboratory results come back in the same time frame and the provider may be waiting for multiple results in order to interpret others.  Please give [place] 48 hours in order for your provider to thoroughly review all the results before contacting the office for clarification of your results.    I appreciate the  opportunity to care for you  Thank You   [facility]

## 2022-03-10 NOTE — Progress Notes (Signed)
Agree with assessment and plan as outlined.  

## 2022-03-24 ENCOUNTER — Ambulatory Visit (HOSPITAL_COMMUNITY)
Admission: RE | Admit: 2022-03-24 | Discharge: 2022-03-24 | Disposition: A | Discharge status: Home / Self Care | Payer: No Typology Code available for payment source | Source: Ambulatory Visit | Attending: Physician Assistant | Admitting: Physician Assistant

## 2022-03-24 DIAGNOSIS — R1310 Dysphagia, unspecified: Secondary | ICD-10-CM | POA: Diagnosis present

## 2022-03-24 DIAGNOSIS — K2100 Gastro-esophageal reflux disease with esophagitis, without bleeding: Secondary | ICD-10-CM | POA: Insufficient documentation

## 2022-03-31 ENCOUNTER — Ambulatory Visit: Payer: No Typology Code available for payment source | Admitting: Family Medicine

## 2022-09-23 ENCOUNTER — Ambulatory Visit (HOSPITAL_COMMUNITY)
Admission: EM | Admit: 2022-09-23 | Discharge: 2022-09-23 | Disposition: A | Discharge status: Home / Self Care | Payer: Commercial Managed Care - HMO | Attending: Internal Medicine | Admitting: Internal Medicine

## 2022-09-23 ENCOUNTER — Encounter (HOSPITAL_COMMUNITY): Payer: Self-pay

## 2022-09-23 DIAGNOSIS — J0100 Acute maxillary sinusitis, unspecified: Secondary | ICD-10-CM | POA: Diagnosis present

## 2022-09-23 DIAGNOSIS — Z1152 Encounter for screening for COVID-19: Secondary | ICD-10-CM | POA: Insufficient documentation

## 2022-09-23 DIAGNOSIS — R5381 Other malaise: Secondary | ICD-10-CM | POA: Diagnosis not present

## 2022-09-23 DIAGNOSIS — Z87891 Personal history of nicotine dependence: Secondary | ICD-10-CM | POA: Insufficient documentation

## 2022-09-23 DIAGNOSIS — F1290 Cannabis use, unspecified, uncomplicated: Secondary | ICD-10-CM | POA: Insufficient documentation

## 2022-09-23 LAB — POC INFLUENZA A AND B ANTIGEN (URGENT CARE ONLY)
INFLUENZA A ANTIGEN, POC: NEGATIVE
INFLUENZA B ANTIGEN, POC: NEGATIVE

## 2022-09-23 LAB — SARS CORONAVIRUS 2 (TAT 6-24 HRS): SARS Coronavirus 2: NEGATIVE

## 2022-09-23 MED ORDER — AMOXICILLIN-POT CLAVULANATE 875-125 MG PO TABS: 1.0000 | ORAL_TABLET | Freq: Two times a day (BID) | ORAL | 0 refills | Status: DC

## 2022-09-23 MED ORDER — BENZONATATE 100 MG PO CAPS: 100.0000 mg | ORAL_CAPSULE | Freq: Three times a day (TID) | ORAL | 0 refills | Status: DC

## 2022-09-23 NOTE — ED Provider Notes (Signed)
[facility]   [id number] 09/23/22 Arrival Time: 0802  ASSESSMENT & PLAN:  1. Acute non-recurrent maxillary sinusitis    -History and exam is concerning for both a sinusitis and a viral illness.  His point-of-care flu testing today is negative.  COVID testing is pending.  Will call with results if positive.  In the meantime, I think it will be prudent to treat him for sinusitis with Augmentin since he has failed the 1 week watchful waiting period.  I recommended continued use of over-the-counter medications such as Robitussin, Mucinex for symptomatic relief.  Encouraged lots of hydration and rest.  Work note given.  All questions answered and agrees to plan.   Meds ordered this encounter  Medications   amoxicillin-clavulanate (AUGMENTIN) 875-125 MG tablet    Sig: Take 1 tablet by mouth every 12 (twelve) hours.    Dispense:  14 tablet    Refill:  0   benzonatate (TESSALON) 100 MG capsule    Sig: Take 1 capsule (100 mg total) by mouth every 8 (eight) hours.    Dispense:  21 capsule    Refill:  0     Discharge Instructions      Take Augmentin twice per day for 7 days Take the tessalon medicine up to 3x per day for cough        Reviewed expectations re: course of current medical issues. Questions answered. Outlined signs and symptoms indicating need for more acute intervention. Patient verbalized understanding. After Visit Summary given.   SUBJECTIVE: Pleasant 52 year old male comes to clinic to be evaluated for cough, nasal congestion, chills, headache, facial pain for 6 days.  He says his boss came to work sick last week and now everybody at work is sick.  He reports general malaise.  Mildly productive cough of yellow sputum that is worse at night.  Sore throat.  He reports lots of "pressure" in the forehead and in the maxillary bones bilaterally.  He has tried taking some Mucinex but has not made much better.  He does not think he has had any fevers.  No chest  pain.  No LMP for male patient. Past Surgical History:  Procedure Laterality Date   COLONOSCOPY     ESOPHAGOGASTRODUODENOSCOPY     HERNIA REPAIR     umbilical , as a child     OBJECTIVE:  Vitals:   09/23/22 0829  BP: (!) 146/92  Pulse: 75  Resp: 16  Temp: 99 F (37.2 C)  TempSrc: Oral  SpO2: 95%     Physical Exam Vitals and nursing note reviewed.  Constitutional:      General: He is not in acute distress.    Appearance: Normal appearance.  HENT:     Head: Normocephalic.     Right Ear: Tympanic membrane, ear canal and external ear normal.     Left Ear: Tympanic membrane, ear canal and external ear normal.     Nose: Congestion present.     Mouth/Throat:     Pharynx: Posterior oropharyngeal erythema present. No oropharyngeal exudate.  Cardiovascular:     Rate and Rhythm: Normal rate.     Heart sounds: Normal heart sounds.  Pulmonary:     Effort: Pulmonary effort is normal. No respiratory distress.  Abdominal:     General: Abdomen is flat.     Palpations: Abdomen is soft.  Musculoskeletal:        General: Normal range of motion.     Cervical back: Normal range of motion.  Lymphadenopathy:  Cervical: Cervical adenopathy present.  Neurological:     General: No focal deficit present.  Psychiatric:        Mood and Affect: Mood normal.      Labs: Results for orders placed or performed during the hospital encounter of 09/23/22  POC Influenza A & B Ag (Urgent Care)  Result Value Ref Range   INFLUENZA A ANTIGEN, POC NEGATIVE NEGATIVE   INFLUENZA B ANTIGEN, POC NEGATIVE NEGATIVE   Labs Reviewed  SARS CORONAVIRUS 2 (TAT 6-24 HRS)  POC INFLUENZA A AND B ANTIGEN (URGENT CARE ONLY)    Imaging: No results found.   Allergies  Allergen Reactions   Naproxen Itching and Rash                                               Past Medical History:  Diagnosis Date   Allergy    Esophageal varices (HCC)    Focal nodular hyperplasia of liver    GERD  (gastroesophageal reflux disease)    Hypertension    Intestinal metaplasia of gastric mucosa    Lateral epicondylitis of left elbow 07/08/2021    Social History   Socioeconomic History   Marital status: Single    Spouse name: Not on file   Number of children: 2   Years of education: Not on file   Highest education level: Not on file  Occupational History   Not on file  Tobacco Use   Smoking status: Former    Types: Cigarettes    Quit date: 06/12/2015    Years since quitting: 7.2   Smokeless tobacco: Never  Vaping Use   Vaping Use: Never used  Substance and Sexual Activity   Alcohol use: No    Alcohol/week: 0.0 standard drinks of alcohol   Drug use: Yes    Types: Marijuana    Comment: pt smokes twice a week. Each time 2 joints.   Sexual activity: Yes    Partners: Female  Other Topics Concern   Not on file  Social History Narrative   Not on file   Social Determinants of Health   Financial Resource Strain: Not on file  Food Insecurity: Not on file  Transportation Needs: Not on file  Physical Activity: Not on file  Stress: Not on file  Social Connections: Not on file  Intimate Partner Violence: Not on file    Family History  Problem Relation Age of Onset   Breast cancer Mother    Heart disease Mother    Blindness Father    Heart disease Brother    Heart disease Maternal Grandmother    Hypertension Maternal Grandmother    Cancer Maternal Grandfather        type unknown   Hypertension Other    CAD Other       Erick Oxendine, Dorian Pod, MD 09/23/22 1001

## 2022-09-23 NOTE — ED Triage Notes (Signed)
Pt is here for cough nasal  congestion, chill, headache, lost of appetite , facial pain bilateral ear pain, diarrhea , low energy, and SOB x 6days

## 2022-09-23 NOTE — Discharge Instructions (Addendum)
Take Augmentin twice per day for 7 days Take the tessalon medicine up to 3x per day for cough

## 2022-09-29 ENCOUNTER — Ambulatory Visit (INDEPENDENT_AMBULATORY_CARE_PROVIDER_SITE_OTHER): Payer: Commercial Managed Care - HMO | Admitting: Medical

## 2022-09-29 VITALS — BP 122/80 | HR 84 | Temp 98.0°F | Resp 18 | Ht 71.0 in | Wt 248.6 lb

## 2022-09-29 DIAGNOSIS — Z0000 Encounter for general adult medical examination without abnormal findings: Secondary | ICD-10-CM

## 2022-09-29 DIAGNOSIS — Z125 Encounter for screening for malignant neoplasm of prostate: Secondary | ICD-10-CM

## 2022-09-29 DIAGNOSIS — Z23 Encounter for immunization: Secondary | ICD-10-CM

## 2022-09-29 DIAGNOSIS — R7989 Other specified abnormal findings of blood chemistry: Secondary | ICD-10-CM | POA: Diagnosis not present

## 2022-09-29 MED ORDER — FLUTICASONE PROPIONATE 50 MCG/ACT NA SUSP: 2.0000 | Freq: Every day | NASAL | 1 refills | Status: DC

## 2022-09-29 MED ORDER — SERTRALINE HCL 25 MG PO TABS: 25.0000 mg | ORAL_TABLET | Freq: Every day | ORAL | 3 refills | Status: AC

## 2022-09-29 NOTE — Patient Instructions (Addendum)
For you wellness exam today I have ordered cbc, cmp, psa and  lipid panel.  Shingrix- vaccine today.   Recommend exercise and healthy diet.  We will let you know lab results as they come in.  Follow up date appointment will be determined after lab review.    Depression- rx sertraline. Rx advisement given. Follow up in one month or sooner if needed  Sinus infection- improving with augmentin. Add on flonase nasal spray for nasal congestion. Give me update in one week if any persisting sinus pressure  Htn- bp controled on current losartan dose.  Preventive Care 71-20 Years Old, Male Preventive care refers to lifestyle choices and visits with your health care provider that can promote health and wellness. Preventive care visits are also called wellness exams. What can I expect for my preventive care visit? Counseling During your preventive care visit, your health care provider may ask about your: Medical history, including: Past medical problems. Family medical history. Current health, including: Emotional well-being. Home life and relationship well-being. Sexual activity. Lifestyle, including: Alcohol, nicotine or tobacco, and drug use. Access to firearms. Diet, exercise, and sleep habits. Safety issues such as seatbelt and bike helmet use. Sunscreen use. Work and work Statistician. Physical exam Your health care provider will check your: Height and weight. These may be used to calculate your BMI (body mass index). BMI is a measurement that tells if you are at a healthy weight. Waist circumference. This measures the distance around your waistline. This measurement also tells if you are at a healthy weight and may help predict your risk of certain diseases, such as type 2 diabetes and high blood pressure. Heart rate and blood pressure. Body temperature. Skin for abnormal spots. What immunizations do I need?  Vaccines are usually given at various ages, according to a schedule.  Your health care provider will recommend vaccines for you based on your age, medical history, and lifestyle or other factors, such as travel or where you work. What tests do I need? Screening Your health care provider may recommend screening tests for certain conditions. This may include: Lipid and cholesterol levels. Diabetes screening. This is done by checking your blood sugar (glucose) after you have not eaten for a while (fasting). Hepatitis B test. Hepatitis C test. HIV (human immunodeficiency virus) test. STI (sexually transmitted infection) testing, if you are at risk. Lung cancer screening. Prostate cancer screening. Colorectal cancer screening. Talk with your health care provider about your test results, treatment options, and if necessary, the need for more tests. Follow these instructions at home: Eating and drinking  Eat a diet that includes fresh fruits and vegetables, whole grains, lean protein, and low-fat dairy products. Take vitamin and mineral supplements as recommended by your health care provider. Do not drink alcohol if your health care provider tells you not to drink. If you drink alcohol: Limit how much you have to 0-2 drinks a day. Know how much alcohol is in your drink. In the [place] one drink equals one 12 oz bottle of beer (355 mL), one 5 oz glass of wine (148 mL), or one 1 oz glass of hard liquor (44 mL). Lifestyle Brush your teeth every morning and night with fluoride toothpaste. Floss one time each day. Exercise for at least 30 minutes 5 or more days each week. Do not use any products that contain nicotine or tobacco. These products include cigarettes, chewing tobacco, and vaping devices, such as e-cigarettes. If you need help quitting, ask your health care provider.  Do not use drugs. If you are sexually active, practice safe sex. Use a condom or other form of protection to prevent STIs. Take aspirin only as told by your health care provider. Make sure that  you understand how much to take and what form to take. Work with your health care provider to find out whether it is safe and beneficial for you to take aspirin daily. Find healthy ways to manage stress, such as: Meditation, yoga, or listening to music. Journaling. Talking to a trusted person. Spending time with friends and family. Minimize exposure to UV radiation to reduce your risk of skin cancer. Safety Always wear your seat belt while driving or riding in a vehicle. Do not drive: If you have been drinking alcohol. Do not ride with someone who has been drinking. When you are tired or distracted. While texting. If you have been using any mind-altering substances or drugs. Wear a helmet and other protective equipment during sports activities. If you have firearms in your house, make sure you follow all gun safety procedures. What's next? Go to your health care provider once a year for an annual wellness visit. Ask your health care provider how often you should have your eyes and teeth checked. Stay up to date on all vaccines. This information is not intended to replace advice given to you by your health care provider. Make sure you discuss any questions you have with your health care provider. Document Revised: 01/15/2021 Document Reviewed: 01/15/2021 Elsevier Patient Education  [place].

## 2022-09-29 NOTE — Progress Notes (Signed)
Subjective:    Patient ID: William Fuller, male    DOB: [birth year], 52 y.o.   MRN: [id number]  HPI  Pt in for wellness exam/cpe.   Pt is not fasting   Pt working at nursing home. Pt is not exercising. Pt admits not eating well. No soda or coffee. Non smoker. Rare alcohol use.     Pt has hx of colonoscopy in 2018. Pt is being followed by Marnee Spring MD   Depression phq9 score 14. He feels rt ankle fracture and surgery led to depressed mood. He was unable to work for a while and when he restarted his work he got his work hours cut and his pay. Also had some false accusations per pt. Then he lost his job and was unemployed. His Gad 7 score was 0. He had time finding job and not happy with current job.  In the past was on depression med. Stopped when he felt better. No side effects.  Pt still has nasal congestion and sinus pressure after urgent care evaluation.  ASSESSMENT & PLAN:   1. Acute non-recurrent maxillary sinusitis     -History and exam is concerning for both a sinusitis and a viral illness.  His point-of-care flu testing today is negative.  COVID testing is pending.  Will call with results if positive.  In the meantime, I think it will be prudent to treat him for sinusitis with Augmentin since he has failed the 1 week watchful waiting period.  I recommended continued use of over-the-counter medications such as Robitussin, Mucinex for symptomatic relief.  Encouraged lots of hydration and rest.  Work note given.  All questions answered and agrees to plan.   Pt states states feel like progressvely getting better now since starting the antibioic.  Htn- bp is well controlled. Pt is on losartan.       Review of Systems  Constitutional:  Negative for chills, fatigue and fever.  HENT:  Positive for congestion and sinus pressure. Negative for sinus pain, sneezing and sore throat.   Respiratory:  Negative for cough, chest tightness, shortness of breath and wheezing.    Cardiovascular:  Negative for chest pain and palpitations.  Gastrointestinal:  Negative for abdominal pain and anal bleeding.  Genitourinary:  Negative for dysuria and enuresis.  Musculoskeletal:  Negative for back pain, joint swelling, myalgias and neck stiffness.  Skin:  Negative for rash.  Neurological:  Negative for dizziness, light-headedness and headaches.  Hematological:  Negative for adenopathy. Does not bruise/bleed easily.  Psychiatric/Behavioral:  Positive for dysphoric mood. Negative for behavioral problems, confusion, sleep disturbance and suicidal ideas. The patient is not nervous/anxious.     Past Medical History:  Diagnosis Date   Allergy    Esophageal varices (HCC)    Focal nodular hyperplasia of liver    GERD (gastroesophageal reflux disease)    Hypertension    Intestinal metaplasia of gastric mucosa    Lateral epicondylitis of left elbow 07/08/2021     Social History   Socioeconomic History   Marital status: Single    Spouse name: Not on file   Number of children: 2   Years of education: Not on file   Highest education level: Not on file  Occupational History   Not on file  Tobacco Use   Smoking status: Former    Types: Cigarettes    Quit date: 06/12/2015    Years since quitting: 7.3   Smokeless tobacco: Never  Vaping Use   Vaping Use: Never used  Substance and Sexual Activity   Alcohol use: No    Alcohol/week: 0.0 standard drinks of alcohol   Drug use: Yes    Types: Marijuana    Comment: pt smokes twice a week. Each time 2 joints.   Sexual activity: Yes    Partners: Female  Other Topics Concern   Not on file  Social History Narrative   Not on file   Social Determinants of Health   Financial Resource Strain: Not on file  Food Insecurity: Not on file  Transportation Needs: Not on file  Physical Activity: Not on file  Stress: Not on file  Social Connections: Not on file  Intimate Partner Violence: Not on file    Past Surgical History:   Procedure Laterality Date   COLONOSCOPY     ESOPHAGOGASTRODUODENOSCOPY     HERNIA REPAIR     umbilical , as a child    Family History  Problem Relation Age of Onset   Breast cancer Mother    Heart disease Mother    Blindness Father    Heart disease Brother    Heart disease Maternal Grandmother    Hypertension Maternal Grandmother    Cancer Maternal Grandfather        type unknown   Hypertension Other    CAD Other     Allergies  Allergen Reactions   Naproxen Itching and Rash    Current Outpatient Medications on File Prior to Visit  Medication Sig Dispense Refill   amoxicillin-clavulanate (AUGMENTIN) 875-125 MG tablet Take 1 tablet by mouth every 12 (twelve) hours. 14 tablet 0   benzonatate (TESSALON) 100 MG capsule Take 1 capsule (100 mg total) by mouth every 8 (eight) hours. 21 capsule 0   losartan (COZAAR) 25 MG tablet Take 1 tablet (25 mg total) by mouth daily. (Patient taking differently: Take 25 mg by mouth daily. As needed) 30 tablet 3   pantoprazole (PROTONIX) 40 MG tablet Take 1 tablet (40 mg total) by mouth 2 (two) times daily. 180 tablet 3   sucralfate (CARAFATE) 1 g tablet Take 1 tablet (1 g total) by mouth 4 (four) times daily -  with meals and at bedtime for 10 days. 40 tablet 0   SUMAtriptan (IMITREX) 50 MG tablet Take 1 tablet (50 mg total) by mouth every 2 (two) hours as needed for migraine. May repeat in 2 hours if headache persists or recurs.(max 2 tab in 24 hour period) 10 tablet 0   [DISCONTINUED] fluticasone (FLONASE) 50 MCG/ACT nasal spray Place 2 sprays into both nostrils daily. 16 g 0   No current facility-administered medications on file prior to visit.    BP 122/80   Pulse 84   Temp 98 F (36.7 C)   Resp 18   Ht '5\' 11"'$  (1.803 m)   Wt 248 lb 9.6 oz (112.8 kg)   SpO2 97%   BMI 34.67 kg/m        Objective:   Physical Exam  General Mental Status- Alert. General Appearance- Not in acute distress.   Skin General: Color- Normal Color.  Moisture- Normal Moisture.  Neck Carotid Arteries- Normal color. Moisture- Normal Moisture. No carotid bruits. No JVD.  Chest and Lung Exam Auscultation: Breath Sounds:-Normal.  Cardiovascular Auscultation:Rythm- Regular. Murmurs & Other Heart Sounds:Auscultation of the heart reveals- No Murmurs.  Abdomen Inspection:-Inspeection Normal. Palpation/Percussion:Note:No mass. Palpation and Percussion of the abdomen reveal- Non Tender, Non Distended + BS, no rebound or guarding.   Neurologic Cranial Nerve exam:- CN III-XII intact(No nystagmus), symmetric  smile. Strength:- 5/5 equal and symmetric strength both upper and lower extremities.   Heent- maxillary sinus pressure.    Assessment & Plan:   Patient Instructions  For you wellness exam today I have ordered cbc, cmp, psa and  lipid panel.  Shingrix- vaccine today.   Recommend exercise and healthy diet.  We will let you know lab results as they come in.  Follow up date appointment will be determined after lab review.    Depression- rx sertraline. Rx advisement given. Follow up in one month or sooner if needed  Sinus infection- improving with augmentin. Add on flonase nasal spray for nasal congestion. Give me update in one week if any persisting sinus pressure  Htn- bp controled on current losartan dose.   [name], PA-C   [place], [place]   [id number] charge as did address depression, sinus infection and htn.

## 2022-09-29 NOTE — Addendum Note (Signed)
Addended by: Jeronimo Greaves on: 09/29/2022 02:54 PM   Modules accepted: Orders

## 2022-09-30 LAB — CBC WITH DIFFERENTIAL/PLATELET
Basophils Absolute: 0 10*3/uL (ref 0.0–0.1)
Basophils Relative: 0.4 % (ref 0.0–3.0)
Eosinophils Absolute: 0.2 10*3/uL (ref 0.0–0.7)
Eosinophils Relative: 2.1 % (ref 0.0–5.0)
HCT: 44.6 % (ref 39.0–52.0)
Hemoglobin: 14.7 g/dL (ref 13.0–17.0)
Lymphocytes Relative: 35.5 % (ref 12.0–46.0)
Lymphs Abs: 2.6 10*3/uL (ref 0.7–4.0)
MCHC: 33 g/dL (ref 30.0–36.0)
MCV: 90.6 fl (ref 78.0–100.0)
Monocytes Absolute: 0.6 10*3/uL (ref 0.1–1.0)
Monocytes Relative: 8.3 % (ref 3.0–12.0)
Neutro Abs: 3.9 10*3/uL (ref 1.4–7.7)
Neutrophils Relative %: 53.7 % (ref 43.0–77.0)
Platelets: 861 10*3/uL — ABNORMAL HIGH (ref 150.0–400.0)
RBC: 4.93 Mil/uL (ref 4.22–5.81)
RDW: 13.4 % (ref 11.5–15.5)
WBC: 7.3 10*3/uL (ref 4.0–10.5)

## 2022-09-30 LAB — COMPREHENSIVE METABOLIC PANEL
ALT: 43 U/L (ref 0–53)
AST: 21 U/L (ref 0–37)
Albumin: 3.9 g/dL (ref 3.5–5.2)
Alkaline Phosphatase: 51 U/L (ref 39–117)
BUN: 10 mg/dL (ref 6–23)
CO2: 32 mEq/L (ref 19–32)
Calcium: 10.3 mg/dL (ref 8.4–10.5)
Chloride: 101 mEq/L (ref 96–112)
Creatinine, Ser: 1.02 mg/dL (ref 0.40–1.50)
GFR: 84.29 mL/min (ref >60.00)
Glucose, Bld: 89 mg/dL (ref 70–99)
Potassium: 5.3 mEq/L — ABNORMAL HIGH (ref 3.5–5.1)
Sodium: 140 mEq/L (ref 135–145)
Total Bilirubin: 0.3 mg/dL (ref 0.2–1.2)
Total Protein: 7.3 g/dL (ref 6.0–8.3)

## 2022-09-30 LAB — LIPID PANEL
Cholesterol: 146 mg/dL (ref 0–200)
HDL: 36.2 mg/dL — ABNORMAL LOW (ref >39.00)
LDL Cholesterol: 84 mg/dL (ref 0–99)
NonHDL: 110.06
Total CHOL/HDL Ratio: 4
Triglycerides: 131 mg/dL (ref 0.0–149.0)
VLDL: 26.2 mg/dL (ref 0.0–40.0)

## 2022-09-30 LAB — PSA: PSA: 2.14 ng/mL (ref 0.10–4.00)

## 2022-10-03 MED ORDER — SODIUM POLYSTYRENE SULFONATE 15 GM/60ML PO SUSP: ORAL | 0 refills | Status: DC

## 2022-10-03 NOTE — Addendum Note (Signed)
Addended by: Anabel Halon on: 10/03/2022 05:43 AM   Modules accepted: Orders

## 2022-10-07 ENCOUNTER — Other Ambulatory Visit: Payer: Self-pay | Admitting: Family

## 2022-10-07 DIAGNOSIS — D509 Iron deficiency anemia, unspecified: Secondary | ICD-10-CM

## 2022-10-07 DIAGNOSIS — D75839 Thrombocytosis, unspecified: Secondary | ICD-10-CM

## 2022-10-12 ENCOUNTER — Inpatient Hospital Stay (HOSPITAL_BASED_OUTPATIENT_CLINIC_OR_DEPARTMENT_OTHER): Payer: Commercial Managed Care - HMO | Admitting: Family

## 2022-10-12 ENCOUNTER — Inpatient Hospital Stay: Payer: Commercial Managed Care - HMO | Attending: Hematology & Oncology

## 2022-10-12 ENCOUNTER — Encounter: Payer: Self-pay | Admitting: Family

## 2022-10-12 VITALS — BP 130/78 | HR 72 | Temp 98.4°F | Resp 17 | Ht 70.0 in | Wt 258.0 lb

## 2022-10-12 DIAGNOSIS — Z886 Allergy status to analgesic agent status: Secondary | ICD-10-CM | POA: Insufficient documentation

## 2022-10-12 DIAGNOSIS — K648 Other hemorrhoids: Secondary | ICD-10-CM | POA: Diagnosis not present

## 2022-10-12 DIAGNOSIS — D75839 Thrombocytosis, unspecified: Secondary | ICD-10-CM | POA: Insufficient documentation

## 2022-10-12 DIAGNOSIS — Z79899 Other long term (current) drug therapy: Secondary | ICD-10-CM | POA: Insufficient documentation

## 2022-10-12 DIAGNOSIS — Z803 Family history of malignant neoplasm of breast: Secondary | ICD-10-CM | POA: Diagnosis not present

## 2022-10-12 DIAGNOSIS — K5520 Angiodysplasia of colon without hemorrhage: Secondary | ICD-10-CM | POA: Insufficient documentation

## 2022-10-12 DIAGNOSIS — D509 Iron deficiency anemia, unspecified: Secondary | ICD-10-CM

## 2022-10-12 DIAGNOSIS — Z87891 Personal history of nicotine dependence: Secondary | ICD-10-CM | POA: Diagnosis not present

## 2022-10-12 DIAGNOSIS — Z809 Family history of malignant neoplasm, unspecified: Secondary | ICD-10-CM | POA: Insufficient documentation

## 2022-10-12 DIAGNOSIS — K219 Gastro-esophageal reflux disease without esophagitis: Secondary | ICD-10-CM | POA: Insufficient documentation

## 2022-10-12 DIAGNOSIS — Z821 Family history of blindness and visual loss: Secondary | ICD-10-CM | POA: Diagnosis not present

## 2022-10-12 DIAGNOSIS — Z8249 Family history of ischemic heart disease and other diseases of the circulatory system: Secondary | ICD-10-CM | POA: Diagnosis not present

## 2022-10-12 DIAGNOSIS — F1290 Cannabis use, unspecified, uncomplicated: Secondary | ICD-10-CM | POA: Diagnosis not present

## 2022-10-12 LAB — CBC WITH DIFFERENTIAL (CANCER CENTER ONLY)
Abs Immature Granulocytes: 0.07 10*3/uL (ref 0.00–0.07)
Basophils Absolute: 0.1 10*3/uL (ref 0.0–0.1)
Basophils Relative: 1 %
Eosinophils Absolute: 0.2 10*3/uL (ref 0.0–0.5)
Eosinophils Relative: 2 %
HCT: 44.4 % (ref 39.0–52.0)
Hemoglobin: 14.1 g/dL (ref 13.0–17.0)
Immature Granulocytes: 1 %
Lymphocytes Relative: 40 %
Lymphs Abs: 3.6 10*3/uL (ref 0.7–4.0)
MCH: 29 pg (ref 26.0–34.0)
MCHC: 31.8 g/dL (ref 30.0–36.0)
MCV: 91.4 fL (ref 80.0–100.0)
Monocytes Absolute: 0.6 10*3/uL (ref 0.1–1.0)
Monocytes Relative: 7 %
Neutro Abs: 4.5 10*3/uL (ref 1.7–7.7)
Neutrophils Relative %: 49 %
Platelet Count: 678 10*3/uL — ABNORMAL HIGH (ref 150–400)
RBC: 4.86 MIL/uL (ref 4.22–5.81)
RDW: 13.3 % (ref 11.5–15.5)
WBC Count: 9 10*3/uL (ref 4.0–10.5)
nRBC: 0 % (ref 0.0–0.2)

## 2022-10-12 LAB — IRON AND IRON BINDING CAPACITY (CC-WL,HP ONLY)
Iron: 81 ug/dL (ref 45–182)
Saturation Ratios: 24 % (ref 17.9–39.5)
TIBC: 339 ug/dL (ref 250–450)
UIBC: 258 ug/dL (ref 117–376)

## 2022-10-12 LAB — RETICULOCYTES
Immature Retic Fract: 18.4 % — ABNORMAL HIGH (ref 2.3–15.9)
RBC.: 4.89 MIL/uL (ref 4.22–5.81)
Retic Count, Absolute: 99.8 10*3/uL (ref 19.0–186.0)
Retic Ct Pct: 2 % (ref 0.4–3.1)

## 2022-10-12 LAB — CMP (CANCER CENTER ONLY)
ALT: 39 U/L (ref 0–44)
AST: 24 U/L (ref 15–41)
Albumin: 4.3 g/dL (ref 3.5–5.0)
Alkaline Phosphatase: 42 U/L (ref 38–126)
Anion gap: 10 (ref 5–15)
BUN: 10 mg/dL (ref 6–20)
CO2: 31 mmol/L (ref 22–32)
Calcium: 9.6 mg/dL (ref 8.9–10.3)
Chloride: 102 mmol/L (ref 98–111)
Creatinine: 1.17 mg/dL (ref 0.61–1.24)
GFR, Estimated: >60 mL/min
Glucose, Bld: 97 mg/dL (ref 70–99)
Potassium: 4 mmol/L (ref 3.5–5.1)
Sodium: 143 mmol/L (ref 135–145)
Total Bilirubin: 0.4 mg/dL (ref 0.3–1.2)
Total Protein: 7.7 g/dL (ref 6.5–8.1)

## 2022-10-12 LAB — FERRITIN: Ferritin: 229 ng/mL (ref 24–336)

## 2022-10-12 LAB — SAVE SMEAR(SSMR), FOR PROVIDER SLIDE REVIEW

## 2022-10-12 LAB — LACTATE DEHYDROGENASE: LDH: 162 U/L (ref 98–192)

## 2022-10-12 NOTE — Progress Notes (Addendum)
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  Today's platelet count is 678, WBC count 9.0,  Hgb 14.1 and MCV 91.  He denies fatigue.  He had one episode of palpitations a few weeks ago. No other symptoms.  No known history of spleen or liver disease. Spleen is still in. He states that he has never had surgery.  JAK 2 testing back in 2022 was negative with Dr. Lorenso Courier. We did recheck today.  No known familial history of elevated platelet count or bone marrow disease.  He has history of GERD with nausea (no vomiting) and abdominal discomfort. He is currently on Carafate and Protonix. He is scheduled for EGD on 3/22.  His colonoscopy was in June 2018 with Dr. Havery Moros. He had 1 polyp removed from the sigmoid colon, had a single colonic angiodysplastic lesion and internal hemorrhoids. Biopsies were negative.  No history of thyroid disease or diabetes.  No personal history of cancer. His mother and 2 of her sisters had history of breast cancer.  No fever, chills, cough, rash, dizziness, SOB, chest pain or changes in bowel or bladder habits.  No swelling, tenderness, numbness or tingling in his extremities.  No falls or syncope reported.  He quit ETOH as he noted that his hemorrhoids would bleed when he drank. No other blood loss noted.  No abnormal bruising, no petechiae.  Appetite and hydration are good. Weight is stable at 258 lbs.  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 He strips and waxes floors daily.   ROS: All other 10 point review of systems is negative.   PAST MEDICAL HISTORY:    Past Medical History:  Diagnosis Date   Allergy    Esophageal varices ([place])    Focal nodular hyperplasia of liver    GERD (gastroesophageal reflux disease)    Hypertension    Intestinal metaplasia of gastric mucosa    Lateral epicondylitis of left elbow 07/08/2021    ALLERGIES: Allergies  Allergen Reactions   Naproxen Itching and Rash      MEDICATIONS:  Current Outpatient Medications on File Prior to Visit  Medication Sig Dispense Refill   fluticasone (FLONASE) 50 MCG/ACT nasal spray Place 2 sprays into both nostrils daily. 16 g 1   losartan (COZAAR) 25 MG tablet Take 1 tablet (25 mg total) by mouth daily. (Patient taking differently: Take 25 mg by mouth daily. As needed) 30 tablet 3   pantoprazole (PROTONIX) 40 MG tablet Take 1 tablet (40 mg total) by mouth 2 (two) times daily. 180 tablet 3   sertraline (ZOLOFT) 25 MG tablet Take 1 tablet (25 mg total) by mouth daily. 30 tablet 3   sodium polystyrene (KAYEXALATE) 15 GM/60ML suspension 60 ml po daily for 4 days. 240 mL 0   SUMAtriptan (IMITREX) 50 MG tablet Take 1 tablet (50 mg total) by mouth every 2 (two) hours as needed for migraine. May repeat in 2 hours if headache persists or recurs.(max 2 tab in 24 hour period) 10 tablet 0   No current facility-administered medications on file prior to visit.  PAST SURGICAL HISTORY Past Surgical History:  Procedure Laterality Date   COLONOSCOPY     ESOPHAGOGASTRODUODENOSCOPY     HERNIA REPAIR     umbilical , as a child    FAMILY HISTORY: Family History  Problem Relation Age of Onset   Breast cancer Mother    Heart disease Mother    Blindness Father    Heart disease Brother    Heart disease Maternal Grandmother    Hypertension Maternal Grandmother    Cancer Maternal Grandfather        type unknown   Hypertension Other    CAD Other     SOCIAL HISTORY:  reports that he quit smoking about 7 years ago. His smoking use included cigarettes. He has never used smokeless  tobacco. He reports current drug use. Drug: Marijuana. He reports that he does not drink alcohol.  PERFORMANCE STATUS: The patient's performance status is 0 - Asymptomatic  PHYSICAL EXAM: Most Recent Vital Signs: There were no vitals taken for this visit. There were no vitals taken for this visit.  General Appearance:    Alert, cooperative, no distress, appears stated age  Head:    Normocephalic, without obvious abnormality, atraumatic  Eyes:    PERRL, conjunctiva/corneas clear, EOM's intact, fundi    benign, both eyes             Throat:   Lips, mucosa, and tongue normal; teeth and gums normal  Neck:   Supple, symmetrical, trachea midline, no adenopathy;       thyroid:  No enlargement/tenderness/nodules; no carotid   bruit or JVD  Back:     Symmetric, no curvature, ROM normal, no CVA tenderness  Lungs:     Clear to auscultation bilaterally, respirations unlabored  Chest wall:    No tenderness or deformity  Heart:    Regular rate and rhythm, S1 and S2 normal, no murmur, rub   or gallop  Abdomen:     Soft, non-tender, bowel sounds active all four quadrants,    no masses, no organomegaly        Extremities:   Extremities normal, atraumatic, no cyanosis or edema  Pulses:   2+ and symmetric all extremities  Skin:   Skin color, texture, turgor normal, no rashes or lesions  Lymph nodes:   Cervical, supraclavicular, and axillary nodes normal  Neurologic:   CNII-XII intact. Normal strength, sensation and reflexes      throughout    LABORATORY DATA:  Results for orders placed or performed in visit on 10/12/22 (from the past 48 hour(s))  Save Smear for Provider Slide Review     Status: None   Collection Time: 10/12/22 10:28 AM  Result Value Ref Range   Smear Review SMEAR STAINED AND AVAILABLE FOR REVIEW     Comment: Performed at [facility] at [facility], [address] [place], [place] [address]  Reticulocytes     Status: Abnormal   Collection Time:  10/12/22 10:28 AM  Result Value Ref Range   Retic Ct Pct 2.0 0.4 - 3.1 %   RBC. 4.89 4.22 - 5.81 MIL/uL   Retic Count, Absolute 99.8 19.0 - 186.0 K/uL   Immature Retic Fract 18.4 (H) 2.3 - 15.9 %    Comment: Performed at [facility] at [facility], [address], [place], [place] [address]  CMP ([place] only)     Status: None   Collection Time: 10/12/22 10:28 AM  Result  Value Ref Range   Sodium 143 135 - 145 mmol/L   Potassium 4.0 3.5 - 5.1 mmol/L   Chloride 102 98 - 111 mmol/L   CO2 31 22 - 32 mmol/L   Glucose, Bld 97 70 - 99 mg/dL    Comment: Glucose reference range applies only to samples taken after fasting for at least 8 hours.   BUN 10 6 - 20 mg/dL   Creatinine 1.17 0.61 - 1.24 mg/dL   Calcium 9.6 8.9 - 10.3 mg/dL   Total Protein 7.7 6.5 - 8.1 g/dL   Albumin 4.3 3.5 - 5.0 g/dL   AST 24 15 - 41 U/L   ALT 39 0 - 44 U/L   Alkaline Phosphatase 42 38 - 126 U/L   Total Bilirubin 0.4 0.3 - 1.2 mg/dL   GFR, Estimated >60 >60 mL/min    Comment: (NOTE) Calculated using the CKD-EPI Creatinine Equation (2021)    Anion gap 10 5 - 15    Comment: Performed at [facility] at [facility], [address] [place], [place] [address]  CBC with Differential ([place] Only)     Status: Abnormal   Collection Time: 10/12/22 10:28 AM  Result Value Ref Range   WBC Count 9.0 4.0 - 10.5 K/uL   RBC 4.86 4.22 - 5.81 MIL/uL   Hemoglobin 14.1 13.0 - 17.0 g/dL   HCT 44.4 39.0 - 52.0 %   MCV 91.4 80.0 - 100.0 fL   MCH 29.0 26.0 - 34.0 pg   MCHC 31.8 30.0 - 36.0 g/dL   RDW 13.3 11.5 - 15.5 %   Platelet Count 678 (H) 150 - 400 K/uL   nRBC 0.0 0.0 - 0.2 %   Neutrophils Relative % 49 %   Neutro Abs 4.5 1.7 - 7.7 K/uL   Lymphocytes Relative 40 %   Lymphs Abs 3.6 0.7 - 4.0 K/uL   Monocytes Relative 7 %   Monocytes Absolute 0.6 0.1 - 1.0 K/uL   Eosinophils Relative 2 %   Eosinophils Absolute 0.2 0.0 - 0.5 K/uL   Basophils Relative 1  %   Basophils Absolute 0.1 0.0 - 0.1 K/uL   Immature Granulocytes 1 %   Abs Immature Granulocytes 0.07 0.00 - 0.07 K/uL    Comment: Performed at [facility] at [facility], [address] [place], [place] [address]      RADIOGRAPHY: No results found.     PATHOLOGY: None  ASSESSMENT/PLAN: Mr. Andreotti is a pleasant 52 yo African American gentleman with history of thrombocytosis for at least 12 years.  We will check his JAK 2 again along with iron studies.  We will also get and US of the abdomen to assess his liver and spleen.  We will consider bone marrow biopsy of work-up is inconclusive.  Blood smear reviewed by Dr. Marin Olp and no evidence of malignancy noted. He had the rare large platelet.  Follow-up pending results.   All questions were answered. The patient knows to call the clinic with any problems, questions or concerns. We can certainly see the patient much sooner if necessary.  The patient was discussed with Dr. Marin Olp and he is in agreement with the aforementioned.   Lottie Dawson, NP

## 2022-10-15 ENCOUNTER — Ambulatory Visit (HOSPITAL_BASED_OUTPATIENT_CLINIC_OR_DEPARTMENT_OTHER)
Admission: RE | Admit: 2022-10-15 | Discharge: 2022-10-15 | Disposition: A | Discharge status: Home / Self Care | Payer: Commercial Managed Care - HMO | Source: Ambulatory Visit | Attending: Family | Admitting: Family

## 2022-10-15 DIAGNOSIS — D75839 Thrombocytosis, unspecified: Secondary | ICD-10-CM | POA: Insufficient documentation

## 2022-10-21 LAB — JAK 2 V617F (GENPATH)

## 2022-10-23 ENCOUNTER — Telehealth: Payer: Self-pay | Admitting: Family

## 2022-10-23 NOTE — Telephone Encounter (Signed)
Left message with call back number to discuss JAK 2 test results and we will also be recommending he have a bone marrow biopsy. Will go over when patient calls back.

## 2022-10-26 ENCOUNTER — Telehealth: Payer: Self-pay | Admitting: Family

## 2022-10-26 ENCOUNTER — Other Ambulatory Visit: Payer: Self-pay | Admitting: Family

## 2022-10-26 DIAGNOSIS — D75839 Thrombocytosis, unspecified: Secondary | ICD-10-CM

## 2022-10-26 NOTE — Telephone Encounter (Signed)
I was able to speak with the patient and go over recent JAK 2 testing which was negative. At this time, after speaking with Dr. Marin Olp, we recommend moving forward with bone marrow biopsy. Patient is in agreement with the plan. Orders are in and scheduling message sent. No questions or concerns at this time. Patient appreciative of call.

## 2022-10-28 ENCOUNTER — Telehealth: Payer: Self-pay

## 2022-10-28 ENCOUNTER — Ambulatory Visit: Payer: Commercial Managed Care - HMO | Admitting: Medical

## 2022-10-28 NOTE — Telephone Encounter (Signed)
No show letter

## 2022-11-04 ENCOUNTER — Other Ambulatory Visit: Payer: Self-pay | Admitting: Radiology

## 2022-11-04 DIAGNOSIS — D75839 Thrombocytosis, unspecified: Secondary | ICD-10-CM

## 2022-11-05 ENCOUNTER — Ambulatory Visit (HOSPITAL_COMMUNITY)
Admission: RE | Admit: 2022-11-05 | Discharge: 2022-11-05 | Disposition: A | Discharge status: Home / Self Care | Payer: Commercial Managed Care - HMO | Source: Ambulatory Visit | Attending: Family | Admitting: Family

## 2022-11-05 ENCOUNTER — Encounter (HOSPITAL_COMMUNITY): Payer: Self-pay

## 2022-11-05 ENCOUNTER — Other Ambulatory Visit: Payer: Self-pay

## 2022-11-05 DIAGNOSIS — K219 Gastro-esophageal reflux disease without esophagitis: Secondary | ICD-10-CM | POA: Diagnosis not present

## 2022-11-05 DIAGNOSIS — I10 Essential (primary) hypertension: Secondary | ICD-10-CM | POA: Insufficient documentation

## 2022-11-05 DIAGNOSIS — K7689 Other specified diseases of liver: Secondary | ICD-10-CM | POA: Insufficient documentation

## 2022-11-05 DIAGNOSIS — Z87891 Personal history of nicotine dependence: Secondary | ICD-10-CM | POA: Diagnosis not present

## 2022-11-05 DIAGNOSIS — D75839 Thrombocytosis, unspecified: Secondary | ICD-10-CM | POA: Insufficient documentation

## 2022-11-05 HISTORY — PX: IR BONE MARROW BIOPSY & ASPIRATION: IMG5727

## 2022-11-05 LAB — CBC WITH DIFFERENTIAL/PLATELET
Abs Immature Granulocytes: 0.03 10*3/uL (ref 0.00–0.07)
Basophils Absolute: 0 10*3/uL (ref 0.0–0.1)
Basophils Relative: 0 %
Eosinophils Absolute: 0.2 10*3/uL (ref 0.0–0.5)
Eosinophils Relative: 3 %
HCT: 43.6 % (ref 39.0–52.0)
Hemoglobin: 14.3 g/dL (ref 13.0–17.0)
Immature Granulocytes: 0 %
Lymphocytes Relative: 41 %
Lymphs Abs: 2.8 10*3/uL (ref 0.7–4.0)
MCH: 29.7 pg (ref 26.0–34.0)
MCHC: 32.8 g/dL (ref 30.0–36.0)
MCV: 90.5 fL (ref 80.0–100.0)
Monocytes Absolute: 0.4 10*3/uL (ref 0.1–1.0)
Monocytes Relative: 6 %
Neutro Abs: 3.4 10*3/uL (ref 1.7–7.7)
Neutrophils Relative %: 50 %
Platelets: 557 10*3/uL — ABNORMAL HIGH (ref 150–400)
RBC: 4.82 MIL/uL (ref 4.22–5.81)
RDW: 13.5 % (ref 11.5–15.5)
WBC: 6.9 10*3/uL (ref 4.0–10.5)
nRBC: 0 % (ref 0.0–0.2)

## 2022-11-05 MED ORDER — MIDAZOLAM HCL 2 MG/2ML IJ SOLN
INTRAMUSCULAR | Status: AC | PRN
  Administered 2022-11-05 (×2): 1 mg via INTRAVENOUS
  Administered 2022-11-05: 1.5 mg via INTRAVENOUS

## 2022-11-05 MED ORDER — MIDAZOLAM HCL 2 MG/2ML IJ SOLN
INTRAMUSCULAR | Status: AC
  Filled 2022-11-05: qty 2

## 2022-11-05 MED ORDER — LIDOCAINE HCL (PF) 1 % IJ SOLN
INTRAMUSCULAR | Status: AC
  Filled 2022-11-05: qty 30

## 2022-11-05 MED ORDER — FENTANYL CITRATE (PF) 100 MCG/2ML IJ SOLN
INTRAMUSCULAR | Status: AC
  Filled 2022-11-05: qty 2

## 2022-11-05 MED ORDER — LIDOCAINE HCL (PF) 1 % IJ SOLN
10.0000 mL | Freq: Once | INTRAMUSCULAR | Status: AC
  Administered 2022-11-05: 10 mL

## 2022-11-05 MED ORDER — FENTANYL CITRATE (PF) 100 MCG/2ML IJ SOLN
INTRAMUSCULAR | Status: AC | PRN
  Administered 2022-11-05: 50 ug via INTRAVENOUS
  Administered 2022-11-05: 75 ug via INTRAVENOUS
  Administered 2022-11-05: 50 ug via INTRAVENOUS

## 2022-11-05 MED ORDER — SODIUM CHLORIDE 0.9 % IV SOLN: INTRAVENOUS | Status: DC

## 2022-11-05 NOTE — Discharge Instructions (Signed)
Please call Interventional Radiology clinic [phone] with any questions or concerns. ? ?You may remove your dressing and shower tomorrow. ? ? ?Bone Marrow Aspiration and Bone Marrow Biopsy, Adult, Care After ?This sheet gives you information about how to care for yourself after your procedure. Your health care provider may also give you more specific instructions. If you have problems or questions, contact your health care provider. ?What can I expect after the procedure? ?After the procedure, it is common to have: ?Mild pain and tenderness. ?Swelling. ?Bruising. ?Follow these instructions at home: ?Puncture site care ?Follow instructions from your health care provider about how to take care of the puncture site. Make sure you: ?Wash your hands with soap and water before and after you change your bandage (dressing). If soap and water are not available, use hand sanitizer. ?Change your dressing as told by your health care provider. ?Check your puncture site every day for signs of infection. Check for: ?More redness, swelling, or pain. ?Fluid or blood. ?Warmth. ?Pus or a bad smell.   ?Activity ?Return to your normal activities as told by your health care provider. Ask your health care provider what activities are safe for you. ?Do not lift anything that is heavier than 10 lb (4.5 kg), or the limit that you are told, until your health care provider says that it is safe. ?Do not drive for 24 hours if you were given a sedative during your procedure. ?General instructions ?Take over-the-counter and prescription medicines only as told by your health care provider. ?Do not take baths, swim, or use a hot tub until your health care provider approves. Ask your health care provider if you may take showers. You may only be allowed to take sponge baths. ?If directed, put ice on the affected area. To do this: ?Put ice in a plastic bag. ?Place a towel between your skin and the bag. ?Leave the ice on for 20 minutes, 2-3 times a  day. ?Keep all follow-up visits as told by your health care provider. This is important.   ?Contact a health care provider if: ?Your pain is not controlled with medicine. ?You have a fever. ?You have more redness, swelling, or pain around the puncture site. ?You have fluid or blood coming from the puncture site. ?Your puncture site feels warm to the touch. ?You have pus or a bad smell coming from the puncture site. ?Summary ?After the procedure, it is common to have mild pain, tenderness, swelling, and bruising. ?Follow instructions from your health care provider about how to take care of the puncture site and what activities are safe for you. ?Take over-the-counter and prescription medicines only as told by your health care provider. ?Contact a health care provider if you have any signs of infection, such as fluid or blood coming from the puncture site. ?This information is not intended to replace advice given to you by your health care provider. Make sure you discuss any questions you have with your health care provider. ?Document Revised: 12/06/2018 Document Reviewed: 12/06/2018 ?Elsevier Patient Education ? 2021 Elsevier Inc. ? ? ?Moderate Conscious Sedation, Adult, Care After ?This sheet gives you information about how to care for yourself after your procedure. Your health care provider may also give you more specific instructions. If you have problems or questions, contact your health care provider. ?What can I expect after the procedure? ?After the procedure, it is common to have: ?Sleepiness for several hours. ?Impaired judgment for several hours. ?Difficulty with balance. ?Vomiting if you eat too   soon. ?Follow these instructions at home: ?For the time period you were told by your health care provider: ?Rest. ?Do not participate in activities where you could fall or become injured. ?Do not drive or use machinery. ?Do not drink alcohol. ?Do not take sleeping pills or medicines that cause drowsiness. ?Do not  make important decisions or sign legal documents. ?Do not take care of children on your own.  ?  ?  ?Eating and drinking ?Follow the diet recommended by your health care provider. ?Drink enough fluid to keep your urine pale yellow. ?If you vomit: ?Drink water, juice, or soup when you can drink without vomiting. ?Make sure you have little or no nausea before eating solid foods.   ?General instructions ?Take over-the-counter and prescription medicines only as told by your health care provider. ?Have a responsible adult stay with you for the time you are told. It is important to have someone help care for you until you are awake and alert. ?Do not smoke. ?Keep all follow-up visits as told by your health care provider. This is important. ?Contact a health care provider if: ?You are still sleepy or having trouble with balance after 24 hours. ?You feel light-headed. ?You keep feeling nauseous or you keep vomiting. ?You develop a rash. ?You have a fever. ?You have redness or swelling around the IV site. ?Get help right away if: ?You have trouble breathing. ?You have new-onset confusion at home. ?Summary ?After the procedure, it is common to feel sleepy, have impaired judgment, or feel nauseous if you eat too soon. ?Rest after you get home. Know the things you should not do after the procedure. ?Follow the diet recommended by your health care provider and drink enough fluid to keep your urine pale yellow. ?Get help right away if you have trouble breathing or new-onset confusion at home. ?This information is not intended to replace advice given to you by your health care provider. Make sure you discuss any questions you have with your health care provider. ?Document Revised: 11/17/2019 Document Reviewed: 06/15/2019 ?Elsevier Patient Education ? 2021 Elsevier Inc.  ?

## 2022-11-05 NOTE — Sedation Documentation (Signed)
Sample #1 obtained 

## 2022-11-05 NOTE — Consult Note (Signed)
Chief Complaint: Patient was seen in consultation today for image guided bone marrow biopsy  Referring Physician(s): Ennever,P  Supervising Physician: Michaelle Birks  Patient Status: [facility] - Out-pt  History of Present Illness: William Fuller is a 52 y.o. male with past medical history of esophageal varices, focal nodular hyperplasia of liver, GERD, hypertension, and persistent thrombocytosis of uncertain etiology who presents today for image guided bone marrow biopsy for further evaluation.  Past Medical History:  Diagnosis Date   Allergy    Esophageal varices    Focal nodular hyperplasia of liver    GERD (gastroesophageal reflux disease)    Hypertension    Intestinal metaplasia of gastric mucosa    Lateral epicondylitis of left elbow 07/08/2021    Past Surgical History:  Procedure Laterality Date   COLONOSCOPY     ESOPHAGOGASTRODUODENOSCOPY     HERNIA REPAIR     umbilical , as a child    Allergies: Naproxen  Medications: Prior to Admission medications   Medication Sig Start Date End Date Taking? Authorizing Provider  pantoprazole (PROTONIX) 40 MG tablet Take 1 tablet (40 mg total) by mouth 2 (two) times daily. 01/16/22  Yes Saguier, Percell [place], PA-C  fluticasone (FLONASE) 50 MCG/ACT nasal spray Place 2 sprays into both nostrils daily. 09/29/22   Saguier, Percell [place], PA-C  losartan (COZAAR) 25 MG tablet Take 1 tablet (25 mg total) by mouth daily. Patient taking differently: Take 25 mg by mouth daily. As needed 04/11/20   Saguier, Percell [place], PA-C  sertraline (ZOLOFT) 25 MG tablet Take 1 tablet (25 mg total) by mouth daily. 09/29/22   Saguier, Percell [place], PA-C  sodium polystyrene (KAYEXALATE) 15 GM/60ML suspension 60 ml po daily for 4 days. 10/03/22   Saguier, Percell [place], PA-C  SUMAtriptan (IMITREX) 50 MG tablet Take 1 tablet (50 mg total) by mouth every 2 (two) hours as needed for migraine. May repeat in 2 hours if headache persists or recurs.(max 2 tab in 24 hour period) 08/12/18   Saguier,  Percell [place], PA-C     Family History  Problem Relation Age of Onset   Breast cancer Mother    Heart disease Mother    Blindness Father    Heart disease Brother    Heart disease Maternal Grandmother    Hypertension Maternal Grandmother    Cancer Maternal Grandfather        type unknown   Hypertension Other    CAD Other     Social History   Socioeconomic History   Marital status: Single    Spouse name: Not on file   Number of children: 2   Years of education: Not on file   Highest education level: Some college, no degree  Occupational History   Not on file  Tobacco Use   Smoking status: Former    Types: Cigarettes    Quit date: 06/12/2015    Years since quitting: 7.4   Smokeless tobacco: Never  Vaping Use   Vaping Use: Never used  Substance and Sexual Activity   Alcohol use: No    Alcohol/week: 0.0 standard drinks of alcohol   Drug use: Yes    Types: Marijuana    Comment: pt smokes twice a week. Each time 2 joints.   Sexual activity: Yes    Partners: Female  Other Topics Concern   Not on file  Social History Narrative   Not on file   Social Determinants of Health   Financial Resource Strain: High Risk (10/21/2022)   Overall Financial Resource Strain (CARDIA)  Difficulty of Paying Living Expenses: Hard  Food Insecurity: Food Insecurity Present (10/21/2022)   Hunger Vital Sign    Worried About Running Out of Food in the Last Year: Often true    Ran Out of Food in the Last Year: Never true  Transportation Needs: No Transportation Needs (10/21/2022)   PRAPARE - Hydrologist (Medical): No    Lack of Transportation (Non-Medical): No  Physical Activity: Insufficiently Active (10/21/2022)   Exercise Vital Sign    Days of Exercise per Week: 4 days    Minutes of Exercise per Session: 30 min  Stress: Stress Concern Present (10/21/2022)   [place]    Feeling of Stress : Very  much  Social Connections: Socially Isolated (10/21/2022)   Social Connection and Isolation Panel [NHANES]    Frequency of Communication with Friends and Family: Twice a week    Frequency of Social Gatherings with Friends and Family: Twice a week    Attends Religious Services: Never    Marine scientist or Organizations: No    Attends Music therapist: Never    Marital Status: Divorced      Review of Systems denies fever, headache, chest pain, dyspnea, cough, abdominal/back pain, nausea, vomiting or bleeding  Vital Signs: Ht 5\' 10"  (1.778 m)   Wt 247 lb (112 kg)   BMI 35.44 kg/m   Code Status: FULL CODE   Physical Exam: Awake, alert.  Chest clear to auscultation bilaterally.  Heart with regular rate and rhythm.  Abdomen soft, positive bowel sounds, nontender.  No lower extremity edema.  Imaging: US Abdomen Complete  Result Date: 10/16/2022 CLINICAL DATA:  Thrombocytosis EXAM: ABDOMEN ULTRASOUND COMPLETE COMPARISON:  MRI abdomen 04/26/2018 FINDINGS: Gallbladder: No gallstones or wall thickening visualized. No sonographic Murphy sign noted by sonographer. Common bile duct: Diameter: 2 mm Liver: No focal lesion identified. Within normal limits in parenchymal echogenicity. Portal vein is patent on color Doppler imaging with normal direction of blood flow towards the liver. IVC: No abnormality visualized. Pancreas: Visualized portion unremarkable. Spleen: Size and appearance within normal limits. Right Kidney: Length: 12.3 cm. Echogenicity within normal limits. No mass or hydronephrosis visualized. Left Kidney: Length: 12.6 cm. Echogenicity within normal limits. No mass or hydronephrosis visualized. Abdominal aorta: No aneurysm visualized. Other findings: None. IMPRESSION: 1. No cholelithiasis or sonographic evidence for acute cholecystitis. 2. No acute process. Electronically Signed   By: Lovey Newcomer M.D.   On: 10/16/2022 09:32    Labs:  CBC: Recent Labs     12/31/21 2102 09/29/22 1449 10/12/22 1028 11/05/22 0650  WBC 8.1 7.3 9.0 6.9  HGB 14.9 14.7 14.1 14.3  HCT 45.7 44.6 44.4 43.6  PLT 522* 861.0* 678* 557*    COAGS: No results for input(s): "INR", "APTT" in the last 8760 hours.  BMP: Recent Labs    12/31/21 2102 09/29/22 1449 10/12/22 1028  NA 139 140 143  K 3.9 5.3 No hemolysis seen* 4.0  CL 106 101 102  CO2 27 32 31  GLUCOSE 94 89 97  BUN 10 10 10   CALCIUM 9.2 10.3 9.6  CREATININE 1.18 1.02 1.17  GFRNONAA >60  --  >60    LIVER FUNCTION TESTS: Recent Labs    09/29/22 1449 10/12/22 1028  BILITOT 0.3 0.4  AST 21 24  ALT 43 39  ALKPHOS 51 42  PROT 7.3 7.7  ALBUMIN 3.9 4.3    TUMOR MARKERS: No  results for input(s): "AFPTM", "CEA", "CA199", "CHROMGRNA" in the last 8760 hours.  Assessment and Plan: 52 y.o. male with past medical history of esophageal varices, focal nodular hyperplasia of liver, GERD, hypertension, and persistent thrombocytosis of uncertain etiology who presents today for image guided bone marrow biopsy for further evaluation.Risks and benefits of procedure was discussed with the patient including, but not limited to bleeding, infection, damage to adjacent structures or low yield requiring additional tests.  All of the questions were answered and there is agreement to proceed.  Consent signed and in chart.    Thank you for this interesting consult.  I greatly enjoyed meeting William Fuller and look forward to participating in their care.  A copy of this report was sent to the requesting provider on this date.  Electronically Signed: D. Rowe Robert, PA-C 11/05/2022, 8:28 AM   I spent a total of   20 minutes  in face to face in clinical consultation, greater than 50% of which was counseling/coordinating care for image guided bone marrow biopsy

## 2022-11-05 NOTE — Procedures (Signed)
Vascular and Interventional Radiology Procedure Note  Patient: Tarrell Leadbeater DOB: [birth year] Medical Record Number: [id number] Note Date/Time: 11/05/22 9:03 AM   Performing Physician: Michaelle Birks, MD Assistant(s): None  Diagnosis: Thrombocytosis   Procedure: BONE MARROW ASPIRATION and BIOPSY  Anesthesia: Conscious Sedation Complications: None Estimated Blood Loss: Minimal Specimens: Sent for Pathology  Findings:  Successful Fluoroscopy-guided bone marrow aspiration and biopsy A total of 1 cores were obtained. Hemostasis of the tract was achieved using Manual Pressure.  Plan: Bed rest for 1 hours.  See detailed procedure note with images in PACS. The patient tolerated the procedure well without incident or complication and was returned to Recovery in stable condition.    Michaelle Birks, MD [facility] Radiology   Pager. [place]

## 2022-11-05 NOTE — Sedation Documentation (Signed)
Sample #2 obtained 

## 2022-11-13 LAB — SURGICAL PATHOLOGY

## 2022-11-16 ENCOUNTER — Encounter (HOSPITAL_COMMUNITY): Payer: Self-pay | Admitting: Hematology & Oncology

## 2022-11-16 ENCOUNTER — Telehealth: Payer: Self-pay | Admitting: *Deleted

## 2022-11-16 ENCOUNTER — Encounter: Payer: Self-pay | Admitting: *Deleted

## 2022-11-16 NOTE — Telephone Encounter (Signed)
-----   Message from Erenest Blank, NP sent at 11/16/2022  4:31 PM EDT ----- Bone marrow biopsy was negative!!!! WOO HOO!!!! We will just continue to watch and plan to see him again for follow-up with MD and lab in 4 months. Thank you!   ----- Message ----- From: Interface, Lab In Three Zero One Sent: 11/13/2022   4:00 PM EDT To: Erenest Blank, NP

## 2022-11-16 NOTE — Telephone Encounter (Signed)
As noted below by Eileen Stanford, NP, I informed the patient that the bone marrow biopsy was negative! We will continue to watch and plan to see you again for follow-up with MD and lab in four months. He verbalized understanding.

## 2022-11-18 ENCOUNTER — Telehealth: Payer: Self-pay | Admitting: *Deleted

## 2022-11-18 NOTE — Telephone Encounter (Signed)
-----   Message from Avon Gully sent at 11/18/2022 10:16 AM EDT ----- Scheduled and confirmed. ----- Message ----- From: Erenest Blank, NP Sent: 11/16/2022   4:32 PM EDT To: Avon Gully; Onc Nurse Hp  Bone marrow biopsy was negative!!!! WOO HOO!!!! We will just continue to watch and plan to see him again for follow-up with MD and lab in 4 months. Thank you!   ----- Message ----- From: Interface, Lab In Three Zero One Sent: 11/13/2022   4:00 PM EDT To: Erenest Blank, NP

## 2023-01-15 ENCOUNTER — Inpatient Hospital Stay: Payer: Commercial Managed Care - HMO | Admitting: Hematology and Oncology

## 2023-01-15 ENCOUNTER — Inpatient Hospital Stay: Payer: Commercial Managed Care - HMO | Attending: Hematology & Oncology

## 2023-01-15 ENCOUNTER — Other Ambulatory Visit: Payer: Self-pay | Admitting: Hematology and Oncology

## 2023-01-15 ENCOUNTER — Other Ambulatory Visit: Payer: Self-pay

## 2023-01-15 VITALS — BP 145/82 | HR 76 | Temp 98.2°F | Resp 19 | Ht 70.0 in | Wt 254.0 lb

## 2023-01-15 DIAGNOSIS — Z803 Family history of malignant neoplasm of breast: Secondary | ICD-10-CM | POA: Insufficient documentation

## 2023-01-15 DIAGNOSIS — F1290 Cannabis use, unspecified, uncomplicated: Secondary | ICD-10-CM | POA: Insufficient documentation

## 2023-01-15 DIAGNOSIS — D75839 Thrombocytosis, unspecified: Secondary | ICD-10-CM | POA: Diagnosis not present

## 2023-01-15 DIAGNOSIS — Z809 Family history of malignant neoplasm, unspecified: Secondary | ICD-10-CM | POA: Insufficient documentation

## 2023-01-15 DIAGNOSIS — Z8249 Family history of ischemic heart disease and other diseases of the circulatory system: Secondary | ICD-10-CM | POA: Diagnosis not present

## 2023-01-15 DIAGNOSIS — Z821 Family history of blindness and visual loss: Secondary | ICD-10-CM | POA: Insufficient documentation

## 2023-01-15 DIAGNOSIS — Z5986 Financial insecurity: Secondary | ICD-10-CM | POA: Insufficient documentation

## 2023-01-15 DIAGNOSIS — Z79899 Other long term (current) drug therapy: Secondary | ICD-10-CM | POA: Diagnosis not present

## 2023-01-15 DIAGNOSIS — K219 Gastro-esophageal reflux disease without esophagitis: Secondary | ICD-10-CM | POA: Insufficient documentation

## 2023-01-15 DIAGNOSIS — Z886 Allergy status to analgesic agent status: Secondary | ICD-10-CM | POA: Insufficient documentation

## 2023-01-15 DIAGNOSIS — Z87891 Personal history of nicotine dependence: Secondary | ICD-10-CM | POA: Insufficient documentation

## 2023-01-15 LAB — CBC WITH DIFFERENTIAL (CANCER CENTER ONLY)
Abs Immature Granulocytes: 0.04 10*3/uL (ref 0.00–0.07)
Basophils Absolute: 0 10*3/uL (ref 0.0–0.1)
Basophils Relative: 1 %
Eosinophils Absolute: 0.2 10*3/uL (ref 0.0–0.5)
Eosinophils Relative: 2 %
HCT: 43.9 % (ref 39.0–52.0)
Hemoglobin: 14.7 g/dL (ref 13.0–17.0)
Immature Granulocytes: 1 %
Lymphocytes Relative: 44 %
Lymphs Abs: 3.4 10*3/uL (ref 0.7–4.0)
MCH: 29.9 pg (ref 26.0–34.0)
MCHC: 33.5 g/dL (ref 30.0–36.0)
MCV: 89.4 fL (ref 80.0–100.0)
Monocytes Absolute: 0.5 10*3/uL (ref 0.1–1.0)
Monocytes Relative: 7 %
Neutro Abs: 3.4 10*3/uL (ref 1.7–7.7)
Neutrophils Relative %: 45 %
Platelet Count: 515 10*3/uL — ABNORMAL HIGH (ref 150–400)
RBC: 4.91 MIL/uL (ref 4.22–5.81)
RDW: 13.6 % (ref 11.5–15.5)
WBC Count: 7.5 10*3/uL (ref 4.0–10.5)
nRBC: 0 % (ref 0.0–0.2)

## 2023-01-15 LAB — CMP (CANCER CENTER ONLY)
ALT: 29 U/L (ref 0–44)
AST: 25 U/L (ref 15–41)
Albumin: 4 g/dL (ref 3.5–5.0)
Alkaline Phosphatase: 46 U/L (ref 38–126)
Anion gap: 5 (ref 5–15)
BUN: 7 mg/dL (ref 6–20)
CO2: 30 mmol/L (ref 22–32)
Calcium: 9.5 mg/dL (ref 8.9–10.3)
Chloride: 105 mmol/L (ref 98–111)
Creatinine: 1.05 mg/dL (ref 0.61–1.24)
GFR, Estimated: >60 mL/min (ref >60)
Glucose, Bld: 106 mg/dL — ABNORMAL HIGH (ref 70–99)
Potassium: 4.2 mmol/L (ref 3.5–5.1)
Sodium: 140 mmol/L (ref 135–145)
Total Bilirubin: 0.5 mg/dL (ref 0.3–1.2)
Total Protein: 6.9 g/dL (ref 6.5–8.1)

## 2023-01-15 NOTE — Progress Notes (Signed)
[facility] [phone]   [phone]  PROGRESS NOTE  Patient Care Team: Marisue Brooklyn as PCP - General (Internal Medicine)  Hematological/Oncological History # Thrombocytosis # Relative Lymphocytosis 10/18/2009: WBC 8.9, Hgb 15.5, MCV 93.5, Plt 578 (first CBC on record) 02/22/2015: WBC 5.9, Hgb 14.0, MCV 89.7, Plt 478 10/07/2018: WBC 7.2, Hgb 14.8, MCV 90.7, Plt 755 09/26/2020: WBC 7.7, Hgb 14.8, MCV 92, Plt 545 10/10/2020: establish care with Dr. Leonides Schanz. Plt 525 01/13/2021: Plt 539   Interval History:  Elliot Meldrum 52 y.o. male with medical history significant for thrombocytosis who presents for a follow up visit. The patient's last visit was on 01/14/2022. In the interim since the last visit Mr. Schueler has had no changes in his health.   On exam today Mr. Florea reports he has been well overall in the interim since her last visit 1 year ago.  He had no major changes in his health.  He reports it is "steady as it goes".  He notes his energy levels are good and he continues to work without difficulty.  He is able to do his day-to-day activities without any trouble.  He notes that his appetite has been good and his weight has been steady.  He has been having some issues with acid reflux and did undergo an EGD last month.  He continues to take a baby aspirin once daily.  Overall he has no questions concerns or complaints today.  He is at his baseline.  He currently denies any fevers, chills, sweats, nausea, vomiting or diarrhea.  Full 10 point ROS is listed below.  MEDICAL HISTORY:  Past Medical History:  Diagnosis Date   Allergy    Esophageal varices (HCC)    Focal nodular hyperplasia of liver    GERD (gastroesophageal reflux disease)    Hypertension    Intestinal metaplasia of gastric mucosa    Lateral epicondylitis of left elbow 07/08/2021    SURGICAL HISTORY: Past Surgical History:  Procedure Laterality Date   COLONOSCOPY      ESOPHAGOGASTRODUODENOSCOPY     HERNIA REPAIR     umbilical , as a child   IR BONE MARROW BIOPSY & ASPIRATION  11/05/2022    SOCIAL HISTORY: Social History   Socioeconomic History   Marital status: Single    Spouse name: Not on file   Number of children: 2   Years of education: Not on file   Highest education level: Some college, no degree  Occupational History   Not on file  Tobacco Use   Smoking status: Former    Types: Cigarettes    Quit date: 06/12/2015    Years since quitting: 7.6   Smokeless tobacco: Never  Vaping Use   Vaping Use: Never used  Substance and Sexual Activity   Alcohol use: No    Alcohol/week: 0.0 standard drinks of alcohol   Drug use: Yes    Types: Marijuana    Comment: pt smokes twice a week. Each time 2 joints.   Sexual activity: Yes    Partners: Female  Other Topics Concern   Not on file  Social History Narrative   Not on file   Social Determinants of Health   Financial Resource Strain: High Risk (10/21/2022)   Overall Financial Resource Strain (CARDIA)    Difficulty of Paying Living Expenses: Hard  Food Insecurity: Food Insecurity Present (10/21/2022)   Hunger Vital Sign    Worried About Running Out of Food in the Last Year: Often true  Ran Out of Food in the Last Year: Never true  Transportation Needs: No Transportation Needs (10/21/2022)   PRAPARE - Administrator, Civil Service (Medical): No    Lack of Transportation (Non-Medical): No  Physical Activity: Insufficiently Active (10/21/2022)   Exercise Vital Sign    Days of Exercise per Week: 4 days    Minutes of Exercise per Session: 30 min  Stress: Stress Concern Present (10/21/2022)   [employer] of Occupational Health - Occupational Stress Questionnaire    Feeling of Stress : Very much  Social Connections: Socially Isolated (10/21/2022)   Social Connection and Isolation Panel [NHANES]    Frequency of Communication with Friends and Family: Twice a week    Frequency of  Social Gatherings with Friends and Family: Twice a week    Attends Religious Services: Never    Database administrator or Organizations: No    Attends Banker Meetings: Never    Marital Status: Divorced  Catering manager Violence: Not At Risk (10/12/2022)   Humiliation, Afraid, Rape, and Kick questionnaire    Fear of Current or Ex-Partner: No    Emotionally Abused: No    Physically Abused: No    Sexually Abused: No    FAMILY HISTORY: Family History  Problem Relation Age of Onset   Breast cancer Mother    Heart disease Mother    Blindness Father    Heart disease Brother    Heart disease Maternal Grandmother    Hypertension Maternal Grandmother    Cancer Maternal Grandfather        type unknown   Hypertension Other    CAD Other     ALLERGIES:  is allergic to naproxen.  MEDICATIONS:  Current Outpatient Medications  Medication Sig Dispense Refill   aspirin EC 81 MG tablet Take 81 mg by mouth daily. Swallow whole.     fluticasone (FLONASE) 50 MCG/ACT nasal spray Place 2 sprays into both nostrils daily. 16 g 1   losartan (COZAAR) 25 MG tablet Take 1 tablet (25 mg total) by mouth daily. (Patient taking differently: Take 25 mg by mouth daily. As needed) 30 tablet 3   pantoprazole (PROTONIX) 40 MG tablet Take 1 tablet (40 mg total) by mouth 2 (two) times daily. 180 tablet 3   sertraline (ZOLOFT) 25 MG tablet Take 1 tablet (25 mg total) by mouth daily. 30 tablet 3   sodium polystyrene (KAYEXALATE) 15 GM/60ML suspension 60 ml po daily for 4 days. 240 mL 0   SUMAtriptan (IMITREX) 50 MG tablet Take 1 tablet (50 mg total) by mouth every 2 (two) hours as needed for migraine. May repeat in 2 hours if headache persists or recurs.(max 2 tab in 24 hour period) 10 tablet 0   No current facility-administered medications for this visit.    REVIEW OF SYSTEMS:   Constitutional: ( - ) fevers, ( - )  chills , ( - ) night sweats Eyes: ( - ) blurriness of vision, ( - ) double vision, (  - ) watery eyes Ears, nose, mouth, throat, and face: ( - ) mucositis, ( - ) sore throat Respiratory: ( - ) cough, ( - ) dyspnea, ( - ) wheezes Cardiovascular: ( - ) palpitation, ( - ) chest discomfort, ( - ) lower extremity swelling Gastrointestinal:  ( - ) nausea, ( - ) heartburn, ( - ) change in bowel habits Skin: ( - ) abnormal skin rashes Lymphatics: ( - ) new lymphadenopathy, ( - ) easy  bruising Neurological: ( - ) numbness, ( - ) tingling, ( - ) new weaknesses Behavioral/Psych: ( - ) mood change, ( - ) new changes  All other systems were reviewed with the patient and are negative.  PHYSICAL EXAMINATION: Vitals:   01/15/23 1427  BP: (!) 145/82  Pulse: 76  Resp: 19  Temp: 98.2 F (36.8 C)  SpO2: 98%    Filed Weights   01/15/23 1427  Weight: 254 lb (115.2 kg)     GENERAL: Well-appearing middle-aged African-American male ,alert, no distress and comfortable SKIN: skin color, texture, turgor are normal, no rashes or significant lesions EYES: conjunctiva are pink and non-injected, sclera clear LUNGS: clear to auscultation and percussion with normal breathing effort HEART: regular rate & rhythm and no murmurs and no lower extremity edema Musculoskeletal: no cyanosis of digits and no clubbing  PSYCH: alert & oriented x 3, fluent speech NEURO: no focal motor/sensory deficits  LABORATORY DATA:  I have reviewed the data as listed    Latest Ref Rng & Units 01/15/2023    2:17 PM 11/05/2022    6:50 AM 10/12/2022   10:28 AM  CBC  WBC 4.0 - 10.5 K/uL 7.5  6.9  9.0   Hemoglobin 13.0 - 17.0 g/dL 16.1  09.6  04.5   Hematocrit 39.0 - 52.0 % 43.9  43.6  44.4   Platelets 150 - 400 K/uL 515  557  678        Latest Ref Rng & Units 01/15/2023    2:17 PM 10/12/2022   10:28 AM 09/29/2022    2:49 PM  CMP  Glucose 70 - 99 mg/dL 409  97  89   BUN 6 - 20 mg/dL 7  10  10    Creatinine 0.61 - 1.24 mg/dL 8.11  9.14  7.82   Sodium 135 - 145 mmol/L 140  143  140   Potassium 3.5 - 5.1 mmol/L 4.2   4.0  5.3 No hemolysis seen   Chloride 98 - 111 mmol/L 105  102  101   CO2 22 - 32 mmol/L 30  31  32   Calcium 8.9 - 10.3 mg/dL 9.5  9.6  95.6   Total Protein 6.5 - 8.1 g/dL 6.9  7.7  7.3   Total Bilirubin 0.3 - 1.2 mg/dL 0.5  0.4  0.3   Alkaline Phos 38 - 126 U/L 46  42  51   AST 15 - 41 U/L 25  24  21    ALT 0 - 44 U/L 29  39  43     RADIOGRAPHIC STUDIES: No results found.  ASSESSMENT & PLAN Naksh Radi 52 y.o. male with medical history significant for thrombocytosis who presents for a follow up visit.   At this time there is no clear explanation for the patient's thrombocytosis.  He has no evidence of an MPN, no evidence of inflammation, and no evidence of iron deficiency.  These of the 3 primary causes of thrombocytosis.  Additionally the patient is never had a splenectomy.  I would recommend because we are unclear on the cause that he continue his aspirin 81 mg p.o. daily.  The patient voices understanding of this plan.  We will plan to see him back in 6 months time for labs and 12 months time for repeat clinic visit.  # Thrombocytosis, stable.  # Relative Lymphocytosis, resolved --findings have been present since at least 2011, however JAK2 panel w/ reflex and BCR/ABL FISH were negative.  -- iron panel, ferritin,  and ESR were unremarkable --no sign of a driver mutation the patient, it does not appear that a bone marrow biopsy is necessary.  --recommend continuing ASA 81mg  PO at this time.  --Labs today show white blood cell count 7.5, hemoglobin 14.7, MCV 89.4, and platelets of 515 --RTC in 12 months clinic visit for continued monitoring  No orders of the defined types were placed in this encounter.   All questions were answered. The patient knows to call the clinic with any problems, questions or concerns.  A total of more than 25 minutes were spent on this encounter with face-to-face time and non-face-to-face time, including preparing to see the patient, ordering tests  and/or medications, counseling the patient and coordination of care as outlined above.   Ulysees Barns, MD Department of Hematology/Oncology [facility] Phone: [phone] Pager: [phone] Email: [email or web address]  01/26/2023 10:10 AM

## 2023-02-05 ENCOUNTER — Encounter: Payer: Self-pay | Admitting: Medical

## 2023-02-05 MED ORDER — PANTOPRAZOLE SODIUM 40 MG PO TBEC: 40.0000 mg | DELAYED_RELEASE_TABLET | Freq: Two times a day (BID) | ORAL | 3 refills | Status: AC

## 2023-02-11 ENCOUNTER — Telehealth: Payer: Self-pay

## 2023-02-11 NOTE — Telephone Encounter (Signed)
PA initiated via Covermymeds; KEY: [id number]. Awaiting determination.

## 2023-02-11 NOTE — Telephone Encounter (Signed)
PA approved.   [id number];[id number];Review Type:Qty;Coverage Start Date:02/11/2023;Coverage End Date:02/11/2024;

## 2023-02-15 ENCOUNTER — Telehealth: Payer: Self-pay | Admitting: Medical

## 2023-02-15 MED ORDER — SUCRALFATE 1 G PO TABS: 1.0000 g | ORAL_TABLET | Freq: Three times a day (TID) | ORAL | 1 refills | Status: DC

## 2023-02-15 NOTE — Telephone Encounter (Signed)
Sucraflate sent to pt pharmacy.

## 2023-03-11 ENCOUNTER — Ambulatory Visit (INDEPENDENT_AMBULATORY_CARE_PROVIDER_SITE_OTHER): Payer: Commercial Managed Care - HMO | Admitting: Medical

## 2023-03-11 VITALS — BP 136/80 | HR 70 | Temp 99.3°F | Resp 16 | Wt 258.0 lb

## 2023-03-11 DIAGNOSIS — F419 Anxiety disorder, unspecified: Secondary | ICD-10-CM | POA: Diagnosis not present

## 2023-03-11 DIAGNOSIS — M25521 Pain in right elbow: Secondary | ICD-10-CM

## 2023-03-11 DIAGNOSIS — K219 Gastro-esophageal reflux disease without esophagitis: Secondary | ICD-10-CM

## 2023-03-11 DIAGNOSIS — R739 Hyperglycemia, unspecified: Secondary | ICD-10-CM | POA: Diagnosis not present

## 2023-03-11 DIAGNOSIS — I10 Essential (primary) hypertension: Secondary | ICD-10-CM | POA: Diagnosis not present

## 2023-03-11 DIAGNOSIS — Z23 Encounter for immunization: Secondary | ICD-10-CM | POA: Diagnosis not present

## 2023-03-11 LAB — COMPREHENSIVE METABOLIC PANEL
ALT: 24 U/L (ref 0–53)
AST: 24 U/L (ref 0–37)
Albumin: 4.3 g/dL (ref 3.5–5.2)
Alkaline Phosphatase: 44 U/L (ref 39–117)
BUN: 9 mg/dL (ref 6–23)
CO2: 32 mEq/L (ref 19–32)
Calcium: 9.5 mg/dL (ref 8.4–10.5)
Chloride: 101 mEq/L (ref 96–112)
Creatinine, Ser: 1.06 mg/dL (ref 0.40–1.50)
GFR: 80.24 mL/min (ref >60.00)
Glucose, Bld: 82 mg/dL (ref 70–99)
Potassium: 4.5 mEq/L (ref 3.5–5.1)
Sodium: 138 mEq/L (ref 135–145)
Total Bilirubin: 0.5 mg/dL (ref 0.2–1.2)
Total Protein: 7 g/dL (ref 6.0–8.3)

## 2023-03-11 LAB — HEMOGLOBIN A1C: Hgb A1c MFr Bld: 6.1 % (ref 4.6–6.5)

## 2023-03-11 NOTE — Addendum Note (Signed)
Addended by: Wilford Corner on: 03/11/2023 10:32 AM   Modules accepted: Orders

## 2023-03-11 NOTE — Patient Instructions (Addendum)
1. Anxiety Discussed trial of low dose buspar 7.5 mg po twice daily.  2. Hypertension, unspecified type Bp mild high today. Borderline when you checked 140/90. My reading was close to 140/90 as well. Want you to check bp daily over next week and send me my chart message on those readings in one week. If closer to 140/90 then would recommend different bp med than you had used in past as your report side effect with losartan.  3. Gastroesophageal reflux disease, unspecified whether esophagitis present Continue protonix.   4. Right elbow pain - Ambulatory referral to Sports Medicine  5. Elevated blood sugar  Get A1c and cmp today.  Follow up one month or sooner if needed. Can be virtual visit to discuss how doing on anxiety med.

## 2023-03-11 NOTE — Progress Notes (Signed)
Subjective:    Patient ID: William Fuller, male    DOB: 11/10/1970, 52 y.o.   MRN: [id number]  HPI   Pt in or follow up.  Pt bp is 136/80 today. He checks once a week. Last reading he got was close to 140/90. Pt  was on losartan 25 mg daily. He states in past stopped due to making his stomach upset.  Pt states his gerd symptoms have been controlled. He is on protonix 40 mg twice a day. Last rx refill was delayed since taking twice a day.   Pt mood is better . Pt states he feels like he does not need. When he was using he states only used sporadically. But he now mentions some potential social anxiety when around people.  Pt in with rt lateral elbow/distal humerus pain for 3 weeks. 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 Also he thinks pain got worse with working out doing curls as he started back exercising. Pt is rt handed.   Elevated sugar in past- will get A1c and cmp today.  Review of Systems  Constitutional:  Negative for chills, fatigue and fever.  HENT:  Negative for congestion, drooling and facial swelling.   Respiratory:  Negative for cough, chest tightness, shortness of breath and wheezing.   Cardiovascular:  Negative for chest pain and palpitations.  Gastrointestinal:  Negative for abdominal pain, blood in stool, constipation, nausea and vomiting.  Genitourinary:  Negative for dysuria, flank pain and hematuria.  Musculoskeletal:  Negative for back pain and myalgias.  Skin:  Negative for rash.  Neurological:  Negative for dizziness, seizures, syncope, weakness and headaches.  Hematological:  Negative for adenopathy. Does not bruise/bleed easily.  Psychiatric/Behavioral:  Negative for behavioral problems, decreased concentration, hallucinations and sleep disturbance.    Past Medical History:  Diagnosis Date   Allergy    Esophageal varices (HCC)    Focal nodular hyperplasia of liver    GERD (gastroesophageal reflux disease)     Hypertension    Intestinal metaplasia of gastric mucosa    Lateral epicondylitis of left elbow 07/08/2021     Social History   Socioeconomic History   Marital status: Single    Spouse name: Not on file   Number of children: 2   Years of education: Not on file   Highest education level: Some college, no degree  Occupational History   Not on file  Tobacco Use   Smoking status: Former    Current packs/day: 0.00    Types: Cigarettes    Quit date: 06/12/2015    Years since quitting: 7.7   Smokeless tobacco: Never  Vaping Use   Vaping status: Never Used  Substance and Sexual Activity   Alcohol use: No    Alcohol/week: 0.0 standard drinks of alcohol   Drug use: Yes    Types: Marijuana    Comment: pt smokes twice a week. Each time 2 joints.   Sexual activity: Yes    Partners: Female  Other Topics Concern   Not on file  Social History Narrative   Not on file   Social Determinants of Health   Financial Resource Strain: High Risk (10/21/2022)   Overall Financial Resource Strain (CARDIA)    Difficulty of Paying Living Expenses: Hard  Food Insecurity: Food Insecurity Present (10/21/2022)   Hunger Vital Sign    Worried About Running Out of Food in the Last Year: Often true    Ran Out of Food in the Last Year:  Never true  Transportation Needs: No Transportation Needs (10/21/2022)   PRAPARE - Administrator, Civil Service (Medical): No    Lack of Transportation (Non-Medical): No  Physical Activity: Insufficiently Active (10/21/2022)   Exercise Vital Sign    Days of Exercise per Week: 4 days    Minutes of Exercise per Session: 30 min  Stress: Stress Concern Present (10/21/2022)   [employer] of Occupational Health - Occupational Stress Questionnaire    Feeling of Stress : Very much  Social Connections: Socially Isolated (10/21/2022)   Social Connection and Isolation Panel [NHANES]    Frequency of Communication with Friends and Family: Twice a week    Frequency of  Social Gatherings with Friends and Family: Twice a week    Attends Religious Services: Never    Database administrator or Organizations: No    Attends Banker Meetings: Never    Marital Status: Divorced  Catering manager Violence: Not At Risk (10/12/2022)   Humiliation, Afraid, Rape, and Kick questionnaire    Fear of Current or Ex-Partner: No    Emotionally Abused: No    Physically Abused: No    Sexually Abused: No    Past Surgical History:  Procedure Laterality Date   COLONOSCOPY     ESOPHAGOGASTRODUODENOSCOPY     HERNIA REPAIR     umbilical , as a child   IR BONE MARROW BIOPSY & ASPIRATION  11/05/2022    Family History  Problem Relation Age of Onset   Breast cancer Mother    Heart disease Mother    Blindness Father    Heart disease Brother    Heart disease Maternal Grandmother    Hypertension Maternal Grandmother    Cancer Maternal Grandfather        type unknown   Hypertension Other    CAD Other     Allergies  Allergen Reactions   Naproxen Itching and Rash    Current Outpatient Medications on File Prior to Visit  Medication Sig Dispense Refill   aspirin EC 81 MG tablet Take 81 mg by mouth daily. Swallow whole.     pantoprazole (PROTONIX) 40 MG tablet Take 1 tablet (40 mg total) by mouth 2 (two) times daily. 180 tablet 3   fluticasone (FLONASE) 50 MCG/ACT nasal spray Place 2 sprays into both nostrils daily. (Patient not taking: Reported on 03/11/2023) 16 g 1   losartan (COZAAR) 25 MG tablet Take 1 tablet (25 mg total) by mouth daily. (Patient not taking: Reported on 03/11/2023) 30 tablet 3   sertraline (ZOLOFT) 25 MG tablet Take 1 tablet (25 mg total) by mouth daily. (Patient not taking: Reported on 03/11/2023) 30 tablet 3   SUMAtriptan (IMITREX) 50 MG tablet Take 1 tablet (50 mg total) by mouth every 2 (two) hours as needed for migraine. May repeat in 2 hours if headache persists or recurs.(max 2 tab in 24 hour period) (Patient not taking: Reported on 03/11/2023)  10 tablet 0   No current facility-administered medications on file prior to visit.    BP 136/80 (BP Location: Right Arm, Patient Position: Sitting, Cuff Size: Large)   Pulse 70   Temp 99.3 F (37.4 C) (Oral)   Resp 16   Wt 258 lb (117 kg)   SpO2 98%   BMI 37.02 kg/m        Objective:   Physical Exam  General Mental Status- Alert. General Appearance- Not in acute distress.   Skin General: Color- Normal Color. Moisture- Normal Moisture.  Neck Carotid Arteries- Normal color. Moisture- Normal Moisture. No carotid bruits. No JVD.  Chest and Lung Exam Auscultation: Breath Sounds:-Normal.  Cardiovascular Auscultation:Rythm- Regular. Murmurs & Other Heart Sounds:Auscultation of the heart reveals- No Murmurs.  Abdomen Inspection:-Inspeection Normal. Palpation/Percussion:Note:No mass. Palpation and Percussion of the abdomen reveal- Non Tender, Non Distended + BS, no rebound or guarding.   Neurologic Cranial Nerve exam:- CN III-XII intact(No nystagmus), symmetric smile. Strength:- 5/5 equal and symmetric strength both upper and lower extremities.       Assessment & Plan:   Patient Instructions  1. Anxiety Discussed trial of low dose buspar 7.5 mg po twice daily.  2. Hypertension, unspecified type Bp mild high today. Borderline when you checked 140/90. My reading was close to 140/90 as well. Want you to check bp daily over next week and send me my chart message on those readings in one week. If closer to 140/90 then would recommend different bp med than you had used in past as your report side effect with losartan.  3. Gastroesophageal reflux disease, unspecified whether esophagitis present Continue protonix.   4. Right elbow pain - Ambulatory referral to Sports Medicine  5. Elevated blood sugar  Get A1c and cmp today.  Follow up one month or sooner if needed. Can be virtual visit to discuss how doing on anxiety med.     Esperanza Richters, PA-C

## 2023-03-18 ENCOUNTER — Other Ambulatory Visit: Payer: Self-pay | Admitting: *Deleted

## 2023-03-18 DIAGNOSIS — D75839 Thrombocytosis, unspecified: Secondary | ICD-10-CM

## 2023-03-18 DIAGNOSIS — D509 Iron deficiency anemia, unspecified: Secondary | ICD-10-CM

## 2023-03-19 ENCOUNTER — Inpatient Hospital Stay: Payer: Commercial Managed Care - HMO | Admitting: Hematology & Oncology

## 2023-03-19 ENCOUNTER — Inpatient Hospital Stay: Payer: Commercial Managed Care - HMO | Attending: Hematology & Oncology

## 2023-03-31 ENCOUNTER — Encounter (HOSPITAL_COMMUNITY): Payer: Self-pay | Admitting: Emergency Medicine

## 2023-03-31 ENCOUNTER — Ambulatory Visit (HOSPITAL_COMMUNITY)
Admission: EM | Admit: 2023-03-31 | Discharge: 2023-03-31 | Disposition: A | Discharge status: Home / Self Care | Payer: Commercial Managed Care - HMO | Attending: Family Medicine | Admitting: Family Medicine

## 2023-03-31 DIAGNOSIS — Z1152 Encounter for screening for COVID-19: Secondary | ICD-10-CM | POA: Diagnosis not present

## 2023-03-31 DIAGNOSIS — R519 Headache, unspecified: Secondary | ICD-10-CM | POA: Diagnosis present

## 2023-03-31 DIAGNOSIS — J029 Acute pharyngitis, unspecified: Secondary | ICD-10-CM | POA: Diagnosis not present

## 2023-03-31 LAB — SARS CORONAVIRUS 2 (TAT 6-24 HRS): SARS Coronavirus 2: NEGATIVE

## 2023-03-31 LAB — POCT RAPID STREP A (OFFICE): Rapid Strep A Screen: NEGATIVE

## 2023-03-31 MED ORDER — PREDNISONE 20 MG PO TABS: 40.0000 mg | ORAL_TABLET | Freq: Every day | ORAL | 0 refills | Status: AC

## 2023-03-31 MED ORDER — AZITHROMYCIN 250 MG PO TABS: ORAL_TABLET | ORAL | 0 refills | Status: DC

## 2023-03-31 NOTE — ED Triage Notes (Signed)
Patient c/o sore throat and headache x 3 days.  Afebrile.  Patient has taken Tylenol.

## 2023-03-31 NOTE — Discharge Instructions (Addendum)
Your COVID 19 results will be available in 24-48 hours.  Your results will automatically update to MyChart.  I am treating you for what appears to be a strep throat infection.  Take medication as prescribed.  The prednisone is only for 3 days to reduce the swelling of your tonsils and throat.  At any point you feel that your throat is not improving or that your throat is closing or you are unable to swallow this could be a medical emergency go immediately to the emergency department.  Otherwise some of your symptoms are improving complete entire course of medication and return to normal activities.

## 2023-03-31 NOTE — ED Provider Notes (Signed)
[facility]    CSN: [id number] Arrival date & time: 03/31/23  1023      History   Chief Complaint Chief Complaint  Patient presents with   Sore Throat    HPI William Fuller is a 52 y.o. male.    Sore Throat  Patient presents today with 3 days of worsening sore throat, headache and generally feeling unwell.  He is concerned for possible strep.  He has not had any fever.  He has no other URI symptoms.  Past Medical History:  Diagnosis Date   Allergy    Esophageal varices (HCC)    Focal nodular hyperplasia of liver    GERD (gastroesophageal reflux disease)    Hypertension    Intestinal metaplasia of gastric mucosa    Lateral epicondylitis of left elbow 07/08/2021    Patient Active Problem List   Diagnosis Date Noted   Closed displaced fracture of medial cuneiform of right foot 01/14/2022   Closed avulsion fracture of navicular bone of right foot 01/14/2022   Cervical radiculopathy 08/12/2021   Lateral epicondylitis of left elbow 07/08/2021   Subacromial bursitis of right shoulder joint 12/05/2020   Thrombocytosis 10/21/2018   Bilateral shoulder pain 09/15/2017    Past Surgical History:  Procedure Laterality Date   COLONOSCOPY     ESOPHAGOGASTRODUODENOSCOPY     HERNIA REPAIR     umbilical , as a child   IR BONE MARROW BIOPSY & ASPIRATION  11/05/2022       Home Medications    Prior to Admission medications   Medication Sig Start Date End Date Taking? Authorizing Provider  aspirin EC 81 MG tablet Take 81 mg by mouth daily. Swallow whole.   Yes [provider]  pantoprazole (PROTONIX) 40 MG tablet Take 1 tablet (40 mg total) by mouth 2 (two) times daily. 02/05/23  Yes Saguier, Ramon Dredge, PA-C  fluticasone (FLONASE) 50 MCG/ACT nasal spray Place 2 sprays into both nostrils daily. Patient not taking: Reported on 03/11/2023 09/29/22   Saguier, Ramon Dredge, PA-C  losartan (COZAAR) 25 MG tablet Take 1 tablet (25 mg total) by mouth daily. Patient not taking:  Reported on 03/11/2023 04/11/20   Saguier, Ramon Dredge, PA-C  sertraline (ZOLOFT) 25 MG tablet Take 1 tablet (25 mg total) by mouth daily. Patient not taking: Reported on 03/11/2023 09/29/22   Saguier, Ramon Dredge, PA-C  SUMAtriptan (IMITREX) 50 MG tablet Take 1 tablet (50 mg total) by mouth every 2 (two) hours as needed for migraine. May repeat in 2 hours if headache persists or recurs.(max 2 tab in 24 hour period) Patient not taking: Reported on 03/11/2023 08/12/18   Saguier, Ramon Dredge, PA-C    Family History Family History  Problem Relation Age of Onset   Breast cancer Mother    Heart disease Mother    Blindness Father    Heart disease Brother    Heart disease Maternal Grandmother    Hypertension Maternal Grandmother    Cancer Maternal Grandfather        type unknown   Hypertension Other    CAD Other     Social History Social History   Tobacco Use   Smoking status: Former    Current packs/day: 0.00    Types: Cigarettes    Quit date: 06/12/2015    Years since quitting: 7.8   Smokeless tobacco: Never  Vaping Use   Vaping status: Never Used  Substance Use Topics   Alcohol use: No    Alcohol/week: 0.0 standard drinks of alcohol   Drug  use: Yes    Types: Marijuana    Comment: pt smokes twice a week. Each time 2 joints.     Allergies   Naproxen   Review of Systems Review of Systems   Physical Exam Triage Vital Signs ED Triage Vitals  Encounter Vitals Group     BP 03/31/23 1130 137/89     Systolic BP Percentile --      Diastolic BP Percentile --      Pulse Rate 03/31/23 1130 65     Resp 03/31/23 1130 16     Temp 03/31/23 1130 98.7 F (37.1 C)     Temp Source 03/31/23 1130 Oral     SpO2 03/31/23 1130 96 %     Weight 03/31/23 1132 253 lb (114.8 kg)     Height 03/31/23 1132 5\' 10"  (1.778 m)     Head Circumference --      Peak Flow --      Pain Score 03/31/23 1132 8     Pain Loc --      Pain Education --      Exclude from Growth Chart --    No data found.  Updated Vital  Signs BP 137/89 (BP Location: Right Arm)   Pulse 65   Temp 98.7 F (37.1 C) (Oral)   Resp 16   Ht 5\' 10"  (1.778 m)   Wt 253 lb (114.8 kg)   SpO2 96%   BMI 36.30 kg/m   Visual Acuity Right Eye Distance:   Left Eye Distance:   Bilateral Distance:    Right Eye Near:   Left Eye Near:    Bilateral Near:     Physical Exam   UC Treatments / Results  Labs (all labs ordered are listed, but only abnormal results are displayed) Labs Reviewed  POCT RAPID STREP A (OFFICE)    EKG   Radiology No results found.  Procedures Procedures (including critical care time)  Medications Ordered in UC Medications - No data to display  Initial Impression / Assessment and Plan / UC Course  I have reviewed the triage vital signs and the nursing notes.  Pertinent labs & imaging results that were available during my care of the patient were reviewed by me and considered in my medical decision making (see chart for details).     *** Final Clinical Impressions(s) / UC Diagnoses   Final diagnoses:  None   Discharge Instructions   None    ED Prescriptions   None    PDMP not reviewed this encounter.

## 2023-09-26 ENCOUNTER — Encounter (HOSPITAL_COMMUNITY): Payer: Self-pay

## 2023-09-26 ENCOUNTER — Ambulatory Visit (HOSPITAL_COMMUNITY)
Admission: EM | Admit: 2023-09-26 | Discharge: 2023-09-26 | Disposition: A | Discharge status: Home / Self Care | Payer: Commercial Managed Care - HMO | Attending: Emergency Medicine | Admitting: Emergency Medicine

## 2023-09-26 DIAGNOSIS — J069 Acute upper respiratory infection, unspecified: Secondary | ICD-10-CM | POA: Diagnosis not present

## 2023-09-26 MED ORDER — GUAIFENESIN 400 MG PO TABS: 400.0000 mg | ORAL_TABLET | ORAL | 0 refills | Status: AC

## 2023-09-26 MED ORDER — BENZONATATE 100 MG PO CAPS: 100.0000 mg | ORAL_CAPSULE | Freq: Three times a day (TID) | ORAL | 0 refills | Status: DC | PRN

## 2023-09-26 MED ORDER — PROMETHAZINE-DM 6.25-15 MG/5ML PO SYRP: 5.0000 mL | ORAL_SOLUTION | Freq: Four times a day (QID) | ORAL | 0 refills | Status: AC | PRN

## 2023-09-26 NOTE — ED Triage Notes (Signed)
 Patient reports that he has had a headache, nasal congestion, a productive cough with cream colored sputum, dizziness x 3 days.  Patient states he has been taking Dayquil/Nyquil.

## 2023-09-26 NOTE — Discharge Instructions (Addendum)
 Guaifenesin -- 1 tablet every 4 hours with lots of fluids to loosen mucous (available OTC)  Tessalon -- cough pills can be taken 3 times daily  Promethazine DM -- cough syrup can be used up to 4 times daily. If this medication makes you drowsy, take only once before bed.

## 2023-09-26 NOTE — ED Provider Notes (Signed)
 [facility]    CSN: [id number] Arrival date & time: 09/26/23  1004      History   Chief Complaint Chief Complaint  Patient presents with   Headache   Nasal Congestion   Dizziness   Cough    HPI Taliesin Hartlage is a 53 y.o. male.  3 day history of nasal congestion, productive cough, headache Coughing makes him feel dizzy Has not had fever or chills, no body aches Tolerating fluids  Yesterday took dayquil and nyquil Reports sick contacts at work  Past Medical History:  Diagnosis Date   Allergy    Esophageal varices (HCC)    Focal nodular hyperplasia of liver    GERD (gastroesophageal reflux disease)    Hypertension    Intestinal metaplasia of gastric mucosa    Lateral epicondylitis of left elbow 07/08/2021    Patient Active Problem List   Diagnosis Date Noted   Closed displaced fracture of medial cuneiform of right foot 01/14/2022   Closed avulsion fracture of navicular bone of right foot 01/14/2022   Cervical radiculopathy 08/12/2021   Lateral epicondylitis of left elbow 07/08/2021   Subacromial bursitis of right shoulder joint 12/05/2020   Thrombocytosis 10/21/2018   Bilateral shoulder pain 09/15/2017    Past Surgical History:  Procedure Laterality Date   COLONOSCOPY     ESOPHAGOGASTRODUODENOSCOPY     HERNIA REPAIR     umbilical , as a child   IR BONE MARROW BIOPSY & ASPIRATION  11/05/2022       Home Medications    Prior to Admission medications   Medication Sig Start Date End Date Taking? Authorizing Provider  benzonatate (TESSALON) 100 MG capsule Take 1 capsule (100 mg total) by mouth 3 (three) times daily as needed for cough. 09/26/23  Yes Charlett Merkle, Lurena Joiner, PA-C  guaifenesin (HUMIBID E) 400 MG TABS tablet Take 1 tablet (400 mg total) by mouth every 4 (four) hours. 09/26/23  Yes Chee Dimon, Lurena Joiner, PA-C  promethazine-dextromethorphan (PROMETHAZINE-DM) 6.25-15 MG/5ML syrup Take 5 mLs by mouth 4 (four) times daily as needed for cough. 09/26/23  Yes  Verle Brillhart, Ray Church  aspirin EC 81 MG tablet Take 81 mg by mouth daily. Swallow whole.    [provider]  losartan (COZAAR) 25 MG tablet Take 1 tablet (25 mg total) by mouth daily. Patient not taking: Reported on 03/11/2023 04/11/20   Saguier, Ramon Dredge, PA-C  pantoprazole (PROTONIX) 40 MG tablet Take 1 tablet (40 mg total) by mouth 2 (two) times daily. 02/05/23   Saguier, Ramon Dredge, PA-C  sertraline (ZOLOFT) 25 MG tablet Take 1 tablet (25 mg total) by mouth daily. Patient not taking: Reported on 03/11/2023 09/29/22   Saguier, Ramon Dredge, PA-C  SUMAtriptan (IMITREX) 50 MG tablet Take 1 tablet (50 mg total) by mouth every 2 (two) hours as needed for migraine. May repeat in 2 hours if headache persists or recurs.(max 2 tab in 24 hour period) Patient not taking: Reported on 03/11/2023 08/12/18   Saguier, Ramon Dredge, PA-C    Family History Family History  Problem Relation Age of Onset   Breast cancer Mother    Heart disease Mother    Blindness Father    Heart disease Brother    Heart disease Maternal Grandmother    Hypertension Maternal Grandmother    Cancer Maternal Grandfather        type unknown   Hypertension Other    CAD Other     Social History Social History   Tobacco Use   Smoking status: Former  Current packs/day: 0.00    Types: Cigarettes    Quit date: 06/12/2015    Years since quitting: 8.2   Smokeless tobacco: Never  Vaping Use   Vaping status: Never Used  Substance Use Topics   Alcohol use: No    Alcohol/week: 0.0 standard drinks of alcohol   Drug use: Yes    Types: Marijuana     Allergies   Naproxen   Review of Systems Review of Systems Per HPI  Physical Exam Triage Vital Signs ED Triage Vitals  Encounter Vitals Group     BP 09/26/23 1017 (!) 143/87     Systolic BP Percentile --      Diastolic BP Percentile --      Pulse Rate 09/26/23 1017 72     Resp 09/26/23 1017 16     Temp 09/26/23 1017 98.8 F (37.1 C)     Temp Source 09/26/23 1017 Oral     SpO2  09/26/23 1017 96 %     Weight --      Height --      Head Circumference --      Peak Flow --      Pain Score 09/26/23 1019 6     Pain Loc --      Pain Education --      Exclude from Growth Chart --    No data found.  Updated Vital Signs BP (!) 143/87 (BP Location: Left Arm)   Pulse 72   Temp 98.8 F (37.1 C) (Oral)   Resp 16   SpO2 96%   Physical Exam Vitals and nursing note reviewed.  Constitutional:      General: He is not in acute distress.    Appearance: He is not ill-appearing.  HENT:     Right Ear: Tympanic membrane and ear canal normal.     Left Ear: Tympanic membrane and ear canal normal.     Nose: Congestion present. No rhinorrhea.     Mouth/Throat:     Mouth: Mucous membranes are moist.     Pharynx: Oropharynx is clear. No posterior oropharyngeal erythema.  Eyes:     Conjunctiva/sclera: Conjunctivae normal.  Cardiovascular:     Rate and Rhythm: Normal rate and regular rhythm.     Pulses: Normal pulses.     Heart sounds: Normal heart sounds.  Pulmonary:     Effort: Pulmonary effort is normal. No respiratory distress.     Breath sounds: Normal breath sounds. No wheezing, rhonchi or rales.     Comments: Wet sounding cough. Lungs clear throughout  Musculoskeletal:     Cervical back: Normal range of motion. No rigidity or tenderness.  Lymphadenopathy:     Cervical: No cervical adenopathy.  Skin:    General: Skin is warm and dry.  Neurological:     Mental Status: He is alert and oriented to person, place, and time.     UC Treatments / Results  Labs (all labs ordered are listed, but only abnormal results are displayed) Labs Reviewed - No data to display  EKG  Radiology No results found.  Procedures Procedures (including critical care time)  Medications Ordered in UC Medications - No data to display  Initial Impression / Assessment and Plan / UC Course  I have reviewed the triage vital signs and the nursing notes.  Pertinent labs & imaging  results that were available during my care of the patient were reviewed by me and considered in my medical decision making (see chart for details).  Afebrile, well appearing, clear lungs. Sating 96% room air Discussed viral etiology, recommend guaifenesin, tessalon, promethazine DM. Advised trying supportive care for next 5-6 days and monitoring symptoms. Can return if any acute worsening. Note for work is provided. Patient agrees to plan, no questions   Final Clinical Impressions(s) / UC Diagnoses   Final diagnoses:  Viral URI with cough     Discharge Instructions      Guaifenesin -- 1 tablet every 4 hours with lots of fluids to loosen mucous (available OTC)  Tessalon -- cough pills can be taken 3 times daily  Promethazine DM -- cough syrup can be used up to 4 times daily. If this medication makes you drowsy, take only once before bed.     ED Prescriptions     Medication Sig Dispense Auth. Provider   promethazine-dextromethorphan (PROMETHAZINE-DM) 6.25-15 MG/5ML syrup Take 5 mLs by mouth 4 (four) times daily as needed for cough. 240 mL Clarke Amburn, PA-C   benzonatate (TESSALON) 100 MG capsule Take 1 capsule (100 mg total) by mouth 3 (three) times daily as needed for cough. 30 capsule Terilyn Sano, PA-C   guaifenesin (HUMIBID E) 400 MG TABS tablet Take 1 tablet (400 mg total) by mouth every 4 (four) hours. 30 tablet Cypress Fanfan, Lurena Joiner, PA-C      PDMP not reviewed this encounter.   Jesper Stirewalt, Lurena Joiner, [place] 09/26/23 1043

## 2024-01-21 ENCOUNTER — Other Ambulatory Visit: Payer: Commercial Managed Care - HMO

## 2024-01-21 ENCOUNTER — Ambulatory Visit: Payer: Commercial Managed Care - HMO | Admitting: Hematology and Oncology

## 2024-01-28 ENCOUNTER — Other Ambulatory Visit: Payer: Self-pay | Admitting: Hematology and Oncology

## 2024-01-28 ENCOUNTER — Inpatient Hospital Stay: Payer: Self-pay | Attending: Hematology and Oncology

## 2024-01-28 ENCOUNTER — Inpatient Hospital Stay: Payer: Self-pay | Admitting: Hematology and Oncology

## 2024-01-28 DIAGNOSIS — D75839 Thrombocytosis, unspecified: Secondary | ICD-10-CM

## 2024-01-28 NOTE — Progress Notes (Deleted)
 [facility] [phone]   [phone]  PROGRESS NOTE  Patient Care Team: Dorina Dallas RIGGERS as PCP - General (Internal Medicine)  Hematological/Oncological History # Thrombocytosis # Relative Lymphocytosis 10/18/2009: WBC 8.9, Hgb 15.5, MCV 93.5, Plt 578 (first CBC on record) 02/22/2015: WBC 5.9, Hgb 14.0, MCV 89.7, Plt 478 10/07/2018: WBC 7.2, Hgb 14.8, MCV 90.7, Plt 755 09/26/2020: WBC 7.7, Hgb 14.8, MCV 92, Plt 545 10/10/2020: establish care with Dr. Federico. Plt 525 01/13/2021: Plt 539   Interval History:  William Fuller 53 y.o. male with medical history significant for thrombocytosis who presents for a follow up visit. The patient's last visit was on 01/14/2022. In the interim since the last visit Mr. Brandis has had no changes in his health.   On exam today [name] reports he has been well overall in the interim since her last visit 1 year ago.  He had no major changes in his health.  He reports it is steady as it goes.  He notes his energy levels are good and he continues to work without difficulty.  He is able to do his day-to-day activities without any trouble.  He notes that his appetite has been good and his weight has been steady.  He has been having some issues with acid reflux and did undergo an EGD last month.  He continues to take a baby aspirin once daily.  Overall he has no questions concerns or complaints today.  He is at his baseline.  He currently denies any fevers, chills, sweats, nausea, vomiting or diarrhea.  Full 10 point ROS is listed below.  MEDICAL HISTORY:  Past Medical History:  Diagnosis Date   Allergy    Esophageal varices (HCC)    Focal nodular hyperplasia of liver    GERD (gastroesophageal reflux disease)    Hypertension    Intestinal metaplasia of gastric mucosa    Lateral epicondylitis of left elbow 07/08/2021    SURGICAL HISTORY: Past Surgical History:  Procedure Laterality Date   COLONOSCOPY      ESOPHAGOGASTRODUODENOSCOPY     HERNIA REPAIR     umbilical , as a child   IR BONE MARROW BIOPSY & ASPIRATION  11/05/2022    SOCIAL HISTORY: Social History   Socioeconomic History   Marital status: Single    Spouse name: Not on file   Number of children: 2   Years of education: Not on file   Highest education level: Some college, no degree  Occupational History   Not on file  Tobacco Use   Smoking status: Former    Current packs/day: 0.00    Types: Cigarettes    Quit date: 06/12/2015    Years since quitting: 8.6   Smokeless tobacco: Never  Vaping Use   Vaping status: Never Used  Substance and Sexual Activity   Alcohol use: No    Alcohol/week: 0.0 standard drinks of alcohol   Drug use: Yes    Types: Marijuana   Sexual activity: Yes    Partners: Female  Other Topics Concern   Not on file  Social History Narrative   Not on file   Social Drivers of Health   Financial Resource Strain: High Risk (10/21/2022)   Overall Financial Resource Strain (CARDIA)    Difficulty of Paying Living Expenses: Hard  Food Insecurity: Food Insecurity Present (10/21/2022)   Hunger Vital Sign    Worried About Running Out of Food in the Last Year: Often true    Ran Out of Food  in the Last Year: Never true  Transportation Needs: No Transportation Needs (10/21/2022)   PRAPARE - Administrator, Civil Service (Medical): No    Lack of Transportation (Non-Medical): No  Physical Activity: Insufficiently Active (10/21/2022)   Exercise Vital Sign    Days of Exercise per Week: 4 days    Minutes of Exercise per Session: 30 min  Stress: Stress Concern Present (10/21/2022)   [employer] of Occupational Health - Occupational Stress Questionnaire    Feeling of Stress : Very much  Social Connections: Socially Isolated (10/21/2022)   Social Connection and Isolation Panel    Frequency of Communication with Friends and Family: Twice a week    Frequency of Social Gatherings with Friends and  Family: Twice a week    Attends Religious Services: Never    Database administrator or Organizations: No    Attends Banker Meetings: Never    Marital Status: Divorced  Catering manager Violence: Not At Risk (10/12/2022)   Humiliation, Afraid, Rape, and Kick questionnaire    Fear of Current or Ex-Partner: No    Emotionally Abused: No    Physically Abused: No    Sexually Abused: No    FAMILY HISTORY: Family History  Problem Relation Age of Onset   Breast cancer Mother    Heart disease Mother    Blindness Father    Heart disease Brother    Heart disease Maternal Grandmother    Hypertension Maternal Grandmother    Cancer Maternal Grandfather        type unknown   Hypertension Other    CAD Other     ALLERGIES:  is allergic to naproxen .  MEDICATIONS:  Current Outpatient Medications  Medication Sig Dispense Refill   aspirin EC 81 MG tablet Take 81 mg by mouth daily. Swallow whole.     benzonatate  (TESSALON ) 100 MG capsule Take 1 capsule (100 mg total) by mouth 3 (three) times daily as needed for cough. 30 capsule 0   guaifenesin  (HUMIBID E) 400 MG TABS tablet Take 1 tablet (400 mg total) by mouth every 4 (four) hours. 30 tablet 0   losartan  (COZAAR ) 25 MG tablet Take 1 tablet (25 mg total) by mouth daily. (Patient not taking: Reported on 03/11/2023) 30 tablet 3   pantoprazole  (PROTONIX ) 40 MG tablet Take 1 tablet (40 mg total) by mouth 2 (two) times daily. 180 tablet 3   promethazine -dextromethorphan (PROMETHAZINE -DM) 6.25-15 MG/5ML syrup Take 5 mLs by mouth 4 (four) times daily as needed for cough. 240 mL 0   sertraline  (ZOLOFT ) 25 MG tablet Take 1 tablet (25 mg total) by mouth daily. (Patient not taking: Reported on 03/11/2023) 30 tablet 3   SUMAtriptan  (IMITREX ) 50 MG tablet Take 1 tablet (50 mg total) by mouth every 2 (two) hours as needed for migraine. May repeat in 2 hours if headache persists or recurs.(max 2 tab in 24 hour period) (Patient not taking: Reported on  03/11/2023) 10 tablet 0   No current facility-administered medications for this visit.    REVIEW OF SYSTEMS:   Constitutional: ( - ) fevers, ( - )  chills , ( - ) night sweats Eyes: ( - ) blurriness of vision, ( - ) double vision, ( - ) watery eyes Ears, nose, mouth, throat, and face: ( - ) mucositis, ( - ) sore throat Respiratory: ( - ) cough, ( - ) dyspnea, ( - ) wheezes Cardiovascular: ( - ) palpitation, ( - ) chest discomfort, ( - )  lower extremity swelling Gastrointestinal:  ( - ) nausea, ( - ) heartburn, ( - ) change in bowel habits Skin: ( - ) abnormal skin rashes Lymphatics: ( - ) new lymphadenopathy, ( - ) easy bruising Neurological: ( - ) numbness, ( - ) tingling, ( - ) new weaknesses Behavioral/Psych: ( - ) mood change, ( - ) new changes  All other systems were reviewed with the patient and are negative.  PHYSICAL EXAMINATION: There were no vitals filed for this visit.   There were no vitals filed for this visit.    GENERAL: Well-appearing middle-aged African-American male ,alert, no distress and comfortable SKIN: skin color, texture, turgor are normal, no rashes or significant lesions EYES: conjunctiva are pink and non-injected, sclera clear LUNGS: clear to auscultation and percussion with normal breathing effort HEART: regular rate & rhythm and no murmurs and no lower extremity edema Musculoskeletal: no cyanosis of digits and no clubbing  PSYCH: alert & oriented x 3, fluent speech NEURO: no focal motor/sensory deficits  LABORATORY DATA:  I have reviewed the data as listed    Latest Ref Rng & Units 01/15/2023    2:17 PM 11/05/2022    6:50 AM 10/12/2022   10:28 AM  CBC  WBC 4.0 - 10.5 K/uL 7.5  6.9  9.0   Hemoglobin 13.0 - 17.0 g/dL 85.2  85.6  85.8   Hematocrit 39.0 - 52.0 % 43.9  43.6  44.4   Platelets 150 - 400 K/uL 515  557  678        Latest Ref Rng & Units 03/11/2023   10:24 AM 01/15/2023    2:17 PM 10/12/2022   10:28 AM  CMP  Glucose 70 - 99 mg/dL 82   893  97   BUN 6 - 23 mg/dL 9  7  10    Creatinine 0.40 - 1.50 mg/dL 8.93  8.94  8.82   Sodium 135 - 145 mEq/L 138  140  143   Potassium 3.5 - 5.1 mEq/L 4.5  4.2  4.0   Chloride 96 - 112 mEq/L 101  105  102   CO2 19 - 32 mEq/L 32  30  31   Calcium 8.4 - 10.5 mg/dL 9.5  9.5  9.6   Total Protein 6.0 - 8.3 g/dL 7.0  6.9  7.7   Total Bilirubin 0.2 - 1.2 mg/dL 0.5  0.5  0.4   Alkaline Phos 39 - 117 U/L 44  46  42   AST 0 - 37 U/L 24  25  24    ALT 0 - 53 U/L 24  29  39     RADIOGRAPHIC STUDIES: No results found.  ASSESSMENT & PLAN Hurbert Duran 53 y.o. male with medical history significant for thrombocytosis who presents for a follow up visit.   At this time there is no clear explanation for the patient's thrombocytosis.  He has no evidence of an MPN, no evidence of inflammation, and no evidence of iron deficiency.  These of the 3 primary causes of thrombocytosis.  Additionally the patient is never had a splenectomy.  I would recommend because we are unclear on the cause that he continue his aspirin 81 mg p.o. daily.  The patient voices understanding of this plan.  We will plan to see him back in 6 months time for labs and 12 months time for repeat clinic visit.  # Thrombocytosis, stable.  # Relative Lymphocytosis, resolved --findings have been present since at least 2011, however JAK2 panel w/  reflex and BCR/ABL FISH were negative.  -- iron panel, ferritin, and ESR were unremarkable --no sign of a driver mutation the patient, it does not appear that a bone marrow biopsy is necessary.  --recommend continuing ASA 81mg  PO at this time.  --Labs today show white blood cell count 7.5, hemoglobin 14.7, MCV 89.4, and platelets of 515 --RTC in 12 months clinic visit for continued monitoring  No orders of the defined types were placed in this encounter.   All questions were answered. The patient knows to call the clinic with any problems, questions or concerns.  A total of more than 25 minutes  were spent on this encounter with face-to-face time and non-face-to-face time, including preparing to see the patient, ordering tests and/or medications, counseling the patient and coordination of care as outlined above.   Norleen IVAR Kidney, MD Department of Hematology/Oncology [facility] Phone: [phone] Pager: [phone] Email: [email or web address]  01/28/2024 7:40 AM

## 2024-02-24 ENCOUNTER — Ambulatory Visit (HOSPITAL_COMMUNITY)
Admission: EM | Admit: 2024-02-24 | Discharge: 2024-02-24 | Disposition: A | Discharge status: Home / Self Care | Attending: Emergency Medicine | Admitting: Emergency Medicine

## 2024-02-24 ENCOUNTER — Encounter (HOSPITAL_COMMUNITY): Payer: Self-pay | Admitting: *Deleted

## 2024-02-24 DIAGNOSIS — R051 Acute cough: Secondary | ICD-10-CM

## 2024-02-24 DIAGNOSIS — U071 COVID-19: Secondary | ICD-10-CM | POA: Diagnosis not present

## 2024-02-24 LAB — POC SARS CORONAVIRUS 2 AG -  ED: SARS Coronavirus 2 Ag: POSITIVE — AB

## 2024-02-24 MED ORDER — AZELASTINE HCL 0.1 % NA SOLN: 2.0000 | Freq: Two times a day (BID) | NASAL | 0 refills | Status: AC

## 2024-02-24 MED ORDER — BENZONATATE 100 MG PO CAPS: 100.0000 mg | ORAL_CAPSULE | Freq: Three times a day (TID) | ORAL | 0 refills | Status: AC

## 2024-02-24 MED ORDER — PAXLOVID (300/100) 20 X 150 MG & 10 X 100MG PO TBPK: 3.0000 | ORAL_TABLET | Freq: Two times a day (BID) | ORAL | 0 refills | Status: AC

## 2024-02-24 NOTE — ED Triage Notes (Signed)
 Pt states he has been having cough, diarrhea, body aches since Tuesday. He has been taking OTC meds and promethazine  cough meds from a previous visit. He states he took an at home COVID test today and it was positive but he would like another one done since his at home expired in 2022.

## 2024-02-24 NOTE — Discharge Instructions (Addendum)
 You tested positive for COVID today. I have sent in Paxlovid  for you to take over the next 5 days.  This is the antiviral medication for COVID.  Take this as instructed on the package. You can take Tessalon  every 8 hours as needed for cough. You can also use azelastine  nasal spray twice daily to help with congestion. You can continue with over-the-counter Mucinex  as well as previously prescribed promethazine  DM cough syrup for your symptoms. Take 650 mg of Tylenol  every 6-8 hours as needed for any pain or fever. Make sure you are staying hydrated and getting plenty of rest. Follow-up with your primary care provider or return here as needed.

## 2024-02-24 NOTE — ED Provider Notes (Addendum)
 [facility]    CSN: [id number] Arrival date & time: 02/24/24  1336      History   Chief Complaint Chief Complaint  Patient presents with   Cough   Fatigue   Generalized Body Aches   Diarrhea   Covid Positive    HPI William Fuller is a 53 y.o. male.   Patient presents with cough, nasal congestion, body aches, chills, and intermittent diarrhea that began on 7/22.  Patient states that he has been taking Mucinex  and Promethazine  DM cough syrup with relief.  Patient had an at home COVID test that was positive and wanted to be retested here to confirm COVID diagnosis.    Patient denies any known sick exposures. Denies chest pain, shortness of breath, abdominal pain, known fever, nausea, vomiting, and weakness.  Past medical history includes hypertension, GERD, and esophageal varices.  The history is provided by the patient and medical records.  Cough Diarrhea   Past Medical History:  Diagnosis Date   Allergy    Esophageal varices (HCC)    Focal nodular hyperplasia of liver    GERD (gastroesophageal reflux disease)    Hypertension    Intestinal metaplasia of gastric mucosa    Lateral epicondylitis of left elbow 07/08/2021    Patient Active Problem List   Diagnosis Date Noted   Closed displaced fracture of medial cuneiform of right foot 01/14/2022   Closed avulsion fracture of navicular bone of right foot 01/14/2022   Cervical radiculopathy 08/12/2021   Lateral epicondylitis of left elbow 07/08/2021   Subacromial bursitis of right shoulder joint 12/05/2020   Thrombocytosis 10/21/2018   Bilateral shoulder pain 09/15/2017    Past Surgical History:  Procedure Laterality Date   COLONOSCOPY     ESOPHAGOGASTRODUODENOSCOPY     HERNIA REPAIR     umbilical , as a child   IR BONE MARROW BIOPSY & ASPIRATION  11/05/2022       Home Medications    Prior to Admission medications   Medication Sig Start Date End Date Taking? Authorizing Provider  aspirin EC 81 MG  tablet Take 81 mg by mouth daily. Swallow whole.   Yes [provider]  azelastine  (ASTELIN ) 0.1 % nasal spray Place 2 sprays into both nostrils 2 (two) times daily. Use in each nostril as directed 02/24/24  Yes Johnie Flaming A, NP  benzonatate  (TESSALON ) 100 MG capsule Take 1 capsule (100 mg total) by mouth every 8 (eight) hours. 02/24/24  Yes Johnie, Donyae Kilner A, NP  guaifenesin  (HUMIBID E) 400 MG TABS tablet Take 1 tablet (400 mg total) by mouth every 4 (four) hours. 09/26/23  Yes Rising, Asberry, PA-C  losartan  (COZAAR ) 25 MG tablet Take 1 tablet (25 mg total) by mouth daily. 04/11/20  Yes Saguier, [place], PA-C  nirmatrelvir/ritonavir (PAXLOVID , 300/100,) 20 x 150 MG & 10 x 100MG  TBPK Take 3 tablets by mouth 2 (two) times daily for 5 days. Patient GFR is 80.Take nirmatrelvir (150 mg) two tablets twice daily for 5 days and ritonavir (100 mg) one tablet twice daily for 5 days. 02/24/24 02/29/24 Yes Johnie, Skarlett Sedlacek A, NP  pantoprazole  (PROTONIX ) 40 MG tablet Take 1 tablet (40 mg total) by mouth 2 (two) times daily. 02/05/23  Yes Saguier, [place], PA-C  promethazine -dextromethorphan (PROMETHAZINE -DM) 6.25-15 MG/5ML syrup Take 5 mLs by mouth 4 (four) times daily as needed for cough. 09/26/23  Yes Rising, Asberry, PA-C  sertraline  (ZOLOFT ) 25 MG tablet Take 1 tablet (25 mg total) by mouth daily. 09/29/22  Yes Saguier,  [place], PA-C  SUMAtriptan  (IMITREX ) 50 MG tablet Take 1 tablet (50 mg total) by mouth every 2 (two) hours as needed for migraine. May repeat in 2 hours if headache persists or recurs.(max 2 tab in 24 hour period) 08/12/18  Yes Saguier, [place], PA-C    Family History Family History  Problem Relation Age of Onset   Breast cancer Mother    Heart disease Mother    Blindness Father    Heart disease Brother    Heart disease Maternal Grandmother    Hypertension Maternal Grandmother    Cancer Maternal Grandfather        type unknown   Hypertension Other    CAD Other     Social  History Social History   Tobacco Use   Smoking status: Former    Current packs/day: 0.00    Types: Cigarettes    Quit date: 06/12/2015    Years since quitting: 8.7   Smokeless tobacco: Never  Vaping Use   Vaping status: Never Used  Substance Use Topics   Alcohol use: No    Alcohol/week: 0.0 standard drinks of alcohol   Drug use: Yes    Types: Marijuana     Allergies   Naproxen    Review of Systems Review of Systems  Respiratory:  Positive for cough.   Gastrointestinal:  Positive for diarrhea.   Per HPI  Physical Exam Triage Vital Signs ED Triage Vitals  Encounter Vitals Group     BP 02/24/24 1359 (!) 142/93     Girls Systolic BP Percentile --      Girls Diastolic BP Percentile --      Boys Systolic BP Percentile --      Boys Diastolic BP Percentile --      Pulse Rate 02/24/24 1359 64     Resp 02/24/24 1359 18     Temp 02/24/24 1359 99.6 F (37.6 C)     Temp Source 02/24/24 1359 Oral     SpO2 02/24/24 1359 97 %     Weight --      Height --      Head Circumference --      Peak Flow --      Pain Score 02/24/24 1358 0     Pain Loc --      Pain Education --      Exclude from Growth Chart --    No data found.  Updated Vital Signs BP (!) 142/93 (BP Location: Right Arm)   Pulse 64   Temp 99.6 F (37.6 C) (Oral)   Resp 18   SpO2 97%   Visual Acuity Right Eye Distance:   Left Eye Distance:   Bilateral Distance:    Right Eye Near:   Left Eye Near:    Bilateral Near:     Physical Exam Vitals and nursing note reviewed.  Constitutional:      General: He is awake. He is not in acute distress.    Appearance: Normal appearance. He is well-developed and well-groomed. He is not ill-appearing.  HENT:     Right Ear: Tympanic membrane, ear canal and external ear normal.     Left Ear: Tympanic membrane, ear canal and external ear normal.     Nose: Congestion and rhinorrhea present.     Mouth/Throat:     Mouth: Mucous membranes are moist.     Pharynx:  Posterior oropharyngeal erythema and postnasal drip present. No oropharyngeal exudate.  Cardiovascular:     Rate and Rhythm: Normal rate and  regular rhythm.  Pulmonary:     Effort: Pulmonary effort is normal.     Breath sounds: Normal breath sounds.  Abdominal:     General: Bowel sounds are normal. There is no distension.     Palpations: Abdomen is soft.     Tenderness: There is no abdominal tenderness. There is no guarding or rebound.  Skin:    General: Skin is warm and dry.  Neurological:     Mental Status: He is alert.  Psychiatric:        Behavior: Behavior is cooperative.      UC Treatments / Results  Labs (all labs ordered are listed, but only abnormal results are displayed) Labs Reviewed  POC SARS CORONAVIRUS 2 AG -  ED - Abnormal; Notable for the following components:      Result Value   SARS Coronavirus 2 Ag Positive (*)    All other components within normal limits    EKG   Radiology No results found.  Procedures Procedures (including critical care time)  Medications Ordered in UC Medications - No data to display  Initial Impression / Assessment and Plan / UC Course  I have reviewed the triage vital signs and the nursing notes.  Pertinent labs & imaging results that were available during my care of the patient were reviewed by me and considered in my medical decision making (see chart for details).     Patient is overall well-appearing.  Vitals are stable.  Congestion and rhinorrhea are present, erythema and PND noted to pharynx.  Lungs clear bilaterally to auscultation.  COVID testing positive.  Prescribed Paxlovid .  Most recent GFR from August 2024 was 80.  Prescribed Tessalon  as needed for cough.  Prescribed azelastine  nasal spray to help with congestion.  Discussed over-the-counter medications as needed for symptoms.  Discussed follow-up and return precautions. Final Clinical Impressions(s) / UC Diagnoses   Final diagnoses:  COVID-19  Acute cough      Discharge Instructions      You tested positive for COVID today. I have sent in Paxlovid  for you to take over the next 5 days.  This is the antiviral medication for COVID.  Take this as instructed on the package. You can take Tessalon  every 8 hours as needed for cough. You can also use azelastine  nasal spray twice daily to help with congestion. You can continue with over-the-counter Mucinex  as well as previously prescribed promethazine  DM cough syrup for your symptoms. Take 650 mg of Tylenol  every 6-8 hours as needed for any pain or fever. Make sure you are staying hydrated and getting plenty of rest. Follow-up with your primary care provider or return here as needed.   ED Prescriptions     Medication Sig Dispense Auth. Provider   benzonatate  (TESSALON ) 100 MG capsule Take 1 capsule (100 mg total) by mouth every 8 (eight) hours. 21 capsule Johnie, Shine Mikes A, NP   nirmatrelvir/ritonavir (PAXLOVID , 300/100,) 20 x 150 MG & 10 x 100MG  TBPK Take 3 tablets by mouth 2 (two) times daily for 5 days. Patient GFR is 80.Take nirmatrelvir (150 mg) two tablets twice daily for 5 days and ritonavir (100 mg) one tablet twice daily for 5 days. 30 tablet Johnie Flaming A, NP   azelastine  (ASTELIN ) 0.1 % nasal spray Place 2 sprays into both nostrils 2 (two) times daily. Use in each nostril as directed 30 mL Johnie Flaming A, NP      PDMP not reviewed this encounter.   Johnie Flaming A, NP 02/24/24  1436    Johnie Flaming A, NP 02/24/24 1436
